# Patient Record
Sex: Male | Born: 1950 | Race: Black or African American | Hispanic: No | Marital: Married | Smoking: Never smoker
Health system: Southern US, Community
[De-identification: ages and names within clinical notes are randomized; demographics above are authoritative.]

## PROBLEM LIST (undated history)

## (undated) DIAGNOSIS — J302 Other seasonal allergic rhinitis: Secondary | ICD-10-CM

## (undated) DIAGNOSIS — E785 Hyperlipidemia, unspecified: Secondary | ICD-10-CM

## (undated) DIAGNOSIS — H35039 Hypertensive retinopathy, unspecified eye: Secondary | ICD-10-CM

## (undated) DIAGNOSIS — I10 Essential (primary) hypertension: Secondary | ICD-10-CM

## (undated) DIAGNOSIS — H269 Unspecified cataract: Secondary | ICD-10-CM

## (undated) DIAGNOSIS — N529 Male erectile dysfunction, unspecified: Secondary | ICD-10-CM

## (undated) HISTORY — DX: Essential (primary) hypertension: I10

## (undated) HISTORY — DX: Hypertensive retinopathy, unspecified eye: H35.039

## (undated) HISTORY — PX: KNEE ARTHROSCOPY: SUR90

## (undated) HISTORY — DX: Male erectile dysfunction, unspecified: N52.9

## (undated) HISTORY — DX: Hyperlipidemia, unspecified: E78.5

## (undated) HISTORY — DX: Unspecified cataract: H26.9

## (undated) NOTE — *Deleted (*Deleted)
[facility] - Clinic Note  02/17/2020     CHIEF COMPLAINT Patient presents for No chief complaint on file.   HISTORY OF PRESENT ILLNESS: Jeffrey Good is a 68 y.o. male who presents to the clinic today for:   pt started seeing new floaters in his right eye only about 3 weeks ago, he denies seeing any fol, pt saw Dr. Conley Rolls who said she saw blood in the back of his eye and referred him here, pt states he is on medication for BP  Referring physician: Deatra James, MD [phone] [address] [place],  [place] [address]  HISTORICAL INFORMATION:   Selected notes from the MEDICAL RECORD NUMBER Referred by Dr. Conley Rolls for eval of floaters/ischemia/heme OU.   CURRENT MEDICATIONS: No current outpatient medications on file. (Ophthalmic Drugs)   No current facility-administered medications for this visit. (Ophthalmic Drugs)   Current Outpatient Medications (Other)  Medication Sig  . acetaminophen (TYLENOL) 325 MG tablet Take 2 tablets (650 mg total) by mouth every 6 (six) hours as needed for mild pain (or temp > 100).  . carvedilol (COREG) 25 MG tablet Take 1 tablet (25 mg total) by mouth 2 (two) times daily.  . Cetirizine HCl (ZYRTEC ALLERGY) 10 MG CAPS Take 10 mg by mouth daily.   Marland Kitchen doxazosin (CARDURA XL) 4 MG 24 hr tablet Take 1 tablet (4 mg total) by mouth daily with breakfast.  . gabapentin (NEURONTIN) 300 MG capsule TAKE 1 CAPSULE BY MOUTH THREE TIMES A DAY  . HYDROcodone-acetaminophen (NORCO/VICODIN) 5-325 MG tablet Take 1-2 tablets by mouth as needed for moderate pain.  Marland Kitchen ibuprofen (ADVIL) 800 MG tablet Take 1 tablet (800 mg total) by mouth every 8 (eight) hours as needed.  . lamoTRIgine (LAMICTAL) 150 MG tablet TAKE 1 TABLET BY MOUTH TWICE A DAY  . lisinopril (ZESTRIL) 20 MG tablet Take 1 tablet (20 mg total) by mouth 2 (two) times daily.  . meloxicam (MOBIC) 7.5 MG tablet Take 1 tablet (7.5 mg total) by mouth daily.  . sildenafil (VIAGRA) 100 MG tablet One half  to one po prn before intercourse  . spironolactone (ALDACTONE) 25 MG tablet TAKE 1 TABLET BY MOUTH ONCE DAILY   No current facility-administered medications for this visit. (Other)      REVIEW OF SYSTEMS:    ALLERGIES Allergies  Allergen Reactions  . Pregabalin Nausea Only    PAST MEDICAL HISTORY Past Medical History:  Diagnosis Date  . Erectile dysfunction   . Hyperlipidemia   . Hypertension   . Seasonal allergies    Past Surgical History:  Procedure Laterality Date  . IR RADIOLOGIST EVAL & MGMT  07/16/2019  . IR RADIOLOGIST EVAL & MGMT  07/29/2019  . KNEE ARTHROSCOPY     1989 left  . LAPAROSCOPIC APPENDECTOMY N/A 10/15/2019   Procedure: LAPAROSCOPIC APPENDECTOMY;  Surgeon: Harriette Bouillon, MD;  Location: [facility] OR;  Service: General;  Laterality: N/A;    FAMILY HISTORY Family History  Problem Relation Age of Onset  . Hypertension Mother   . Diabetes Mother     SOCIAL HISTORY Social History   Tobacco Use  . Smoking status: Never Smoker  . Smokeless tobacco: Never Used  Vaping Use  . Vaping Use: Never used  Substance Use Topics  . Alcohol use: Yes    Alcohol/week: 1.0 standard drink    Types: 1 Cans of beer per week    Comment: very rare  . Drug use: No  OPHTHALMIC EXAM:  Not recorded     IMAGING AND PROCEDURES  Imaging and Procedures for 02/17/2020           ASSESSMENT/PLAN:    ICD-10-CM   1. Posterior vitreous detachment of right eye  H43.811   2. Retinal ischemia  H35.82   3. 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 Retinal edema  H35.81   5. Essential hypertension  I10   6. Hypertensive retinopathy of both eyes  H35.033   7. Combined forms of age-related cataract of both eyes  H25.813     1. PVD / vitreous syneresis OD  - 3 wk history of floaters OD, onset ~1st of Oct 2021  - Discussed findings and prognosis  - No RT or RD on 360 peripheral exam  - Reviewed s/s of RT/RD  - Strict return precautions  for any such RT/RD signs/symptoms  - f/u in 3-4 wks -- DFE/OCT  2,3. Retinal Ischemia, remote BRVO OD  - exam shows sclerotic vessels in SN peripheral quad  - FA 10.19.21 shows telangectasias w/ late leakge, vascular perfusion defects in superonasal peripheral quad -- suggestive of remote BRVO   - optic disc with nasal corkscrew vessel  - discussed findings, prognosis  - no retinal intervention indicated at this time, but pt may benefit from segmental PRP to areas of nonperfusion OD  - monitor  4. No retinal edema on exam or OCT  5,6. Hypertensive retinopathy OU - discussed importance of tight BP control - monitor  7. Mixed Cataract OU - The symptoms of cataract, surgical options, and treatments and risks were discussed with patient. - discussed diagnosis and progression - monitor  Ophthalmic Meds Ordered this visit:  No orders of the defined types were placed in this encounter.      No follow-ups on file.  There are no Patient Instructions on file for this visit.   Explained the diagnoses, plan, and follow up with the patient and they expressed understanding.  Patient expressed understanding of the importance of proper follow up care.   This document serves as a record of services personally performed by Karie Chimera, MD, PhD. It was created on their behalf by Annalee Genta, COMT. The creation of this record is the provider's dictation and/or activities during the visit.  Electronically signed by: Annalee Genta, COMT 02/16/20 10:07 AM     Karie Chimera, M.D., Ph.D. Diseases & Surgery of the Retina and Vitreous [facility]    Abbreviations: M myopia (nearsighted); A astigmatism; H hyperopia (farsighted); P presbyopia; Mrx spectacle prescription;  CTL contact lenses; OD right eye; OS left eye; OU both eyes  XT exotropia; ET esotropia; PEK punctate epithelial keratitis; PEE punctate epithelial erosions; DES dry eye syndrome; MGD meibomian gland  dysfunction; ATs artificial tears; PFAT's preservative free artificial tears; NSC nuclear sclerotic cataract; PSC posterior subcapsular cataract; ERM epi-retinal membrane; PVD posterior vitreous detachment; RD retinal detachment; DM diabetes mellitus; DR diabetic retinopathy; NPDR non-proliferative diabetic retinopathy; PDR proliferative diabetic retinopathy; CSME clinically significant macular edema; DME diabetic macular edema; dbh dot blot hemorrhages; CWS cotton wool spot; POAG primary open angle glaucoma; C/D cup-to-disc ratio; HVF humphrey visual field; GVF goldmann visual field; OCT optical coherence tomography; IOP intraocular pressure; BRVO Branch retinal vein occlusion; CRVO central retinal vein occlusion; CRAO central retinal artery occlusion; BRAO branch retinal artery occlusion; RT retinal tear; SB scleral buckle; PPV pars plana vitrectomy; VH Vitreous hemorrhage; PRP panretinal laser  photocoagulation; IVK intravitreal kenalog; VMT vitreomacular traction; MH Macular hole;  NVD neovascularization of the disc; NVE neovascularization elsewhere; AREDS age related eye disease study; ARMD age related macular degeneration; POAG primary open angle glaucoma; EBMD epithelial/anterior basement membrane dystrophy; ACIOL anterior chamber intraocular lens; IOL intraocular lens; PCIOL posterior chamber intraocular lens; Phaco/IOL phacoemulsification with intraocular lens placement; PRK photorefractive keratectomy; LASIK laser assisted in situ keratomileusis; HTN hypertension; DM diabetes mellitus; COPD chronic obstructive pulmonary disease 

---

## 2002-06-25 ENCOUNTER — Encounter: Payer: Self-pay | Admitting: Cardiovascular Disease

## 2003-05-28 ENCOUNTER — Encounter (INDEPENDENT_AMBULATORY_CARE_PROVIDER_SITE_OTHER): Payer: Self-pay | Admitting: *Deleted

## 2003-05-28 ENCOUNTER — Ambulatory Visit (HOSPITAL_COMMUNITY): Admission: RE | Admit: 2003-05-28 | Discharge: 2003-05-28 | Payer: Self-pay | Admitting: Gastroenterology

## 2009-12-08 ENCOUNTER — Ambulatory Visit: Payer: Self-pay | Admitting: Cardiovascular Disease

## 2010-07-06 ENCOUNTER — Telehealth: Payer: Self-pay | Admitting: Cardiovascular Disease

## 2010-07-06 ENCOUNTER — Encounter: Payer: Self-pay | Admitting: *Deleted

## 2010-07-06 DIAGNOSIS — I10 Essential (primary) hypertension: Secondary | ICD-10-CM | POA: Insufficient documentation

## 2010-07-06 DIAGNOSIS — N529 Male erectile dysfunction, unspecified: Secondary | ICD-10-CM | POA: Insufficient documentation

## 2010-07-06 MED ORDER — AMLODIPINE BESYLATE 10 MG PO TABS: 10.0000 mg | ORAL_TABLET | Freq: Every day | ORAL | Status: DC

## 2010-07-06 NOTE — Telephone Encounter (Signed)
norvasc 10mg  daily #90/3 refills [facility]; pt advised

## 2010-07-06 NOTE — Telephone Encounter (Signed)
PT SAID HAD CALLED AND ASK FOR REFILLS BUT ONE OF THE MEDS HE WAS TOLD NOT TO TAKE. HE IS NOT SURE WHICH ONE AND WHY. CHART PLACED IN YOUR BOX.

## 2010-08-14 ENCOUNTER — Encounter: Payer: Self-pay | Admitting: Cardiovascular Disease

## 2010-08-14 ENCOUNTER — Ambulatory Visit (INDEPENDENT_AMBULATORY_CARE_PROVIDER_SITE_OTHER): Payer: PRIVATE HEALTH INSURANCE | Admitting: Cardiovascular Disease

## 2010-08-14 VITALS — BP 118/74 | HR 72 | Wt 244.0 lb

## 2010-08-14 DIAGNOSIS — I10 Essential (primary) hypertension: Secondary | ICD-10-CM

## 2010-08-14 NOTE — Progress Notes (Signed)
Jeffrey Good Date of Birth  1950/07/19 [facility] [address]     [address] [place], [place]  [address] [phone]  Fax  [phone]  History of Present Illness:  Jeffrey Good is a middle-aged gentleman with history of hypertension and erectile dysfunction. He presents today for followup visit for his hypertension.  He has not had any episodes of chest pain or shortness breath. He's been trying to get some exercise.  Current Outpatient Prescriptions on File Prior to Visit  Medication Sig Dispense Refill  . carvedilol (COREG) 12.5 MG tablet Take 12.5 mg by mouth 2 (two) times daily with a meal.        . doxazosin (CARDURA) 4 MG tablet Take 4 mg by mouth at bedtime.        . hydrochlorothiazide 25 MG tablet Take 25 mg by mouth daily.        Marland Kitchen lisinopril (PRINIVIL,ZESTRIL) 40 MG tablet Take 40 mg by mouth daily.        . potassium chloride SA (K-DUR,KLOR-CON) 20 MEQ tablet Take 20 mEq by mouth 2 (two) times daily.        . tadalafil (CIALIS) 20 MG tablet Take 20 mg by mouth daily as needed.        Marland Kitchen DISCONTD: amLODipine (NORVASC) 10 MG tablet Take 1 tablet (10 mg total) by mouth daily.  90 tablet  3  . DISCONTD: simvastatin (ZOCOR) 40 MG tablet Take 40 mg by mouth at bedtime.          No Known Allergies  Past Medical History  Diagnosis Date  . Hypertension   . Erectile dysfunction   . Hyperlipidemia     Past Surgical History  Procedure Date  . Knee arthroscopy     1989 left    History  Smoking status  . Never Smoker   Smokeless tobacco  . Not on file    History  Alcohol Use  . 0.6 oz/week  . 1 Cans of beer per week    Family History  Problem Relation Age of Onset  . Hypertension Mother   . Diabetes Mother     Reviw of Systems:  Reviewed in the HPI.  All other systems are negative.  Physical Exam: BP 118/74  Pulse 72  Wt 244 lb (110.678 kg) The patient is alert and oriented x 3.  The mood and affect are normal.   The skin is warm and dry.  Color is normal.  The HEENT exam reveals that the sclera are nonicteric.  The mucous membranes are moist.  The carotids are 2+ without bruits.  There is no thyromegaly.  There is no JVD.  The lungs are clear.  The chest wall is non tender.  The heart exam reveals a regular rate with a normal S1 and S2.  There are no murmurs, gallops, or rubs.  The PMI is not displaced.   Abdominal exam reveals good bowel sounds.  There is no guarding or rebound.  There is no hepatosplenomegaly or tenderness.  There are no masses.  Exam of the legs reveal no clubbing, cyanosis, or edema.  The legs are without rashes.  The distal pulses are intact.  Cranial nerves II - XII are intact.  Motor and sensory functions are intact.  The gait is normal.  Assessment / Plan:

## 2010-08-14 NOTE — Assessment & Plan Note (Signed)
Jeffrey Good is doing very well from a cardiac standpoint. His blood pressure is well controlled. We'll continue with his same medications.

## 2010-08-23 ENCOUNTER — Telehealth: Payer: Self-pay | Admitting: Cardiovascular Disease

## 2010-08-23 NOTE — Telephone Encounter (Signed)
Wife is calling about the list of medications as compared to what he is taking from home.  [name] is just calling back to give you the information

## 2010-08-24 ENCOUNTER — Telehealth: Payer: Self-pay | Admitting: *Deleted

## 2010-08-24 MED ORDER — POTASSIUM CHLORIDE CRYS ER 20 MEQ PO TBCR: EXTENDED_RELEASE_TABLET | ORAL | Status: DC

## 2010-08-24 MED ORDER — CARVEDILOL 25 MG PO TABS: 25.0000 mg | ORAL_TABLET | Freq: Two times a day (BID) | ORAL | Status: DC

## 2010-08-24 NOTE — Telephone Encounter (Signed)
Wife update me with meds told her to stop and pull norvasc out of his bag of meds,  verbalized understanding. Change in doses recorded. Note the changes in coreg was 12.5 mg bid now 25mg  bid, no catapress since hosp dc, DC norvasc per last office visit. Alfonso Ramus RN

## 2010-08-25 NOTE — Op Note (Signed)
NAME:  Jeffrey Good, Jeffrey Good NO.:  [id number]   MEDICAL RECORD NO.:  [id number]                   PATIENT TYPE:  AMB   LOCATION:  ENDO                                 FACILITY:  [facility]   PHYSICIAN:  Graylin Shiver, M.D.                DATE OF BIRTH:  1950-11-16   DATE OF PROCEDURE:  05/28/2003  DATE OF DISCHARGE:                                 OPERATIVE REPORT   PROCEDURE:  Colonoscopy with polypectomy and biopsy.   INDICATIONS:  Rectal bleeding.   Informed consent was obtained after explanation of the risks of bleeding,  infection, and perforation.   PREMEDICATION:  Fentanyl 50 mcg IV, Versed 4 mg IV.   DESCRIPTION OF PROCEDURE:  With the patient in the left lateral decubitus  position, a rectal exam was performed.  No masses were felt.  The Olympus  colonoscope was inserted into the rectum and advanced around the colon to  the cecum.  Cecal landmarks were identified.  The cecum looked normal.  In  the midascending colon there was a 4-5 mm sessile polyp snared and removed  by snare cautery technique.  The cautery site looked good.  The polyp was  suctioned into the scope but we were not able to retrieve a specimen.  The  transverse colon looked normal, the descending colon looked normal, the  sigmoid looked normal.  In the rectum there were several small 2-3 mm polyps  biopsied off with cold forceps.  He tolerated the procedure well without  complications.   IMPRESSION:  Colon polyps.   PLAN:  The pathology will be checked.                                               Graylin Shiver, M.D.    Germain Osgood  D:  05/28/2003  T:  05/28/2003  Job:  [id number]   cc:   Vesta Mixer, M.D.  [address]  [place]  [place] [address]  Fax: [phone]

## 2010-10-09 ENCOUNTER — Telehealth: Payer: Self-pay | Admitting: Cardiovascular Disease

## 2010-10-09 MED ORDER — ATORVASTATIN CALCIUM 40 MG PO TABS: 40.0000 mg | ORAL_TABLET | Freq: Every day | ORAL | Status: DC

## 2010-10-09 NOTE — Telephone Encounter (Signed)
Called in because his insurance company told him that they would no longer be refilling his prescriptions through [facility] when his insurance changes on the first of next year and he wanted to change and have all of his [facility] at [facility] [address] [phone]. Please call back. I have pulled his chart.

## 2010-10-09 NOTE — Telephone Encounter (Signed)
Pt called and informed when [facility] needs [facility]

## 2010-11-20 ENCOUNTER — Telehealth: Payer: Self-pay | Admitting: Cardiovascular Disease

## 2010-11-20 MED ORDER — CARVEDILOL 25 MG PO TABS: 25.0000 mg | ORAL_TABLET | Freq: Two times a day (BID) | ORAL | Status: DC

## 2010-11-20 MED ORDER — HYDROCHLOROTHIAZIDE 25 MG PO TABS: 25.0000 mg | ORAL_TABLET | Freq: Every day | ORAL | Status: DC

## 2010-11-20 NOTE — Telephone Encounter (Signed)
Patient request refill. Done Jodette Izel Hochberg RN  

## 2010-11-20 NOTE — Telephone Encounter (Signed)
Wants to let you know that he is out of his medication and needs refills.

## 2010-12-04 ENCOUNTER — Telehealth: Payer: Self-pay | Admitting: Cardiovascular Disease

## 2010-12-04 MED ORDER — DOXAZOSIN MESYLATE 4 MG PO TABS: 4.0000 mg | ORAL_TABLET | Freq: Every day | ORAL | Status: DC

## 2010-12-04 NOTE — Telephone Encounter (Signed)
Error, message already sent and retrieved

## 2010-12-04 NOTE — Telephone Encounter (Signed)
Pt needs [facility] [address]  Please contact pharmacy to refill.  No need to call pt back.  He won't be able to pick it up until tomorrow.

## 2010-12-04 NOTE — Telephone Encounter (Signed)
Patient request refill. Done, Jodette Jaleena Viviani RN  

## 2010-12-04 NOTE — Telephone Encounter (Signed)
Looking for Chart °

## 2011-01-08 ENCOUNTER — Other Ambulatory Visit: Payer: Self-pay | Admitting: Cardiovascular Disease

## 2011-01-08 MED ORDER — LISINOPRIL 40 MG PO TABS: 40.0000 mg | ORAL_TABLET | Freq: Every day | ORAL | Status: DC

## 2011-04-04 ENCOUNTER — Other Ambulatory Visit: Payer: Self-pay | Admitting: *Deleted

## 2011-04-04 MED ORDER — ATORVASTATIN CALCIUM 40 MG PO TABS: 40.0000 mg | ORAL_TABLET | Freq: Every day | ORAL | Status: DC

## 2011-04-05 ENCOUNTER — Other Ambulatory Visit: Payer: Self-pay | Admitting: *Deleted

## 2011-05-21 ENCOUNTER — Other Ambulatory Visit: Payer: Self-pay | Admitting: *Deleted

## 2011-05-21 MED ORDER — CARVEDILOL 25 MG PO TABS: 25.0000 mg | ORAL_TABLET | Freq: Two times a day (BID) | ORAL | Status: DC

## 2011-05-21 MED ORDER — HYDROCHLOROTHIAZIDE 25 MG PO TABS: 25.0000 mg | ORAL_TABLET | Freq: Every day | ORAL | Status: DC

## 2011-05-29 ENCOUNTER — Other Ambulatory Visit: Payer: Self-pay | Admitting: *Deleted

## 2011-05-29 MED ORDER — DOXAZOSIN MESYLATE 4 MG PO TABS: 4.0000 mg | ORAL_TABLET | Freq: Every day | ORAL | Status: DC

## 2011-10-12 ENCOUNTER — Other Ambulatory Visit: Payer: Self-pay | Admitting: Cardiology

## 2011-10-12 MED ORDER — ATORVASTATIN CALCIUM 40 MG PO TABS: 40.0000 mg | ORAL_TABLET | Freq: Every day | ORAL | Status: DC

## 2011-11-22 ENCOUNTER — Other Ambulatory Visit: Payer: Self-pay | Admitting: Cardiovascular Disease

## 2011-11-22 MED ORDER — CARVEDILOL 25 MG PO TABS: 25.0000 mg | ORAL_TABLET | Freq: Two times a day (BID) | ORAL | Status: DC

## 2011-12-11 ENCOUNTER — Other Ambulatory Visit: Payer: Self-pay | Admitting: Cardiovascular Disease

## 2011-12-11 NOTE — Telephone Encounter (Signed)
Will give more refills after appointment Fax Received. Refill Completed. Joseluis Alessio Chowoe (R.M.A)

## 2011-12-24 ENCOUNTER — Other Ambulatory Visit: Payer: Self-pay | Admitting: Cardiovascular Disease

## 2011-12-24 NOTE — Telephone Encounter (Signed)
Pt needs appointment then refill can be made Fax Received. Refill Completed. Jeannie Mallinger Chowoe (R.M.A)   

## 2012-01-08 ENCOUNTER — Telehealth: Payer: Self-pay | Admitting: Cardiovascular Disease

## 2012-01-08 ENCOUNTER — Ambulatory Visit (INDEPENDENT_AMBULATORY_CARE_PROVIDER_SITE_OTHER): Payer: PRIVATE HEALTH INSURANCE | Admitting: Cardiovascular Disease

## 2012-01-08 ENCOUNTER — Telehealth: Payer: Self-pay | Admitting: *Deleted

## 2012-01-08 ENCOUNTER — Encounter: Payer: Self-pay | Admitting: Cardiovascular Disease

## 2012-01-08 VITALS — BP 156/108 | HR 64 | Ht 71.0 in | Wt 240.8 lb

## 2012-01-08 DIAGNOSIS — I10 Essential (primary) hypertension: Secondary | ICD-10-CM

## 2012-01-08 MED ORDER — NEBIVOLOL HCL 20 MG PO TABS: 1.0000 | ORAL_TABLET | Freq: Every day | ORAL | Status: DC

## 2012-01-08 MED ORDER — DOXAZOSIN MESYLATE 8 MG PO TABS: 8.0000 mg | ORAL_TABLET | Freq: Every day | ORAL | Status: DC

## 2012-01-08 MED ORDER — HYDROCHLOROTHIAZIDE 25 MG PO TABS: 25.0000 mg | ORAL_TABLET | Freq: Every day | ORAL | Status: DC

## 2012-01-08 MED ORDER — LISINOPRIL 40 MG PO TABS: 40.0000 mg | ORAL_TABLET | Freq: Every day | ORAL | Status: DC

## 2012-01-08 MED ORDER — CARVEDILOL 25 MG PO TABS: 25.0000 mg | ORAL_TABLET | Freq: Two times a day (BID) | ORAL | Status: DC

## 2012-01-08 NOTE — Telephone Encounter (Signed)
Pt called back stating he cant afford bystolic. I told him I will call him back after speaking with Dr Elease Hashimoto. Pt agreed to plan. Pt back on coreg and pt was informed and pharmacy called to update them. Pt will have a bmet on his nurse visit day also in 2 weeks.

## 2012-01-08 NOTE — Assessment & Plan Note (Addendum)
Jeffrey Good feels well. His blood pressure is moderately elevated. He admits to eating some extra salt foods such as hot dogs and bacon. I've given him information on the DASH diet. 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   We prescribed Bystolic 20 mg a day and he was to stop his carvedilol. He was also instructed to increase his Cardura. When [name] got home from the office he realized that he had not given [place] a great medication was. He was actually completely out of his lisinopril. He also went to the store and refused to take the bus stop because of the high price.  At this point I suspect that his blood pressures I. because he is not been taking his medications correctly. We will have him stick to a very low salt diet. We will have him resume his lisinopril and continue the carvedilol. He is to check his blood pressure daily and we'll check it again in the office in several weeks.   I will see him again in 6 months for followup office visit.

## 2012-01-08 NOTE — Telephone Encounter (Signed)
Pt was to stop coreg but was not on it  , pt said he was to stay on lisinopril but he is not taking that either, pls call [phone]

## 2012-01-08 NOTE — Patient Instructions (Addendum)
Your physician has recommended you make the following change in your medication:  1) stop coreg 2) start bystolic 20 mg daily 3) increase cardura to 8 mg daily  COME FOR A NURSE VISIT TO RECHECK BLOOD PRESSURE  Your physician wants you to follow-up in: 6 months  You will receive a reminder letter in the mail two months in advance. If you don't receive a letter, please call our office to schedule the follow-up appointment.   REDUCE HIGH SODIUM FOODS LIKE CANNED SOUP, GRAVY, SAUCES, READY PREPARED FOODS LIKE FROZEN FOODS; LEAN CUISINE, LASAGNA. BACON, SAUSAGE, LUNCH MEAT, FAST FOODS.Marland Kitchen    DASH Diet The DASH diet stands for "Dietary Approaches to Stop Hypertension." It is a healthy eating plan that has been shown to reduce high blood pressure (hypertension) in as little as 14 days, while also possibly providing other significant health benefits. These other health benefits include reducing the risk of breast cancer after menopause and reducing the risk of type 2 diabetes, heart disease, colon cancer, and stroke. Health benefits also include weight loss and slowing kidney failure in patients with chronic kidney disease.  DIET GUIDELINES  Limit salt (sodium). Your diet should contain less than 1500 mg of sodium daily.   Limit refined or processed carbohydrates. Your diet should include mostly whole grains. Desserts and added sugars should be used sparingly.   Include small amounts of heart-healthy fats. These types of fats include nuts, oils, and tub margarine. Limit saturated and trans fats. These fats have been shown to be harmful in the body.  CHOOSING FOODS  The following food groups are based on a 2000 calorie diet. See your Registered Dietitian for individual calorie needs. Grains and Grain Products (6 to 8 servings daily)  Eat More Often: Whole-wheat bread, brown rice, whole-grain or wheat pasta, quinoa, popcorn without added fat or salt (air popped).   Eat Less Often: White bread, white  pasta, white rice, cornbread.  Vegetables (4 to 5 servings daily)  Eat More Often: Fresh, frozen, and canned vegetables. Vegetables may be raw, steamed, roasted, or grilled with a minimal amount of fat.   Eat Less Often/Avoid: Creamed or fried vegetables. Vegetables in a cheese sauce.  Fruit (4 to 5 servings daily)  Eat More Often: All fresh, canned (in natural juice), or frozen fruits. Dried fruits without added sugar. One hundred percent fruit juice ( cup [237 mL] daily).   Eat Less Often: Dried fruits with added sugar. Canned fruit in light or heavy syrup.  [employer], Fish, and Poultry (2 servings or less daily. One serving is 3 to 4 oz [85-114 g]).  Eat More Often: Ninety percent or leaner ground beef, tenderloin, sirloin. Round cuts of beef, chicken breast, [place] breast. All fish. Grill, bake, or broil your meat. Nothing should be fried.   Eat Less Often/Avoid: Fatty cuts of meat, [place], or chicken leg, thigh, or wing. Fried cuts of meat or fish.  Dairy (2 to 3 servings)  Eat More Often: Low-fat or fat-free milk, low-fat plain or light yogurt, reduced-fat or part-skim cheese.   Eat Less Often/Avoid: Milk (whole, 2%, skim, or chocolate).Whole milk yogurt. Full-fat cheeses.  Nuts, Seeds, and Legumes (4 to 5 servings per week)  Eat More Often: All without added salt.   Eat Less Often/Avoid: Salted nuts and seeds, canned beans with added salt.  Fats and Sweets (limited)  Eat More Often: Vegetable oils, tub margarines without trans fats, sugar-free gelatin. Mayonnaise and salad dressings.   Eat Less Often/Avoid:  Coconut oils, palm oils, butter, stick margarine, cream, half and half, cookies, candy, pie.  FOR MORE INFORMATION The Dash Diet Eating Plan: www.dashdiet.org Document Released: 03/15/2011 Document Reviewed: 03/05/2011 [facility] Patient Information 2012 [place], [place].

## 2012-01-08 NOTE — Telephone Encounter (Signed)
Pt didn't know that coreg is carvedilol, pt knows to stop it, on pt list he stated he was taking lisinopril but he ran out awhile ago and is not on. I will discuss further with Dr Elease Hashimoto to advise pts medication. Pt to restart lisinopril and his bp will be reassessed on nurse visit. Pt advised and agreed to plan.

## 2012-01-08 NOTE — Progress Notes (Signed)
    Jeffrey Good Date of Birth  1951/01/04       [facility]    [place] [address]  [address], [address] [place], [place]  [address]   [place], [place]  [address] [phone]     [phone]   Fax  [phone]    Fax [phone]  Problem List: 1.  Hypertension  History of Present Illness:  Jeffrey Good is a 61 year old gentleman with a history of hypertension.  He insisted that he is taking all his medications. 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  Current Outpatient Prescriptions on File Prior to Visit  Medication Sig Dispense Refill  . atorvastatin (LIPITOR) 40 MG tablet TAKE 1 TABLET BY MOUTH AT BEDTIME  30 tablet  1  . carvedilol (COREG) 25 MG tablet Take 1 tablet (25 mg total) by mouth 2 (two) times daily with a meal.  60 tablet  1  . doxazosin (CARDURA) 4 MG tablet TAKE 1 TABLET BY MOUTH AT BEDTIME  30 tablet  0  . hydrochlorothiazide (HYDRODIURIL) 25 MG tablet TAKE 1 TABLET (25 MG TOTAL) BY MOUTH DAILY.  30 tablet  1  . lisinopril (PRINIVIL,ZESTRIL) 40 MG tablet Take 1 tablet (40 mg total) by mouth daily.  30 tablet  12  . potassium chloride SA (K-DUR,KLOR-CON) 20 MEQ tablet Take otc 595mg  two times a day.        No Known Allergies  Past Medical History  Diagnosis Date  . Hypertension   . Erectile dysfunction   . Hyperlipidemia     Past Surgical History  Procedure Date  . Knee arthroscopy     1989 left    History  Smoking status  . Never Smoker   Smokeless tobacco  . Not on file    History  Alcohol Use  . 0.6 oz/week  . 1 Cans of beer per week    Family History  Problem Relation Age of Onset  . Hypertension Mother   . Diabetes Mother     Reviw of Systems:  Reviewed in the HPI.  All other systems are negative.  Physical Exam: Blood pressure 156/108, pulse 64,  height 5\' 11"  (1.803 m), weight 240 lb 12.8 oz (109.226 kg). General: Well developed, well nourished, in no acute distress.  Head: Normocephalic, atraumatic, sclera non-icteric, mucus membranes are moist,   Neck: Supple. Carotids are 2 + without bruits. No JVD  Lungs: Clear bilaterally to auscultation.  Heart: regular rate.  normal  S1 S2. No murmurs, gallops or rubs.  Abdomen: Soft, non-tender, non-distended with normal bowel sounds. No hepatomegaly. No rebound/guarding. No masses.  Msk:  Strength and tone are normal  Extremities: No clubbing or cyanosis. No edema.  Distal pedal pulses are 2+ and equal bilaterally.  Neuro: Alert and oriented X 3. Moves all extremities spontaneously.  Psych:  Responds to questions appropriately with a normal affect.  ECG: Oct. 1, 2013- NSR at 64.  Nonspecific T-wave abnormalities.  Assessment / Plan:

## 2012-01-09 ENCOUNTER — Telehealth: Payer: Self-pay | Admitting: *Deleted

## 2012-01-09 NOTE — Telephone Encounter (Signed)
Discussed need to check bp daily, she said [facility] and [facility] or so, gave recommendation to get an omron bp cuff and she agreed to plan. Discussed reducing sodium intake and to call with any question or concern, Informed her of the importance of getting his bp under control through diet/ medications and exercise. She will let him know that with regular use of meds his bp may go low and we need to watch for this, she verbalized understanding.

## 2012-01-17 ENCOUNTER — Telehealth: Payer: Self-pay | Admitting: *Deleted

## 2012-01-17 NOTE — Telephone Encounter (Signed)
Pt had one bp reading, 134/78 per his wife, I asked that the pt get a few more readings and call back and leave a msg. She will pass information on to husband.

## 2012-01-21 ENCOUNTER — Telehealth: Payer: Self-pay | Admitting: Cardiovascular Disease

## 2012-01-21 NOTE — Telephone Encounter (Signed)
noted 

## 2012-01-21 NOTE — Telephone Encounter (Signed)
New problem:  Was told to call back with blood pressure reading at [facility]   10/10  -  133/78 10/11 -  127/75 10/12  - 127/80 10/13   -125/78

## 2012-01-23 ENCOUNTER — Other Ambulatory Visit (INDEPENDENT_AMBULATORY_CARE_PROVIDER_SITE_OTHER): Payer: PRIVATE HEALTH INSURANCE

## 2012-01-23 ENCOUNTER — Other Ambulatory Visit: Payer: Self-pay | Admitting: *Deleted

## 2012-01-23 ENCOUNTER — Ambulatory Visit (INDEPENDENT_AMBULATORY_CARE_PROVIDER_SITE_OTHER): Payer: PRIVATE HEALTH INSURANCE | Admitting: *Deleted

## 2012-01-23 DIAGNOSIS — I10 Essential (primary) hypertension: Secondary | ICD-10-CM

## 2012-01-23 DIAGNOSIS — E876 Hypokalemia: Secondary | ICD-10-CM

## 2012-01-23 LAB — BASIC METABOLIC PANEL
Calcium: 8.9 mg/dL (ref 8.4–10.5)
GFR: 95.06 mL/min (ref >60.00)
Sodium: 137 mEq/L (ref 135–145)

## 2012-01-23 MED ORDER — POTASSIUM CHLORIDE CRYS ER 10 MEQ PO TBCR: 10.0000 meq | EXTENDED_RELEASE_TABLET | Freq: Every day | ORAL | Status: DC

## 2012-01-23 NOTE — Progress Notes (Signed)
PT CAME IN FOR B/P  CHECK B/P  TODAY WAS 130/82  PER PT HAS BEEN MONITORING ON A DAILY BASIS  READINGS ARE  120'S/80'S  PT INSTRUCTED TO CONT TO MONITOR B/P  QOD  OR SO AND KEEP A LOG  IF NOTES  CONSISTENTLY HIGH READING OR LOW READINGS TO CALL OFFICE VERBALIZED UNDERSTANDING./CY

## 2012-01-23 NOTE — Telephone Encounter (Signed)
Low Potassium. New med started pt is aware, two week labs was set.

## 2012-01-31 ENCOUNTER — Other Ambulatory Visit: Payer: Self-pay | Admitting: *Deleted

## 2012-01-31 NOTE — Telephone Encounter (Signed)
Opened in Error.

## 2012-02-06 ENCOUNTER — Other Ambulatory Visit: Payer: PRIVATE HEALTH INSURANCE

## 2012-02-12 ENCOUNTER — Other Ambulatory Visit: Payer: Self-pay | Admitting: *Deleted

## 2012-02-12 DIAGNOSIS — I10 Essential (primary) hypertension: Secondary | ICD-10-CM

## 2012-02-12 MED ORDER — DOXAZOSIN MESYLATE 8 MG PO TABS: 8.0000 mg | ORAL_TABLET | Freq: Every day | ORAL | Status: DC

## 2012-02-12 MED ORDER — HYDROCHLOROTHIAZIDE 25 MG PO TABS: 25.0000 mg | ORAL_TABLET | Freq: Every day | ORAL | Status: DC

## 2012-02-12 NOTE — Telephone Encounter (Signed)
Fax Received. Refill Completed. Jeffrey Good (R.M.A)   

## 2012-02-12 NOTE — Telephone Encounter (Signed)
Pt needs appointment then refill can be made 

## 2012-02-14 ENCOUNTER — Other Ambulatory Visit: Payer: Self-pay | Admitting: *Deleted

## 2012-02-26 ENCOUNTER — Other Ambulatory Visit: Payer: Self-pay | Admitting: *Deleted

## 2012-02-26 MED ORDER — ATORVASTATIN CALCIUM 40 MG PO TABS: 40.0000 mg | ORAL_TABLET | Freq: Every day | ORAL | Status: DC

## 2012-02-28 ENCOUNTER — Other Ambulatory Visit: Payer: Self-pay | Admitting: *Deleted

## 2012-02-28 DIAGNOSIS — I10 Essential (primary) hypertension: Secondary | ICD-10-CM

## 2012-02-28 MED ORDER — HYDROCHLOROTHIAZIDE 25 MG PO TABS: 25.0000 mg | ORAL_TABLET | Freq: Every day | ORAL | Status: DC

## 2012-02-28 NOTE — Telephone Encounter (Signed)
Refill done.  

## 2012-04-10 ENCOUNTER — Telehealth: Payer: Self-pay | Admitting: Cardiovascular Disease

## 2012-04-22 ENCOUNTER — Telehealth: Payer: Self-pay | Admitting: Cardiovascular Disease

## 2012-04-22 NOTE — Telephone Encounter (Signed)
Called wife, we don't have either med's as samples anymore since both are generic, told to call with further need. Wife agreed to plan.

## 2012-04-22 NOTE — Telephone Encounter (Signed)
Pt needs samples of cholesterol med and the pill he takes at night she did not know what the name was but pt has new insurance and it will not take affect until March and he wants to know if we can give him samples until then

## 2012-04-30 ENCOUNTER — Telehealth: Payer: Self-pay | Admitting: Cardiovascular Disease

## 2012-04-30 DIAGNOSIS — E876 Hypokalemia: Secondary | ICD-10-CM

## 2012-04-30 DIAGNOSIS — I10 Essential (primary) hypertension: Secondary | ICD-10-CM

## 2012-04-30 MED ORDER — CARVEDILOL 25 MG PO TABS: 25.0000 mg | ORAL_TABLET | Freq: Two times a day (BID) | ORAL | Status: DC

## 2012-04-30 MED ORDER — HYDROCHLOROTHIAZIDE 25 MG PO TABS: 25.0000 mg | ORAL_TABLET | Freq: Every day | ORAL | Status: DC

## 2012-04-30 MED ORDER — DOXAZOSIN MESYLATE 8 MG PO TABS: 8.0000 mg | ORAL_TABLET | Freq: Every day | ORAL | Status: DC

## 2012-04-30 MED ORDER — POTASSIUM CHLORIDE CRYS ER 10 MEQ PO TBCR: 10.0000 meq | EXTENDED_RELEASE_TABLET | Freq: Every day | ORAL | Status: DC

## 2012-04-30 MED ORDER — LISINOPRIL 40 MG PO TABS: 40.0000 mg | ORAL_TABLET | Freq: Every day | ORAL | Status: DC

## 2012-04-30 MED ORDER — ATORVASTATIN CALCIUM 40 MG PO TABS: 40.0000 mg | ORAL_TABLET | Freq: Every day | ORAL | Status: DC

## 2012-04-30 NOTE — Telephone Encounter (Signed)
No samples for generic meds, pt informed

## 2012-04-30 NOTE — Telephone Encounter (Signed)
Pt told to call back with name of pharmacy,  [facility]

## 2012-04-30 NOTE — Telephone Encounter (Signed)
All scripts sent to [facility], pt aware.

## 2012-04-30 NOTE — Telephone Encounter (Signed)
New problem:   Samples of lipitor 40 mg  & carvediolol 25 mg  Doxazosin 8 mg.

## 2012-06-17 ENCOUNTER — Ambulatory Visit (INDEPENDENT_AMBULATORY_CARE_PROVIDER_SITE_OTHER): Payer: PRIVATE HEALTH INSURANCE | Admitting: Cardiovascular Disease

## 2012-06-17 ENCOUNTER — Encounter: Payer: Self-pay | Admitting: Cardiovascular Disease

## 2012-06-17 VITALS — BP 130/80 | HR 71 | Ht 70.0 in | Wt 228.0 lb

## 2012-06-17 DIAGNOSIS — I10 Essential (primary) hypertension: Secondary | ICD-10-CM

## 2012-06-17 MED ORDER — ATORVASTATIN CALCIUM 20 MG PO TABS: 20.0000 mg | ORAL_TABLET | Freq: Two times a day (BID) | ORAL | Status: DC

## 2012-06-17 MED ORDER — LISINOPRIL 20 MG PO TABS: 20.0000 mg | ORAL_TABLET | Freq: Two times a day (BID) | ORAL | Status: DC

## 2012-06-17 NOTE — Progress Notes (Signed)
    Jeffrey Good Date of Birth  [birth year]       [facility]    [place] [address]  [address] [place], [place]  [address]   [place], [place]  [address] [phone]     [phone]   Fax  [phone]    Fax [phone]  Problem List: 1.  Hypertension 2. Hyperlipidemia  History of Present Illness:  Jeffrey Good is a 62 year old gentleman with a history of hypertension.  He insisted that he is taking all his medications. 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  He has lost some weight and his BP has been well controlled.    Current Outpatient Prescriptions on File Prior to Visit  Medication Sig Dispense Refill  . atorvastatin (LIPITOR) 40 MG tablet Take 1 tablet (40 mg total) by mouth daily.  30 tablet  5  . carvedilol (COREG) 25 MG tablet Take 1 tablet (25 mg total) by mouth 2 (two) times daily.  60 tablet  5  . doxazosin (CARDURA) 8 MG tablet Take 1 tablet (8 mg total) by mouth at bedtime.  90 tablet  3  . hydrochlorothiazide (HYDRODIURIL) 25 MG tablet Take 1 tablet (25 mg total) by mouth daily.  90 tablet  3  . lisinopril (PRINIVIL,ZESTRIL) 40 MG tablet Take 1 tablet (40 mg total) by mouth daily.  30 tablet  12  . potassium chloride (K-DUR,KLOR-CON) 10 MEQ tablet Take 1 tablet (10 mEq total) by mouth daily.  90 tablet  3   No current facility-administered medications on file prior to visit.    No Known Allergies  Past Medical History  Diagnosis Date  . Hypertension   . Erectile dysfunction   . Hyperlipidemia     Past Surgical History  Procedure Laterality Date  . Knee arthroscopy      1989 left    History  Smoking status  . Never Smoker   Smokeless tobacco  . Not on file    History  Alcohol Use  . 0.6 oz/week  . 1 Cans of beer per week    Family History   Problem Relation Age of Onset  . Hypertension Mother   . Diabetes Mother     Reviw of Systems:  Reviewed in the HPI.  All other systems are negative.  Physical Exam: Blood pressure 130/80, pulse 71, height 5\' 10"  (1.778 m), weight 228 lb (103.42 kg), SpO2 99.00%. General: Well developed, well nourished, in no acute distress.  Head: Normocephalic, atraumatic, sclera non-icteric, mucus membranes are moist,   Neck: Supple. Carotids are 2 + without bruits. No JVD  Lungs: Clear bilaterally to auscultation.  Heart: regular rate.  normal  S1 S2. No murmurs, gallops or rubs.  Abdomen: Soft, non-tender, non-distended with normal bowel sounds. No hepatomegaly. No rebound/guarding. No masses.  Msk:  Strength and tone are normal  Extremities: No clubbing or cyanosis. No edema.  Distal pedal pulses are 2+ and equal bilaterally.  Neuro: Alert and oriented X 3. Moves all extremities spontaneously.  Psych:  Responds to questions appropriately with a normal affect.  ECG: Oct. 1, 2013- NSR at 64.  Nonspecific T-wave abnormalities.  Assessment / Plan:

## 2012-06-17 NOTE — Assessment & Plan Note (Signed)
Ambrosio is doing well.  His BP is well controlled.  Continue current meds

## 2012-06-17 NOTE — Patient Instructions (Addendum)
Your physician wants you to follow-up in: 6 MONTHS WITH EKG You will receive a reminder letter in the mail two months in advance. If you don't receive a letter, please call our office to schedule the follow-up appointment.  Your physician recommends that you return for a FASTING lipid profile: 6 MONTHS   Your physician recommends that you continue on your current medications as directed. Please refer to the Current Medication list given to you today.

## 2012-08-19 ENCOUNTER — Telehealth: Payer: Self-pay | Admitting: Cardiovascular Disease

## 2012-08-19 NOTE — Telephone Encounter (Signed)
New problem    Pharm[facility]) has dosage mixed up

## 2012-08-19 NOTE — Telephone Encounter (Signed)
WIFE states he has been mixed up on his meds, last two ov and lab results with phone calls///all reviewed, pt has been taking meds incorrectly, pt to be on K+ 10 meq daily and Cardura 8 mg daily, pt asked to come for labs this Friday 5/16 for bmet, wife thinks he has been taking more K+and less cardura than prescribed. I placed copies of last 2 AVS's in lab area for him to pick up and review.

## 2012-08-20 ENCOUNTER — Ambulatory Visit (INDEPENDENT_AMBULATORY_CARE_PROVIDER_SITE_OTHER): Payer: PRIVATE HEALTH INSURANCE | Admitting: Cardiovascular Disease

## 2012-08-20 DIAGNOSIS — I10 Essential (primary) hypertension: Secondary | ICD-10-CM

## 2012-08-21 ENCOUNTER — Other Ambulatory Visit: Payer: 59

## 2012-08-21 LAB — BASIC METABOLIC PANEL
BUN: 13 mg/dL (ref 6–23)
CO2: 31 mEq/L (ref 19–32)
Chloride: 100 mEq/L (ref 96–112)
Glucose, Bld: 84 mg/dL (ref 70–99)
Potassium: 3.4 mEq/L — ABNORMAL LOW (ref 3.5–5.1)

## 2012-08-22 ENCOUNTER — Other Ambulatory Visit: Payer: 59

## 2012-10-30 ENCOUNTER — Telehealth: Payer: Self-pay | Admitting: Cardiovascular Disease

## 2012-10-30 MED ORDER — CARVEDILOL 25 MG PO TABS: 25.0000 mg | ORAL_TABLET | Freq: Two times a day (BID) | ORAL | Status: DC

## 2012-10-30 NOTE — Telephone Encounter (Signed)
Per wife request from pharmacy has been sent 3 times but not completed. I called pharmacy/ he needs a refill of carvedilol, refill completed.

## 2012-10-30 NOTE — Telephone Encounter (Signed)
New problem    Pt has called regarding refill but per [facility] they still have not received anything from our office

## 2012-12-18 ENCOUNTER — Encounter: Payer: Self-pay | Admitting: Cardiovascular Disease

## 2012-12-18 ENCOUNTER — Ambulatory Visit (INDEPENDENT_AMBULATORY_CARE_PROVIDER_SITE_OTHER): Payer: PRIVATE HEALTH INSURANCE | Admitting: Cardiovascular Disease

## 2012-12-18 VITALS — BP 134/84 | HR 67 | Ht 71.0 in | Wt 240.0 lb

## 2012-12-18 DIAGNOSIS — I10 Essential (primary) hypertension: Secondary | ICD-10-CM

## 2012-12-18 NOTE — Assessment & Plan Note (Signed)
Bleu is doing very well. We'll continue with the same medications. His blood pressures been well controlled. He continues to stay active.

## 2012-12-18 NOTE — Patient Instructions (Addendum)
Your physician wants you to follow-up in: 6 MONTHS You will receive a reminder letter in the mail two months in advance. If you don't receive a letter, please call our office to schedule the follow-up appointment.  Your physician recommends that you return for a FASTING lipid profile: 6 MONTHS    
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
## 2013-04-30 ENCOUNTER — Other Ambulatory Visit: Payer: Self-pay | Admitting: Cardiovascular Disease

## 2013-05-07 ENCOUNTER — Telehealth: Payer: Self-pay | Admitting: *Deleted

## 2013-05-07 NOTE — Telephone Encounter (Signed)
Unable to leave a msg on home number/ pt needs a bmet soon. Last K+ was low.

## 2013-05-13 NOTE — Telephone Encounter (Signed)
Home phone/ no answer/ answer machine beeps as if it is full,

## 2013-05-31 ENCOUNTER — Other Ambulatory Visit: Payer: Self-pay | Admitting: Cardiovascular Disease

## 2013-06-03 ENCOUNTER — Other Ambulatory Visit: Payer: Self-pay

## 2013-06-03 MED ORDER — DOXAZOSIN MESYLATE 8 MG PO TABS
8.0000 mg | ORAL_TABLET | Freq: Every day | ORAL | Status: DC
Start: 2013-06-03 — End: 2013-08-28

## 2013-06-05 NOTE — Telephone Encounter (Signed)
Pt informed/ has app Monday.

## 2013-06-08 ENCOUNTER — Other Ambulatory Visit: Payer: PRIVATE HEALTH INSURANCE

## 2013-06-08 ENCOUNTER — Encounter: Payer: Self-pay | Admitting: Cardiovascular Disease

## 2013-06-08 ENCOUNTER — Ambulatory Visit: Payer: PRIVATE HEALTH INSURANCE | Admitting: Cardiovascular Disease

## 2013-06-08 ENCOUNTER — Ambulatory Visit (INDEPENDENT_AMBULATORY_CARE_PROVIDER_SITE_OTHER): Payer: PRIVATE HEALTH INSURANCE | Admitting: Cardiovascular Disease

## 2013-06-08 VITALS — BP 126/88 | HR 64 | Ht 71.0 in | Wt 252.1 lb

## 2013-06-08 DIAGNOSIS — I10 Essential (primary) hypertension: Secondary | ICD-10-CM

## 2013-06-08 DIAGNOSIS — Z79899 Other long term (current) drug therapy: Secondary | ICD-10-CM

## 2013-06-08 NOTE — Assessment & Plan Note (Signed)
Jeffrey Good is doing ok.   BP is well controlled.  We have checked a BMP today.    I'll see him in 6 months.

## 2013-06-08 NOTE — Patient Instructions (Signed)
Your physician wants you to follow-up in: 6 MONTHS.  You will receive a reminder letter in the mail two months in advance. If you don't receive a letter, please call our office to schedule the follow-up appointment.  Your physician recommends that you continue on your current medications as directed. Please refer to the Current Medication list given to you today.  
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
## 2013-06-09 LAB — BASIC METABOLIC PANEL
BUN: 10 mg/dL (ref 6–23)
CHLORIDE: 97 meq/L (ref 96–112)
CO2: 30 mEq/L (ref 19–32)
Calcium: 8.9 mg/dL (ref 8.4–10.5)
Creatinine, Ser: 1.1 mg/dL (ref 0.4–1.5)
GFR: 89.58 mL/min (ref >60.00)
GLUCOSE: 88 mg/dL (ref 70–99)
Potassium: 3.2 mEq/L — ABNORMAL LOW (ref 3.5–5.1)
Sodium: 134 mEq/L — ABNORMAL LOW (ref 135–145)

## 2013-06-10 ENCOUNTER — Telehealth: Payer: Self-pay | Admitting: *Deleted

## 2013-06-10 ENCOUNTER — Telehealth: Payer: Self-pay | Admitting: Cardiovascular Disease

## 2013-06-10 DIAGNOSIS — E876 Hypokalemia: Secondary | ICD-10-CM

## 2013-06-10 MED ORDER — POTASSIUM CHLORIDE ER 10 MEQ PO TBCR: EXTENDED_RELEASE_TABLET | ORAL | Status: DC

## 2013-06-10 NOTE — Telephone Encounter (Signed)
WIFE SPOKE WITH HUSBAND/ HE WAS TAKING KDUR 10 MEQ DAILY/ PT WILL NEED TO TAKE IT TWICE DAILY THEN, SHE VERBALIZED UNDERSTANDING, I SENT NEW SCRIPT IN.

## 2013-06-10 NOTE — Telephone Encounter (Signed)
New problem   Please call pt back concerning his potassium medication.

## 2013-06-10 NOTE — Telephone Encounter (Signed)
Message copied by Antony OdeaBRILEY, Praise Stennett J on Wed Jun 10, 2013  2:24 PM ------      Message from: Vesta MixerNAHSER, PHILIP J      Created: Tue Jun 09, 2013  3:57 PM       Double his potassium to BID.  I cannot tell if he is on 10 a day or 20 a day.        Recheck BMP in 2 weeks.       ------

## 2013-06-10 NOTE — Telephone Encounter (Signed)
Pt has been on OTC potassium tablets per wife, I explained he would need to take a lot of those to reach therapeutic levels. Pt needs prescription strength/ explained problems with k+ being to high or low. She verbalized understanding and 2 week lab date was given.

## 2013-06-25 ENCOUNTER — Other Ambulatory Visit: Payer: PRIVATE HEALTH INSURANCE

## 2013-06-29 ENCOUNTER — Other Ambulatory Visit: Payer: Self-pay | Admitting: Cardiovascular Disease

## 2013-06-30 ENCOUNTER — Other Ambulatory Visit: Payer: Self-pay | Admitting: *Deleted

## 2013-06-30 MED ORDER — ATORVASTATIN CALCIUM 20 MG PO TABS: 20.0000 mg | ORAL_TABLET | Freq: Two times a day (BID) | ORAL | Status: DC

## 2013-07-08 ENCOUNTER — Telehealth: Payer: Self-pay | Admitting: *Deleted

## 2013-07-08 NOTE — Telephone Encounter (Signed)
bmet still needed, app set again.

## 2013-07-13 ENCOUNTER — Other Ambulatory Visit (INDEPENDENT_AMBULATORY_CARE_PROVIDER_SITE_OTHER): Payer: PRIVATE HEALTH INSURANCE

## 2013-07-13 DIAGNOSIS — E876 Hypokalemia: Secondary | ICD-10-CM

## 2013-07-13 LAB — BASIC METABOLIC PANEL
BUN: 15 mg/dL (ref 6–23)
CO2: 30 mEq/L (ref 19–32)
CREATININE: 1.1 mg/dL (ref 0.4–1.5)
Calcium: 9 mg/dL (ref 8.4–10.5)
Chloride: 102 mEq/L (ref 96–112)
GFR: 92.51 mL/min (ref >60.00)
Glucose, Bld: 138 mg/dL — ABNORMAL HIGH (ref 70–99)
Potassium: 3.3 mEq/L — ABNORMAL LOW (ref 3.5–5.1)
Sodium: 138 mEq/L (ref 135–145)

## 2013-07-15 ENCOUNTER — Telehealth: Payer: Self-pay | Admitting: *Deleted

## 2013-07-15 DIAGNOSIS — E876 Hypokalemia: Secondary | ICD-10-CM

## 2013-07-15 MED ORDER — POTASSIUM CHLORIDE ER 10 MEQ PO TBCR: EXTENDED_RELEASE_TABLET | ORAL | Status: DC

## 2013-07-15 NOTE — Telephone Encounter (Signed)
Pt is currently taking k+ 10 meq BID/ pt was told to take one additional tablet, bmet in 3 weeks, app made 08/05/13. Per wife, he is taking k+as prescribed.

## 2013-07-15 NOTE — Telephone Encounter (Signed)
Message copied by Antony OdeaBRILEY, Zyriah Mask J on Wed Jul 15, 2013  2:15 PM ------      Message from: Vesta MixerNAHSER, PHILIP J      Created: Mon Jul 13, 2013  5:50 PM       Increase k-dur to 20 , recheck labs ------

## 2013-08-05 ENCOUNTER — Other Ambulatory Visit: Payer: PRIVATE HEALTH INSURANCE

## 2013-08-06 ENCOUNTER — Other Ambulatory Visit (INDEPENDENT_AMBULATORY_CARE_PROVIDER_SITE_OTHER): Payer: PRIVATE HEALTH INSURANCE

## 2013-08-06 DIAGNOSIS — E876 Hypokalemia: Secondary | ICD-10-CM

## 2013-08-07 LAB — BASIC METABOLIC PANEL
BUN: 14 mg/dL (ref 6–23)
CALCIUM: 9 mg/dL (ref 8.4–10.5)
CO2: 30 mEq/L (ref 19–32)
Chloride: 100 mEq/L (ref 96–112)
Creatinine, Ser: 1.2 mg/dL (ref 0.4–1.5)
GFR: 77.78 mL/min (ref >60.00)
GLUCOSE: 87 mg/dL (ref 70–99)
Potassium: 3.7 mEq/L (ref 3.5–5.1)
SODIUM: 137 meq/L (ref 135–145)

## 2013-08-28 ENCOUNTER — Other Ambulatory Visit: Payer: Self-pay | Admitting: Cardiovascular Disease

## 2013-11-30 ENCOUNTER — Other Ambulatory Visit: Payer: Self-pay | Admitting: Cardiovascular Disease

## 2013-12-30 ENCOUNTER — Other Ambulatory Visit: Payer: Self-pay | Admitting: *Deleted

## 2013-12-30 MED ORDER — HYDROCHLOROTHIAZIDE 25 MG PO TABS: ORAL_TABLET | ORAL | Status: DC

## 2013-12-30 MED ORDER — LISINOPRIL 20 MG PO TABS: ORAL_TABLET | ORAL | Status: DC

## 2013-12-30 MED ORDER — ATORVASTATIN CALCIUM 20 MG PO TABS: 20.0000 mg | ORAL_TABLET | Freq: Two times a day (BID) | ORAL | Status: DC

## 2013-12-30 MED ORDER — CARVEDILOL 25 MG PO TABS: ORAL_TABLET | ORAL | Status: DC

## 2014-02-01 ENCOUNTER — Encounter: Payer: Self-pay | Admitting: Cardiovascular Disease

## 2014-02-01 ENCOUNTER — Ambulatory Visit (INDEPENDENT_AMBULATORY_CARE_PROVIDER_SITE_OTHER): Payer: PRIVATE HEALTH INSURANCE | Admitting: Cardiovascular Disease

## 2014-02-01 VITALS — BP 126/88 | HR 56 | Ht 71.0 in | Wt 247.8 lb

## 2014-02-01 DIAGNOSIS — I10 Essential (primary) hypertension: Secondary | ICD-10-CM

## 2014-02-01 DIAGNOSIS — Z23 Encounter for immunization: Secondary | ICD-10-CM

## 2014-02-01 NOTE — Assessment & Plan Note (Signed)
Jeffrey Good has an elevated BP today - possibly because of some dietary indiscretion.   We'll have him decrease his salt intake. He'll keep a blood pressure log. I'll see him again in 6 months for followup visit.
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
## 2014-02-01 NOTE — Patient Instructions (Signed)
Your physician recommends that you continue on your current medications as directed. Please refer to the Current Medication list given to you today.  Your physician wants you to follow-up in: 6 months with Dr. Nahser.  You will receive a reminder letter in the mail two months in advance. If you don't receive a letter, please call our office to schedule the follow-up appointment.  

## 2014-03-01 ENCOUNTER — Other Ambulatory Visit: Payer: Self-pay | Admitting: Cardiovascular Disease

## 2014-03-03 NOTE — Telephone Encounter (Signed)
doxazosin (CARDURA) 8 MG tablet TAKE ONE TABLET BY MOUTH ONCE DAILY AT BEDTIME   Your physician recommends that you continue on your current medications as directed. Please refer to the Current Medication list given to you today Vesta MixerPhilip J Nahser, MD at 02/01/2014 5:04 PM

## 2014-03-31 ENCOUNTER — Other Ambulatory Visit: Payer: Self-pay | Admitting: Cardiovascular Disease

## 2014-03-31 MED ORDER — HYDROCHLOROTHIAZIDE 25 MG PO TABS: ORAL_TABLET | ORAL | Status: DC

## 2014-03-31 MED ORDER — ATORVASTATIN CALCIUM 20 MG PO TABS: 20.0000 mg | ORAL_TABLET | Freq: Two times a day (BID) | ORAL | Status: DC

## 2014-06-28 ENCOUNTER — Other Ambulatory Visit: Payer: Self-pay | Admitting: Cardiovascular Disease

## 2014-06-29 ENCOUNTER — Telehealth: Payer: Self-pay | Admitting: *Deleted

## 2014-06-30 NOTE — Telephone Encounter (Signed)
Refill

## 2014-07-27 ENCOUNTER — Other Ambulatory Visit: Payer: Self-pay | Admitting: Cardiovascular Disease

## 2014-08-06 ENCOUNTER — Ambulatory Visit (INDEPENDENT_AMBULATORY_CARE_PROVIDER_SITE_OTHER): Payer: PRIVATE HEALTH INSURANCE | Admitting: Cardiovascular Disease

## 2014-08-06 ENCOUNTER — Encounter: Payer: Self-pay | Admitting: Cardiovascular Disease

## 2014-08-06 VITALS — BP 110/84 | HR 89 | Ht 71.0 in | Wt 242.4 lb

## 2014-08-06 DIAGNOSIS — E785 Hyperlipidemia, unspecified: Secondary | ICD-10-CM | POA: Diagnosis not present

## 2014-08-06 DIAGNOSIS — I10 Essential (primary) hypertension: Secondary | ICD-10-CM | POA: Diagnosis not present

## 2014-08-06 DIAGNOSIS — Z13220 Encounter for screening for lipoid disorders: Secondary | ICD-10-CM

## 2014-08-06 NOTE — Patient Instructions (Signed)
Medication Instructions:  Your physician recommends that you continue on your current medications as directed. Please refer to the Current Medication list given to you today.   Labwork: Return next week for fasting cholesterol, liver, bmet  Testing/Procedures: None  Follow-Up: Your physician wants you to follow-up in: 6 months with Dr. Elease HashimotoNahser.  You will receive a reminder letter in the mail two months in advance. If you don't receive a letter, please call our office to schedule the follow-up appointment.   Any Other Special Instructions Will Be Listed Below (If Applicable).
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
## 2014-09-01 ENCOUNTER — Other Ambulatory Visit (INDEPENDENT_AMBULATORY_CARE_PROVIDER_SITE_OTHER): Payer: PRIVATE HEALTH INSURANCE | Admitting: *Deleted

## 2014-09-01 DIAGNOSIS — Z13220 Encounter for screening for lipoid disorders: Secondary | ICD-10-CM | POA: Diagnosis not present

## 2014-09-01 LAB — LIPID PANEL
CHOL/HDL RATIO: 4
CHOLESTEROL: 126 mg/dL (ref 0–200)
HDL: 34 mg/dL — ABNORMAL LOW (ref >39.00)
LDL Cholesterol: 71 mg/dL (ref 0–99)
NonHDL: 92
TRIGLYCERIDES: 103 mg/dL (ref 0.0–149.0)
VLDL: 20.6 mg/dL (ref 0.0–40.0)

## 2014-09-01 LAB — HEPATIC FUNCTION PANEL
ALT: 20 U/L (ref 0–53)
AST: 17 U/L (ref 0–37)
Albumin: 4.1 g/dL (ref 3.5–5.2)
Alkaline Phosphatase: 73 U/L (ref 39–117)
BILIRUBIN DIRECT: 0.1 mg/dL (ref 0.0–0.3)
TOTAL PROTEIN: 7.8 g/dL (ref 6.0–8.3)
Total Bilirubin: 0.4 mg/dL (ref 0.2–1.2)

## 2014-09-01 LAB — BASIC METABOLIC PANEL
BUN: 15 mg/dL (ref 6–23)
CO2: 29 mEq/L (ref 19–32)
Calcium: 9 mg/dL (ref 8.4–10.5)
Chloride: 102 mEq/L (ref 96–112)
Creatinine, Ser: 1.05 mg/dL (ref 0.40–1.50)
GFR: 92.16 mL/min (ref >60.00)
Glucose, Bld: 86 mg/dL (ref 70–99)
Potassium: 3.5 mEq/L (ref 3.5–5.1)
SODIUM: 137 meq/L (ref 135–145)

## 2014-09-27 ENCOUNTER — Other Ambulatory Visit: Payer: Self-pay | Admitting: Cardiovascular Disease

## 2014-09-28 ENCOUNTER — Other Ambulatory Visit: Payer: Self-pay | Admitting: Cardiovascular Disease

## 2014-09-28 MED ORDER — POTASSIUM CHLORIDE ER 10 MEQ PO TBCR: 10.0000 meq | EXTENDED_RELEASE_TABLET | Freq: Three times a day (TID) | ORAL | Status: DC

## 2014-10-04 DIAGNOSIS — G629 Polyneuropathy, unspecified: Secondary | ICD-10-CM | POA: Insufficient documentation

## 2014-11-16 ENCOUNTER — Encounter: Payer: Self-pay | Admitting: Neurology

## 2014-11-16 ENCOUNTER — Ambulatory Visit (INDEPENDENT_AMBULATORY_CARE_PROVIDER_SITE_OTHER): Payer: PRIVATE HEALTH INSURANCE | Admitting: Neurology

## 2014-11-16 VITALS — BP 122/60 | HR 64 | Resp 18 | Ht 71.0 in | Wt 240.8 lb

## 2014-11-16 DIAGNOSIS — G629 Polyneuropathy, unspecified: Secondary | ICD-10-CM

## 2014-11-16 DIAGNOSIS — E7800 Pure hypercholesterolemia, unspecified: Secondary | ICD-10-CM | POA: Insufficient documentation

## 2014-11-16 DIAGNOSIS — N529 Male erectile dysfunction, unspecified: Secondary | ICD-10-CM | POA: Diagnosis not present

## 2014-11-16 DIAGNOSIS — I10 Essential (primary) hypertension: Secondary | ICD-10-CM | POA: Insufficient documentation

## 2014-11-16 DIAGNOSIS — R208 Other disturbances of skin sensation: Secondary | ICD-10-CM | POA: Insufficient documentation

## 2014-11-16 MED ORDER — LAMOTRIGINE 100 MG PO TABS: 100.0000 mg | ORAL_TABLET | Freq: Every day | ORAL | Status: DC

## 2014-11-16 MED ORDER — LAMOTRIGINE 25 MG PO TABS: ORAL_TABLET | ORAL | Status: DC

## 2014-11-16 NOTE — Progress Notes (Signed)
[facility]  PATIENT: Jeffrey Good DOB: 11-15-1950  REFERRING DOCTOR OR PCP:  Donald Prose SOURCE: patient, records from Dr. Nancy Fetter  _________________________________   HISTORICAL  CHIEF COMPLAINT:  Chief Complaint  Patient presents with  . Edema    Sts.over the last 6-7 mos. he has had edema both great toes, and sts. feet get very hot.  He clarifies that this is not a burning pain, but feet just feel hot to the touch.   Sts. the only real discomfort he has is if his legs are hanging, he can feel tightness in his great toes. If his feet are on the floor, great toes are still swollen, but no discomfort.  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  Sts. he saw his pcp for same--he was unable to   . Peripheral Neuropathy    tolerate Lyrica after one week due to gi upset.  No real relief with Baclofen. He thinks he tried Gabapentin but was not able to tolerate it due to drowsiness./fim    HISTORY OF PRESENT ILLNESS:  I had the pleasure of seeing your patient, Jeffrey Good, at [facility] for neurologic consultation regarding his polyneuropathy and dysesthetic nerve pain.  Near the beginning of the year, he had the onset of a burning sensation in his feet.  He did not note any change in the color of most of the toes though the tip of the big toe did seem to be a mildly different color. Sometimes, he gets a sensation of swelling in the big toes. Initially, he was tried on Lyrica but had stomach upset and stopped. Then, he was tried on gabapentin. 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 He does not think he was on either of these medications long enough to know where the not he got a benefit.  He denies any change in his walking. He denies any weakness. He notes no difficulty with his balance. He has not had any bladder or bowel changes. He does not have  diabetes or other systemic illnesses except for hypertension.   He notes no change in seating.     A few years ago, he had some erectile dysfunction. He continues to experience mild ED but feels that it has not worsened this year.  He had some blood work done and the vitamin B12 and thyroid function was fine.   REVIEW OF SYSTEMS: Constitutional: No fevers, chills, sweats, or change in appetite Eyes: No visual changes, double vision, eye pain Ear, nose and throat: No hearing loss, ear pain, nasal congestion, sore throat Cardiovascular: No chest pain, palpitations Respiratory: No shortness of breath at rest or with exertion.   No wheezes GastrointestinaI: No nausea, vomiting, diarrhea, abdominal pain, fecal incontinence Genitourinary: No dysuria, urinary retention or frequency.  No nocturia.  He has mild ED. Musculoskeletal: No neck pain, back pain Integumentary: No rash, pruritus, skin lesions Neurological: as above Psychiatric: No depression at this time.  No anxiety Endocrine: No palpitations, diaphoresis, change in appetite, change in weigh or increased thirst Hematologic/Lymphatic: No anemia, purpura, petechiae. Allergic/Immunologic: No itchy/runny eyes, nasal congestion, recent allergic reactions, rashes  ALLERGIES: Allergies  Allergen Reactions  . Pregabalin Nausea Only    HOME MEDICATIONS:  Current outpatient prescriptions:  .  carvedilol (COREG) 25 MG tablet, TAKE ONE TABLET BY MOUTH TWICE DAILY, Disp: 180 tablet, Rfl: 2 .  Cetirizine  HCl (ZYRTEC ALLERGY) 10 MG CAPS, , Disp: , Rfl:  .  doxazosin (CARDURA) 8 MG tablet, TAKE ONE TABLET BY MOUTH AT BEDTIME, Disp: 90 tablet, Rfl: 2 .  hydrochlorothiazide (HYDRODIURIL) 25 MG tablet, 1 tablet, Disp: , Rfl:  .  lisinopril (PRINIVIL,ZESTRIL) 20 MG tablet, TAKE ONE TABLET BY MOUTH TWICE DAILY, Disp: 180 tablet, Rfl: 2 .  potassium chloride (K-DUR) 10 MEQ tablet, Take 1 tablet (10 mEq total) by mouth 3 (three) times daily with  meals., Disp: 90 tablet, Rfl: 3 .  [DISCONTINUED] potassium chloride (K-DUR,KLOR-CON) 10 MEQ tablet, Take 1 tablet (10 mEq total) by mouth daily., Disp: 90 tablet, Rfl: 3  PAST MEDICAL HISTORY: Past Medical History  Diagnosis Date  . Hypertension   . Erectile dysfunction   . Hyperlipidemia     PAST SURGICAL HISTORY: Past Surgical History  Procedure Laterality Date  . Knee arthroscopy      1989 left    FAMILY HISTORY: Family History  Problem Relation Age of Onset  . Hypertension Mother   . Diabetes Mother     SOCIAL HISTORY:  History   Social History  . Marital Status: Married    Spouse Name: N/A  . Number of Children: N/A  . Years of Education: N/A   Occupational History  . Not on file.   Social History Main Topics  . Smoking status: Never Smoker   . Smokeless tobacco: Not on file  . Alcohol Use: 0.6 oz/week    1 Cans of beer per week  . Drug Use: No  . Sexual Activity: Yes   Other Topics Concern  . Not on file   Social History Narrative     PHYSICAL EXAM  Filed Vitals:   11/16/14 1600  BP: 122/60  Pulse: 64  Resp: 18  Height: _0  (1.803 m)  Weight: 240 lb 12.8 oz (109.226 kg)    Body mass index is 33.6 kg/(m^2).   General: The patient is well-developed and well-nourished and in no acute distress  Eyes:  Funduscopic exam shows normal optic discs and retinal vessels.  Neck: The neck is supple, no carotid bruits are noted.  The neck is nontender.  Cardiovascular: The heart has a regular rate and rhythm with a normal S1 and S2. There were no murmurs, gallops or rubs. Lungs are clear to auscultation.  Skin: Extremities are without significant edema.  Musculoskeletal:  Back is nontender  Neurologic Exam  Mental status: The patient is alert and oriented x 3 at the time of the examination. The patient has apparent normal recent and remote memory, with an apparently normal attention span and concentration ability.   Speech is  normal.  Cranial nerves: Extraocular movements are full. Pupils are equal, round, and reactive to light and accomodation.  Visual fields are full.  Facial symmetry is present. There is good facial sensation to soft touch bilaterally.Facial strength is normal.  Trapezius and sternocleidomastoid strength is normal. No dysarthria is noted.  The tongue is midline, and the patient has symmetric elevation of the soft palate. No obvious hearing deficits are noted.  Motor:  Muscle bulk is normal.   Tone is normal. Strength is  5 / 5 in all 4 extremities.   Sensory: Sensory testing is intact to pinprick, soft touch and vibration sensation in arms and proximal legs.   Cold feels very cold in his feet/toes.   Normal vibration  Coordination: Cerebellar testing reveals good finger-nose-finger and heel-to-shin bilaterally.  Gait and station: Station is normal.  Gait is normal. Tandem gait is normal. Romberg is negative.   Reflexes: Deep tendon reflexes are symmetric and normal bilaterally.   Plantar responses are flexor.    DIAGNOSTIC DATA (LABS, IMAGING, TESTING) - I reviewed patient records, labs, notes, testing and imaging myself where available.  No results found for: WBC, HGB, HCT, MCV, PLT    Component Value Date/Time   NA 137 09/01/2014 1551   K 3.5 09/01/2014 1551   CL 102 09/01/2014 1551   CO2 29 09/01/2014 1551   GLUCOSE 86 09/01/2014 1551   BUN 15 09/01/2014 1551   CREATININE 1.05 09/01/2014 1551   CALCIUM 9.0 09/01/2014 1551   PROT 7.8 09/01/2014 1551   ALBUMIN 4.1 09/01/2014 1551   AST 17 09/01/2014 1551   ALT 20 09/01/2014 1551   ALKPHOS 73 09/01/2014 1551   BILITOT 0.4 09/01/2014 1551   Lab Results  Component Value Date   CHOL 126 09/01/2014   HDL 34.00* 09/01/2014   LDLCALC 71 09/01/2014   TRIG 103.0 09/01/2014   CHOLHDL 4 09/01/2014       ASSESSMENT AND PLAN  Polyneuropathy - Plan: Sedimentation rate, Multiple myeloma panel, serum, Cryoglobulin, ANA  w/Reflex  Dysesthesia - Plan: Sedimentation rate, Multiple myeloma panel, serum, Cryoglobulin, ANA w/Reflex  Erectile dysfunction, unspecified erectile dysfunction type   In summary, Jeffrey Good is a 64 year old man with a burning dysesthetic pain in his feet. Additionally, he has had some erectile dysfunction. 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 Diagnostically, these are difficult to test as EMG/NCV is often normal.  We do need to check some blood work for SPEP/IEF, cryoglobulinemia and vasculitis. If his symptoms worsen, I would consider biopsy to assess for amyloidosis and also check a NCV/EMG and consider a glucose tolerance test. To help with the symptoms, I will have him start lamotrigine 25 mg and titrate up to a dose of 100 mg by mouth twice a day over the next 5 weeks he will return to see me in 2 months. If he is not better by then, I would consider a tricyclic as they may also be of benefit. However, for her to start with the lamotrigine to avoid her likelihood of lethargy and worsening of mild ED that could occur with a tricyclic.  He is advised to call us sooner if he has new or worsening neurologic symptoms.    Marguis Mathieson A. Felecia Shelling, MD, PhD 10/16/1503, 6:97 PM Certified in Neurology, Clinical Neurophysiology, Sleep Medicine, Pain Medicine and Neuroimaging  [facility] [address], [place], [place] [address] [phone]

## 2014-11-16 NOTE — Patient Instructions (Signed)
The pharmacy has the prescription for lamotrigine 25 mg. Please take it as follows:  For 1 week take 1 pill once a day. The second week, take 1 pill twice a day. The third week, take 1 pill in the morning and 2 at bedtime or evening. The fourth week take 2 pills twice a day.   I have also give you a paper prescription for lamotrigine 100 mg tablets after the first 4 weeks you should take 100 mg twice a day.  Most people tolerate lamotrigine very well. Some will get a rash. If you do get a significant rash stop the medicine immediately and let us know.

## 2014-11-18 LAB — MULTIPLE MYELOMA PANEL, SERUM
ALBUMIN SERPL ELPH-MCNC: 3.6 g/dL (ref 2.9–4.4)
Albumin/Glob SerPl: 1 (ref 0.7–1.7)
Alpha 1: 0.2 g/dL (ref 0.0–0.4)
Alpha2 Glob SerPl Elph-Mcnc: 0.7 g/dL (ref 0.4–1.0)
B-Globulin SerPl Elph-Mcnc: 1.3 g/dL (ref 0.7–1.3)
Gamma Glob SerPl Elph-Mcnc: 1.5 g/dL (ref 0.4–1.8)
Globulin, Total: 3.7 g/dL (ref 2.2–3.9)
IGG (IMMUNOGLOBIN G), SERUM: 1636 mg/dL — AB (ref 700–1600)
IgA/Immunoglobulin A, Serum: 420 mg/dL (ref 61–437)
IgM (Immunoglobulin M), Srm: 44 mg/dL (ref 20–172)
TOTAL PROTEIN: 7.3 g/dL (ref 6.0–8.5)

## 2014-11-18 LAB — ANA W/REFLEX: Anti Nuclear Antibody(ANA): NEGATIVE

## 2014-11-18 LAB — CRYOGLOBULIN

## 2014-11-18 LAB — SEDIMENTATION RATE: Sed Rate: 30 mm/hr (ref 0–30)

## 2014-11-24 ENCOUNTER — Telehealth: Payer: Self-pay | Admitting: *Deleted

## 2014-11-24 NOTE — Telephone Encounter (Signed)
-----   Message from Richard A Sater, MD sent at 11/21/2014  9:58 AM EDT ----- Labs fairly normal.     

## 2014-11-24 NOTE — Telephone Encounter (Signed)
Patient returned your call.

## 2014-11-24 NOTE — Telephone Encounter (Signed)
LMTC./fim 

## 2014-11-24 NOTE — Telephone Encounter (Signed)
#   busy/fim 

## 2014-11-25 NOTE — Telephone Encounter (Signed)
I have spoken with Jeffrey Good this afternoon and per RAS, advised that labwork looked fairly normal.  He verbalized understanding of same/fim

## 2014-11-26 ENCOUNTER — Telehealth: Payer: Self-pay | Admitting: *Deleted

## 2014-11-26 NOTE — Telephone Encounter (Signed)
-----   Message from Asa Lente, MD sent at 11/21/2014  9:58 AM EDT ----- Labs fairly normal.

## 2014-11-26 NOTE — Telephone Encounter (Signed)
I spoke with Mr. Bommarito yesterday and per RAS, advised that labs done in our office were fairly normal.  He verbalized understanding of same/fim

## 2014-12-24 ENCOUNTER — Ambulatory Visit: Payer: PRIVATE HEALTH INSURANCE | Admitting: Neurology

## 2014-12-28 ENCOUNTER — Other Ambulatory Visit: Payer: Self-pay | Admitting: Cardiovascular Disease

## 2015-01-25 ENCOUNTER — Encounter: Payer: Self-pay | Admitting: Neurology

## 2015-01-25 ENCOUNTER — Ambulatory Visit (INDEPENDENT_AMBULATORY_CARE_PROVIDER_SITE_OTHER): Payer: PRIVATE HEALTH INSURANCE | Admitting: Neurology

## 2015-01-25 VITALS — BP 138/82 | HR 66 | Resp 16 | Ht 71.0 in | Wt 237.4 lb

## 2015-01-25 DIAGNOSIS — R208 Other disturbances of skin sensation: Secondary | ICD-10-CM | POA: Diagnosis not present

## 2015-01-25 DIAGNOSIS — N529 Male erectile dysfunction, unspecified: Secondary | ICD-10-CM | POA: Diagnosis not present

## 2015-01-25 DIAGNOSIS — G629 Polyneuropathy, unspecified: Secondary | ICD-10-CM | POA: Diagnosis not present

## 2015-01-25 NOTE — Progress Notes (Signed)
[facility]  PATIENT: Jeffrey Good DOB: 02-20-1951  REFERRING DOCTOR OR PCP:  Jeffrey Good SOURCE: patient, records from Dr. Nancy Fetter  _________________________________   HISTORICAL  CHIEF COMPLAINT:  Chief Complaint  Patient presents with  . Polyneuropathy    Sts. minimal relief of burning pain in both feet, with Lamictal.  He was not able to tolerate Lyrica (nausea), and not able to tolerate Gabapentin (excessive drowsiness.) /fim    HISTORY OF PRESENT ILLNESS:  Jeffrey Good is a 64 yo man with a burning hot sensation in his feet (mostly just the soles)  Early this year, he had the onset of a burning sensation in his feet.  He did not note any change in the color of most of the toes though the tip of the big toe did seem to be a mildly different color. Sometimes, he gets a sensation of swelling in the big toes. Initially, he was tried on Lyrica but had stomach upset and stopped. Then, he was tried on gabapentin. 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 He does not think he was on either of these medications long enough to know where the not he got a benefit.   ESR, cryoglobulins, SPEP/IEF and ANA were normal.    He denies any change in his walking. He denies any weakness. He notes no difficulty with his balance. He has not had any bladder or bowel changes. He does not have diabetes or other systemic illnesses except for hypertension.   He notes no change in seating.     He has mild ED but feels that it has not worsened this year.   REVIEW OF SYSTEMS: Constitutional: No fevers, chills, sweats, or change in appetite Eyes: No visual changes, double vision, eye pain Ear, nose and throat: No hearing loss, ear pain, nasal congestion, sore throat Cardiovascular: No chest pain, palpitations Respiratory: No shortness of breath at rest or with exertion.   No wheezes GastrointestinaI: No nausea, vomiting, diarrhea,  abdominal pain, fecal incontinence Genitourinary: No dysuria, urinary retention or frequency.  No nocturia.  He has mild ED. Musculoskeletal: No neck pain, back pain Integumentary: No rash, pruritus, skin lesions Neurological: as above Psychiatric: No depression at this time.  No anxiety Endocrine: No palpitations, diaphoresis, change in appetite, change in weigh or increased thirst Hematologic/Lymphatic: No anemia, purpura, petechiae.   ALLERGIES: Allergies  Allergen Reactions  . Pregabalin Nausea Only    HOME MEDICATIONS:  Current outpatient prescriptions:  .  carvedilol (COREG) 25 MG tablet, TAKE ONE TABLET BY MOUTH TWICE DAILY, Disp: 180 tablet, Rfl: 2 .  Cetirizine HCl (ZYRTEC ALLERGY) 10 MG CAPS, , Disp: , Rfl:  .  doxazosin (CARDURA) 8 MG tablet, TAKE ONE TABLET BY MOUTH AT BEDTIME., Disp: 90 tablet, Rfl: 0 .  hydrochlorothiazide (HYDRODIURIL) 25 MG tablet, 1 tablet, Disp: , Rfl:  .  lamoTRIgine (LAMICTAL) 100 MG tablet, Take 1 tablet (100 mg total) by mouth daily., Disp: 60 tablet, Rfl: 11 .  lisinopril (PRINIVIL,ZESTRIL) 20 MG tablet, TAKE ONE TABLET BY MOUTH TWICE DAILY, Disp: 180 tablet, Rfl: 2 .  potassium chloride (K-DUR) 10 MEQ tablet, Take 1 tablet (10 mEq total) by mouth 3 (three) times daily with meals., Disp: 90 tablet, Rfl: 3 .  lamoTRIgine (LAMICTAL) 25 MG tablet, 1 po qd x 7d, then 1 po bid x 7d, then 1 po tid x 7d then 2 po bid x 7 d (Patient not taking: Reported on  01/25/2015), Disp: 70 tablet, Rfl: 0 .  [DISCONTINUED] potassium chloride (K-DUR,KLOR-CON) 10 MEQ tablet, Take 1 tablet (10 mEq total) by mouth daily., Disp: 90 tablet, Rfl: 3  PAST MEDICAL HISTORY: Past Medical History  Diagnosis Date  . Hypertension   . Erectile dysfunction   . Hyperlipidemia     PAST SURGICAL HISTORY: Past Surgical History  Procedure Laterality Date  . Knee arthroscopy      1989 left    FAMILY HISTORY: Family History  Problem Relation Age of Onset  . Hypertension  Mother   . Diabetes Mother     SOCIAL HISTORY:  Social History   Social History  . Marital Status: Married    Spouse Name: N/A  . Number of Children: N/A  . Years of Education: N/A   Occupational History  . Not on file.   Social History Main Topics  . Smoking status: Never Smoker   . Smokeless tobacco: Not on file  . Alcohol Use: 0.6 oz/week    1 Cans of beer per week  . Drug Use: No  . Sexual Activity: Yes   Other Topics Concern  . Not on file   Social History Narrative     PHYSICAL EXAM  Filed Vitals:   01/25/15 1616  BP: 138/82  Pulse: 66  Resp: 16  Height: 5' 11"  (1.803 m)  Weight: 237 lb 6.4 oz (107.684 kg)    Body mass index is 33.13 kg/(m^2).   General: The patient is well-developed and well-nourished and in no acute distress  Eyes:  Funduscopic exam shows normal optic discs and retinal vessels.  Neck: The neck is supple, no carotid bruits are noted.  The neck is nontender.  Cardiovascular: The heart has a regular rate and rhythm with a normal S1 and S2. There were no murmurs, gallops or rubs. Lungs are clear to auscultation.  Skin: Extremities are without significant edema.  Musculoskeletal:  Back is nontender  Neurologic Exam  Mental status: The patient is alert and oriented x 3 at the time of the examination. The patient has apparent normal recent and remote memory, with an apparently normal attention span and concentration ability.   Speech is normal.  Cranial nerves: Extraocular movements are full. Pupils are equal, round, and reactive to light and accomodation.  Visual fields are full.  Facial symmetry is present. There is good facial sensation to soft touch bilaterally.Facial strength is normal.  Trapezius and sternocleidomastoid strength is normal. No dysarthria is noted.  The tongue is midline, and the patient has symmetric elevation of the soft palate. No obvious hearing deficits are noted.  Motor:  Muscle bulk is normal.   Tone is  normal. Strength is  5 / 5 in all 4 extremities.   Sensory: Sensory testing is intact to pinprick, soft touch and vibration sensation in arms and proximal legs.   Cold feels very cold in his feet/toes.   Normal vibration  Coordination: Cerebellar testing reveals good finger-nose-finger and heel-to-shin bilaterally.  Gait and station: Station is normal.   Gait is normal. Tandem gait is normal. Romberg is negative.   Reflexes: Deep tendon reflexes are symmetric and normal bilaterally.   Plantar responses are flexor.    DIAGNOSTIC DATA (LABS, IMAGING, TESTING) - I reviewed patient records, labs, notes, testing and imaging myself where available.  No results found for: WBC, HGB, HCT, MCV, PLT    Component Value Date/Time   NA 137 09/01/2014 1551   K 3.5 09/01/2014 1551   CL 102  09/01/2014 1551   CO2 29 09/01/2014 1551   GLUCOSE 86 09/01/2014 1551   BUN 15 09/01/2014 1551   CREATININE 1.05 09/01/2014 1551   CALCIUM 9.0 09/01/2014 1551   PROT 7.3 11/16/2014 1635   PROT 7.8 09/01/2014 1551   ALBUMIN 4.1 09/01/2014 1551   AST 17 09/01/2014 1551   ALT 20 09/01/2014 1551   ALKPHOS 73 09/01/2014 1551   BILITOT 0.4 09/01/2014 1551   Lab Results  Component Value Date   CHOL 126 09/01/2014   HDL 34.00* 09/01/2014   LDLCALC 71 09/01/2014   TRIG 103.0 09/01/2014   CHOLHDL 4 09/01/2014       ASSESSMENT AND PLAN  Polyneuropathy (HCC)  Dysesthesia  1.  Lamotrigine 100 mg po bid if not better, consider increasing dose further or changing to a TCA (would use nortriptyline as less likely to affect ED) 2.  NCV/EMG for polyneuropathy 3.  rtc prn new or worsening symptoms   Richard A. Felecia Shelling, MD, PhD 92/34/1443, 6:01 PM Certified in Neurology, Clinical Neurophysiology, Sleep Medicine, Pain Medicine and Neuroimaging  [facility] [address] [place], [place] [address] [phone]

## 2015-01-27 ENCOUNTER — Other Ambulatory Visit: Payer: Self-pay | Admitting: Cardiovascular Disease

## 2015-02-28 ENCOUNTER — Encounter: Payer: Self-pay | Admitting: Cardiovascular Disease

## 2015-02-28 ENCOUNTER — Ambulatory Visit (INDEPENDENT_AMBULATORY_CARE_PROVIDER_SITE_OTHER): Payer: PRIVATE HEALTH INSURANCE | Admitting: Cardiovascular Disease

## 2015-02-28 ENCOUNTER — Other Ambulatory Visit: Payer: Self-pay | Admitting: Cardiovascular Disease

## 2015-02-28 VITALS — BP 110/80 | HR 62 | Ht 71.0 in | Wt 236.8 lb

## 2015-02-28 DIAGNOSIS — E785 Hyperlipidemia, unspecified: Secondary | ICD-10-CM

## 2015-02-28 DIAGNOSIS — I10 Essential (primary) hypertension: Secondary | ICD-10-CM | POA: Diagnosis not present

## 2015-02-28 DIAGNOSIS — Z23 Encounter for immunization: Secondary | ICD-10-CM

## 2015-02-28 LAB — BASIC METABOLIC PANEL
BUN: 15 mg/dL (ref 7–25)
CHLORIDE: 100 mmol/L (ref 98–110)
CO2: 27 mmol/L (ref 20–31)
CREATININE: 1.01 mg/dL (ref 0.70–1.25)
Calcium: 9.1 mg/dL (ref 8.6–10.3)
GLUCOSE: 84 mg/dL (ref 65–99)
POTASSIUM: 3.3 mmol/L — AB (ref 3.5–5.3)
Sodium: 136 mmol/L (ref 135–146)

## 2015-02-28 NOTE — Patient Instructions (Signed)
Medication Instructions:  Your physician recommends that you continue on your current medications as directed. Please refer to the Current Medication list given to you today.   Labwork: TODAY - basic metabolic panel   Testing/Procedures: None Ordered   Follow-Up: Your physician wants you to follow-up in: 6 months with Dr. Nahser. You will receive a reminder letter in the mail two months in advance. If you don't receive a letter, please call our office to schedule the follow-up appointment.   If you need a refill on your cardiac medications before your next appointment, please call your pharmacy.   Thank you for choosing [facility]! Chosen Geske, RN [phone]    
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
## 2015-03-07 ENCOUNTER — Telehealth: Payer: Self-pay | Admitting: Nurse Practitioner

## 2015-03-07 DIAGNOSIS — E876 Hypokalemia: Secondary | ICD-10-CM

## 2015-03-07 MED ORDER — SPIRONOLACTONE 25 MG PO TABS: 25.0000 mg | ORAL_TABLET | Freq: Every day | ORAL | Status: DC

## 2015-03-07 NOTE — Telephone Encounter (Signed)
-----   Message from Vesta MixerPhilip J Nahser, MD sent at 03/01/2015  8:06 AM EST ----- Add Aldactone 25 mg a day  DC HCTZ DC Kdur  BMP  1 week

## 2015-03-07 NOTE — Telephone Encounter (Signed)
Reviewed lab results and plan of care with patient who verbalized understanding and agreement.  He repeated medication change instructions back to me and requested return lab appointment on Friday 12/9 due to his work schedule.  I advised him to call back with questions or concerns.

## 2015-03-10 ENCOUNTER — Encounter: Payer: PRIVATE HEALTH INSURANCE | Admitting: Neurology

## 2015-03-18 ENCOUNTER — Other Ambulatory Visit (INDEPENDENT_AMBULATORY_CARE_PROVIDER_SITE_OTHER): Payer: PRIVATE HEALTH INSURANCE

## 2015-03-18 ENCOUNTER — Ambulatory Visit: Payer: PRIVATE HEALTH INSURANCE | Admitting: Neurology

## 2015-03-18 DIAGNOSIS — E876 Hypokalemia: Secondary | ICD-10-CM | POA: Diagnosis not present

## 2015-03-18 LAB — BASIC METABOLIC PANEL
BUN: 15 mg/dL (ref 7–25)
CHLORIDE: 102 mmol/L (ref 98–110)
CO2: 28 mmol/L (ref 20–31)
Calcium: 9.4 mg/dL (ref 8.6–10.3)
Creat: 1.11 mg/dL (ref 0.70–1.25)
GLUCOSE: 88 mg/dL (ref 65–99)
POTASSIUM: 4.3 mmol/L (ref 3.5–5.3)
Sodium: 139 mmol/L (ref 135–146)

## 2015-03-31 ENCOUNTER — Other Ambulatory Visit: Payer: Self-pay | Admitting: Cardiovascular Disease

## 2015-04-07 ENCOUNTER — Ambulatory Visit (INDEPENDENT_AMBULATORY_CARE_PROVIDER_SITE_OTHER): Payer: Self-pay | Admitting: Neurology

## 2015-04-07 ENCOUNTER — Ambulatory Visit (INDEPENDENT_AMBULATORY_CARE_PROVIDER_SITE_OTHER): Payer: PRIVATE HEALTH INSURANCE | Admitting: Neurology

## 2015-04-07 ENCOUNTER — Encounter: Payer: Self-pay | Admitting: Neurology

## 2015-04-07 DIAGNOSIS — M25561 Pain in right knee: Secondary | ICD-10-CM

## 2015-04-07 DIAGNOSIS — Z0289 Encounter for other administrative examinations: Secondary | ICD-10-CM

## 2015-04-07 DIAGNOSIS — M5417 Radiculopathy, lumbosacral region: Secondary | ICD-10-CM | POA: Diagnosis not present

## 2015-04-07 DIAGNOSIS — M25569 Pain in unspecified knee: Secondary | ICD-10-CM | POA: Insufficient documentation

## 2015-04-07 DIAGNOSIS — R208 Other disturbances of skin sensation: Secondary | ICD-10-CM

## 2015-04-07 MED ORDER — TRAMADOL HCL 50 MG PO TABS: 50.0000 mg | ORAL_TABLET | Freq: Two times a day (BID) | ORAL | Status: DC

## 2015-04-07 NOTE — Progress Notes (Signed)
   STUDY DATE: @DATE @ PATIENT NAME: Jeffrey Good DOB: 09/24/1950 MRN: [id number]  HISTORY:  Jeffrey Good is a 64 year old man with right much greater than left dysesthetic foot pain. He describes the pain as burning.  NERVE CONDUCTION STUDIES:  Bilateral peroneal and posterior tibial motor responses were normal.   Bilateral sural sensory responses were normal. H reflex latencies were normal and symmetric.  EMG STUDIES:  Needle EMG of the right vastus medialis, tibialis anterior, peroneus longus, gastrocnemius, first dorsal interossei of the foot, and gluteus medius muscles was performed.  There were some wide and large polyphasic motor units that recruited neuropathically in the peroneus longus, gastrocnemius, FDIO, and to a lesser extent, in the gluteus medius muscle.     Other muscles tested were normal.  IMPRESSION:  This study was consistent with a mild chronic right S1 radiculopathy. There was no evidence of large fiber polyneuropathy.   Delsa Walder A. Epimenio FootSater, MD, PhD Certified in Neurology, Clinical Neurophysiology, Sleep Medicine, Pain Medicine and Neuroimaging  [facility] [address], [address] [place], [place] [address] [phone]  Clinical note: An MRI of the lumbar spine will be performed to assess for an etiology of the radiculopathy. Tramadol 50 mg #60 was prescribed to help with pain. -- RAS

## 2015-04-13 ENCOUNTER — Encounter: Payer: PRIVATE HEALTH INSURANCE | Admitting: Physician Assistant

## 2015-04-13 ENCOUNTER — Encounter: Payer: Self-pay | Admitting: Physician Assistant

## 2015-05-27 ENCOUNTER — Ambulatory Visit
Admission: RE | Admit: 2015-05-27 | Discharge: 2015-05-27 | Disposition: A | Discharge status: Home / Self Care | Payer: PRIVATE HEALTH INSURANCE | Source: Ambulatory Visit | Attending: Neurology | Admitting: Neurology

## 2015-05-27 DIAGNOSIS — M5417 Radiculopathy, lumbosacral region: Secondary | ICD-10-CM

## 2015-05-31 ENCOUNTER — Telehealth: Payer: Self-pay | Admitting: *Deleted

## 2015-05-31 NOTE — Telephone Encounter (Signed)
I have spoken with Mrs. Malay this morning and per RAS, advised that mri did not show any nerve root compression, but bulging disc in his lower back may be causing his pain; that if pain is no better, RAS can refer him to [facility] for an esi.  Mrs. Odonohue verbalized understanding of same, sts. will relay message to Mr. Lattanzio and call me back if they want esi/fim

## 2015-05-31 NOTE — Telephone Encounter (Signed)
-----   Message from Asa Lente, MD sent at 05/30/2015  3:45 PM EST ----- Please note that the MRI did not show any nerve root compression. However, disc protrusion at L5-S1 seems to touch the right S1 nerve root which may be causing his symptoms. If he is not better we can refer her for a right L5-S1 ESI.

## 2015-06-29 ENCOUNTER — Other Ambulatory Visit: Payer: Self-pay | Admitting: Cardiovascular Disease

## 2015-07-01 ENCOUNTER — Other Ambulatory Visit: Payer: Self-pay | Admitting: Cardiovascular Disease

## 2015-07-06 ENCOUNTER — Ambulatory Visit (INDEPENDENT_AMBULATORY_CARE_PROVIDER_SITE_OTHER): Payer: PRIVATE HEALTH INSURANCE | Admitting: Neurology

## 2015-07-06 ENCOUNTER — Encounter: Payer: Self-pay | Admitting: Neurology

## 2015-07-06 VITALS — BP 140/90 | HR 64 | Resp 16 | Ht 71.0 in | Wt 232.0 lb

## 2015-07-06 DIAGNOSIS — R208 Other disturbances of skin sensation: Secondary | ICD-10-CM | POA: Diagnosis not present

## 2015-07-06 DIAGNOSIS — G629 Polyneuropathy, unspecified: Secondary | ICD-10-CM

## 2015-07-06 DIAGNOSIS — M5417 Radiculopathy, lumbosacral region: Secondary | ICD-10-CM | POA: Diagnosis not present

## 2015-07-06 MED ORDER — LAMOTRIGINE 100 MG PO TABS: 100.0000 mg | ORAL_TABLET | Freq: Two times a day (BID) | ORAL | Status: DC

## 2015-07-06 MED ORDER — HYDROCODONE-ACETAMINOPHEN 5-325 MG PO TABS: 1.0000 | ORAL_TABLET | Freq: Four times a day (QID) | ORAL | Status: DC | PRN

## 2015-07-06 MED ORDER — HYDROCODONE-ACETAMINOPHEN 5-325 MG PO TABS: 1.0000 | ORAL_TABLET | Freq: Every day | ORAL | Status: DC | PRN

## 2015-07-06 NOTE — Progress Notes (Signed)
J;flawine t[iopja'l;msdmv,./ '[ol;.

## 2015-07-06 NOTE — Progress Notes (Signed)
[facility]  PATIENT: Jeffrey Good DOB: [birth year]  REFERRING DOCTOR OR PCP:  Donald Prose SOURCE: patient, records from Dr. Nancy Fetter  _________________________________   HISTORICAL  CHIEF COMPLAINT:  Chief Complaint  Patient presents with  . Peripheral Neuropathy    Sts. burning pain in bilat feet is about 50% better with Lamictal.  Doesn't think Tramadol helps.  Sts. pain is manageable until evening hours.  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  He is still walking over 2 miles most evenings/fim    HISTORY OF PRESENT ILLNESS:  Jeffrey Good is a 65 yo man with a burning hot sensation in his feet (mostly just the soles)  Dysesthesia:   The burning pain is about 50% better with lamotrigine 100 mg po qd.   He tolerates it well.    NCV/EMG was more c/w mild S1 chronic radiculopathy.        Early this year, he had the onset of a burning sensation in his feet.  He did not note any change in the color of most of the toes though the tip of the big toe did seem to be a mildly different color. Sometimes, he gets a sensation of swelling in the big toes. Initially, he was tried on Lyrica but had stomach upset and stopped. Then, he was tried on gabapentin. 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 He does not think he was on either of these medications long enough to know where the not he got a benefit.   ESR, cryoglobulins, SPEP/IEF and ANA were normal.    He denies any change in his walking. He denies any weakness. He notes no difficulty with his balance. He has not had any bladder or bowel changes. He does not have diabetes or other systemic illnesses except for hypertension.   He notes no change in seating.     He has mild ED but feels that it has not worsened this year.   REVIEW OF SYSTEMS: Constitutional: No fevers, chills, sweats, or change in appetite Eyes: No visual changes, double  vision, eye pain Ear, nose and throat: No hearing loss, ear pain, nasal congestion, sore throat Cardiovascular: No chest pain, palpitations Respiratory: No shortness of breath at rest or with exertion.   No wheezes GastrointestinaI: No nausea, vomiting, diarrhea, abdominal pain, fecal incontinence Genitourinary: No dysuria, urinary retention or frequency.  No nocturia.  He has mild ED. Musculoskeletal: No neck pain, back pain Integumentary: No rash, pruritus, skin lesions Neurological: as above Psychiatric: No depression at this time.  No anxiety Endocrine: No palpitations, diaphoresis, change in appetite, change in weigh or increased thirst Hematologic/Lymphatic: No anemia, purpura, petechiae.   ALLERGIES: Allergies  Allergen Reactions  . Pregabalin Nausea Only    HOME MEDICATIONS:  Current outpatient prescriptions:  .  carvedilol (COREG) 25 MG tablet, TAKE ONE TABLET BY MOUTH TWICE DAILY, Disp: 180 tablet, Rfl: 2 .  Cetirizine HCl (ZYRTEC ALLERGY) 10 MG CAPS, Take 10 mg by mouth daily. , Disp: , Rfl:  .  doxazosin (CARDURA) 8 MG tablet, Take 1 tablet (8 mg total) by mouth at bedtime., Disp: 90 tablet, Rfl: 0 .  lamoTRIgine (LAMICTAL) 100 MG tablet, Take 1 tablet (100 mg total) by mouth daily., Disp: 60 tablet, Rfl: 11 .  lisinopril (PRINIVIL,ZESTRIL) 20 MG tablet, TAKE ONE TABLET BY MOUTH TWICE DAILY, Disp: 180 tablet, Rfl: 0 .  spironolactone (ALDACTONE)  25 MG tablet, Take 1 tablet (25 mg total) by mouth daily., Disp: 90 tablet, Rfl: 3 .  traMADol (ULTRAM) 50 MG tablet, Take 1 tablet (50 mg total) by mouth 2 (two) times daily., Disp: 60 tablet, Rfl: 2 .  [DISCONTINUED] potassium chloride (K-DUR,KLOR-CON) 10 MEQ tablet, Take 1 tablet (10 mEq total) by mouth daily., Disp: 90 tablet, Rfl: 3  PAST MEDICAL HISTORY: Past Medical History  Diagnosis Date  . Hypertension   . Erectile dysfunction   . Hyperlipidemia     PAST SURGICAL HISTORY: Past Surgical History  Procedure  Laterality Date  . Knee arthroscopy      1989 left    FAMILY HISTORY: Family History  Problem Relation Age of Onset  . Hypertension Mother   . Diabetes Mother     SOCIAL HISTORY:  Social History   Social History  . Marital Status: Married    Spouse Name: N/A  . Number of Children: N/A  . Years of Education: N/A   Occupational History  . Not on file.   Social History Main Topics  . Smoking status: Never Smoker   . Smokeless tobacco: Not on file  . Alcohol Use: 0.6 oz/week    1 Cans of beer per week  . Drug Use: No  . Sexual Activity: Yes   Other Topics Concern  . Not on file   Social History Narrative     PHYSICAL EXAM  Filed Vitals:   07/06/15 1633  BP: 140/90  Pulse: 64  Resp: 16  Height: 5' 11"  (1.803 m)  Weight: 232 lb (105.235 kg)    Body mass index is 32.37 kg/(m^2).   General: The patient is well-developed and well-nourished and in no acute distress  Neurologic Exam  Mental status: The patient is alert and oriented x 3 at the time of the examination. The patient has apparent normal recent and remote memory, with an apparently normal attention span and concentration ability.   Speech is normal.  Cranial nerves: Extraocular movements are full.  .Facial strength is normal.  Trapezius and sternocleidomastoid strength is normal. No dysarthria is noted.  No obvious hearing deficits are noted.  Motor:  Muscle bulk is normal.   Tone is normal. Strength is  5 / 5 in all 4 extremities.   Sensory: Sensory testing is intact to pinprick, soft touch and vibration sensation in arms and proximal legs.   Reduced vibration n toes  Coordination: Cerebellar testing reveals good finger-nose-finger and heel-to-shin bilaterally.  Gait and station: Station is normal.   Gait is normal. Tandem gait is normal. Romberg is negative.   Reflexes: Deep tendon reflexes are symmetric and normal bilaterally.   Plantar responses are flexor.    DIAGNOSTIC DATA (LABS,  IMAGING, TESTING) - I reviewed patient records, labs, notes, testing and imaging myself where available.  No results found for: WBC, HGB, HCT, MCV, PLT    Component Value Date/Time   NA 139 03/18/2015 1443   K 4.3 03/18/2015 1443   CL 102 03/18/2015 1443   CO2 28 03/18/2015 1443   GLUCOSE 88 03/18/2015 1443   BUN 15 03/18/2015 1443   CREATININE 1.11 03/18/2015 1443   CREATININE 1.05 09/01/2014 1551   CALCIUM 9.4 03/18/2015 1443   PROT 7.3 11/16/2014 1635   PROT 7.8 09/01/2014 1551   ALBUMIN 4.1 09/01/2014 1551   AST 17 09/01/2014 1551   ALT 20 09/01/2014 1551   ALKPHOS 73 09/01/2014 1551   BILITOT 0.4 09/01/2014 1551   Lab Results  Component Value Date   CHOL 126 09/01/2014   HDL 34.00* 09/01/2014   LDLCALC 71 09/01/2014   TRIG 103.0 09/01/2014   CHOLHDL 4 09/01/2014       ASSESSMENT AND PLAN  Polyneuropathy (HCC)  Lumbosacral radiculopathy at S1  Dysesthesia   1.  Increase Lamotrigine to 100 mg po bid.     2.  Hydrocodone one po daily 2.   rtc 4 mos or sooner if  new or worsening symptoms   Derica Leiber A. Felecia Shelling, MD, PhD 07/20/8206, 1:38 PM Certified in Neurology, Clinical Neurophysiology, Sleep Medicine, Pain Medicine and Neuroimaging  [facility] [address], [place], [place] [address] [phone]

## 2015-07-23 ENCOUNTER — Other Ambulatory Visit: Payer: Self-pay | Admitting: Cardiovascular Disease

## 2015-08-10 ENCOUNTER — Telehealth: Payer: Self-pay | Admitting: Neurology

## 2015-08-10 MED ORDER — HYDROCODONE-ACETAMINOPHEN 5-325 MG PO TABS
1.0000 | ORAL_TABLET | Freq: Every day | ORAL | Status: DC | PRN
Start: 2015-08-10 — End: 2015-09-19

## 2015-08-10 NOTE — Telephone Encounter (Signed)
Hydrocodone rx. up front [facility]/fim 

## 2015-08-10 NOTE — Telephone Encounter (Signed)
Patient is calling to get a written Rx for medication HYDROcodone-acetaminophen (NORCO/VICODIN) 5-325 MG tablet. I advised the Rx will be ready in 24 hours unless the nurse advises otherwise.

## 2015-08-10 NOTE — Telephone Encounter (Signed)
Rx. awaiting RAS sig/fim 

## 2015-09-02 ENCOUNTER — Ambulatory Visit (INDEPENDENT_AMBULATORY_CARE_PROVIDER_SITE_OTHER): Payer: PRIVATE HEALTH INSURANCE | Admitting: Cardiovascular Disease

## 2015-09-02 ENCOUNTER — Encounter: Payer: Self-pay | Admitting: Cardiovascular Disease

## 2015-09-02 VITALS — BP 120/84 | HR 70 | Ht 71.0 in | Wt 229.8 lb

## 2015-09-02 DIAGNOSIS — I10 Essential (primary) hypertension: Secondary | ICD-10-CM | POA: Diagnosis not present

## 2015-09-02 NOTE — Patient Instructions (Addendum)
Consider Denice Paradisearon Tonasket ( physical therapist) for your back pain -[phone]   Medication Instructions:  Your physician recommends that you continue on your current medications as directed. Please refer to the Current Medication list given to you today.   Labwork: Your physician recommends that you return for lab work (Basic metabolic panel) in: 6 months at next office visit   Testing/Procedures: None Ordered   Follow-Up: Your physician wants you to follow-up in: 6 months with Dr. Elease HashimotoNahser.  You will receive a reminder letter in the mail two months in advance. If you don't receive a letter, please call our office to schedule the follow-up appointment.   If you need a refill on your cardiac medications before your next appointment, please call your pharmacy.   Thank you for choosing [facility]! Eligha BridegroomMichelle Swinyer, RN [phone]
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
## 2015-09-19 ENCOUNTER — Telehealth: Payer: Self-pay | Admitting: Neurology

## 2015-09-19 MED ORDER — HYDROCODONE-ACETAMINOPHEN 5-325 MG PO TABS: 1.0000 | ORAL_TABLET | Freq: Every day | ORAL | Status: DC | PRN

## 2015-09-19 NOTE — Telephone Encounter (Signed)
Patient requesting refill of HYDROcodone-acetaminophen (NORCO/VICODIN) 5-325 MG tablet  °Pharmacy: pick up ° °

## 2015-09-20 ENCOUNTER — Other Ambulatory Visit: Payer: Self-pay | Admitting: *Deleted

## 2015-09-20 NOTE — Telephone Encounter (Signed)
Script ready for pick up at front desk; pt aware.

## 2015-09-30 ENCOUNTER — Other Ambulatory Visit: Payer: Self-pay | Admitting: Cardiovascular Disease

## 2015-10-10 ENCOUNTER — Other Ambulatory Visit: Payer: Self-pay | Admitting: Cardiovascular Disease

## 2015-10-10 ENCOUNTER — Other Ambulatory Visit: Payer: Self-pay

## 2015-10-10 MED ORDER — LISINOPRIL 20 MG PO TABS: 20.0000 mg | ORAL_TABLET | Freq: Two times a day (BID) | ORAL | Status: DC

## 2015-10-25 ENCOUNTER — Telehealth: Payer: Self-pay | Admitting: Neurology

## 2015-10-25 MED ORDER — HYDROCODONE-ACETAMINOPHEN 5-325 MG PO TABS: 1.0000 | ORAL_TABLET | Freq: Every day | ORAL | Status: DC | PRN

## 2015-10-25 NOTE — Telephone Encounter (Signed)
Rx. awaiting RAS sig/fim 

## 2015-10-25 NOTE — Telephone Encounter (Signed)
Patient is calling to order a written Rx hydrocodone-acetaminophen 5-325 mg tablets.  Thanks! °

## 2015-10-25 NOTE — Telephone Encounter (Signed)
Hydrocodone rx. up front [facility]/fim 

## 2015-11-03 ENCOUNTER — Encounter: Payer: Self-pay | Admitting: Neurology

## 2015-11-03 ENCOUNTER — Ambulatory Visit (INDEPENDENT_AMBULATORY_CARE_PROVIDER_SITE_OTHER): Payer: PRIVATE HEALTH INSURANCE | Admitting: Neurology

## 2015-11-03 VITALS — BP 132/86 | HR 68 | Resp 16 | Ht 71.0 in | Wt 228.0 lb

## 2015-11-03 DIAGNOSIS — M545 Low back pain, unspecified: Secondary | ICD-10-CM | POA: Insufficient documentation

## 2015-11-03 DIAGNOSIS — M5417 Radiculopathy, lumbosacral region: Secondary | ICD-10-CM

## 2015-11-03 DIAGNOSIS — G629 Polyneuropathy, unspecified: Secondary | ICD-10-CM | POA: Diagnosis not present

## 2015-11-03 DIAGNOSIS — R208 Other disturbances of skin sensation: Secondary | ICD-10-CM | POA: Diagnosis not present

## 2015-11-03 MED ORDER — HYDROCODONE-ACETAMINOPHEN 5-325 MG PO TABS
1.0000 | ORAL_TABLET | Freq: Every day | ORAL | 0 refills | Status: DC | PRN
Start: 2015-11-03 — End: 2016-01-03

## 2015-11-03 MED ORDER — LAMOTRIGINE 150 MG PO TABS: 150.0000 mg | ORAL_TABLET | Freq: Two times a day (BID) | ORAL | 11 refills | Status: DC

## 2015-11-03 NOTE — Progress Notes (Signed)
[facility]  PATIENT: Jeffrey Good DOB: 03-Apr-1951  REFERRING DOCTOR OR PCP:  Donald Prose SOURCE: patient, records from Dr. Nancy Fetter  _________________________________   HISTORICAL  CHIEF COMPLAINT:  Chief Complaint  Patient presents with  . Lumbar Radiculopathy    Sts. pain in feet is some better since starting Lamictal and Hydrocodone.   Sts. he doesn't have to take Hydrocodone every day--depends on activity level./fim   . Dysesthesias    HISTORY OF PRESENT ILLNESS:  Jeffrey Good is a 65 yo man with burning dysesthesias, worse in his great toes.    At the last visit, we started lamotrigine. Lamotrigine plus hydrocodone has helped his pain. He feels he is more functional. The pain involves a larger part of his foot, most of the sole, in the past.    Currently, he is taking 100 mg twice a day of lamotrigine. He takes hydrocodone when he is on his feet more and pain is more severe  He is able to sleep well most nights.  He denies any change in his walking. He denies any weakness. He notes no difficulty with his balance. He has not had any bladder or bowel changes. He does not have diabetes or other systemic illnesses except for hypertension.   He notes no change in seating.     He notes mild LBP, worse if he drives a long time.    He has mild ED but feels it is stable and not bad enough to treat.      History of dysesthesia:     Early 2017, he had the onset of a burning sensation in his feet.  He did not note any change in the color of most of the toes though the tip of the big toe did seem to be a mildly different color. Sometimes, he gets a sensation of swelling in the big toes. Initially, he was tried on Lyrica but had stomach upset and stopped. Then, he was tried on gabapentin. 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 He does not think he was on either of these medications long enough to know where the not he got a  benefit.        Studies :    ESR, cryoglobulins, SPEP/IEF and ANA were normal.    An NCV/EMG was more consistent with mild S1 chronic radiculopathy. There was not evidence of a large fiber polyneuropathy, though a small fiber polyneuropathy is still possible.   REVIEW OF SYSTEMS: Constitutional: No fevers, chills, sweats, or change in appetite Eyes: No visual changes, double vision, eye pain Ear, nose and throat: No hearing loss, ear pain, nasal congestion, sore throat Cardiovascular: No chest pain, palpitations Respiratory: No shortness of breath at rest or with exertion.   No wheezes GastrointestinaI: No nausea, vomiting, diarrhea, abdominal pain, fecal incontinence Genitourinary: No dysuria, urinary retention or frequency.  No nocturia.  He has mild ED. Musculoskeletal: No neck pain, back pain Integumentary: No rash, pruritus, skin lesions Neurological: as above Psychiatric: No depression at this time.  No anxiety Endocrine: No palpitations, diaphoresis, change in appetite, change in weigh or increased thirst Hematologic/Lymphatic: No anemia, purpura, petechiae.   ALLERGIES: Allergies  Allergen Reactions  . Pregabalin Nausea Only    HOME MEDICATIONS:  Current Outpatient Prescriptions:  .  carvedilol (COREG) 25 MG tablet, TAKE ONE TABLET BY MOUTH TWICE DAILY, Disp: 180 tablet, Rfl: 1 .  Cetirizine HCl (ZYRTEC ALLERGY) 10 MG CAPS, Take  10 mg by mouth daily. , Disp: , Rfl:  .  doxazosin (CARDURA) 8 MG tablet, TAKE ONE TABLET BY MOUTH AT BEDTIME, Disp: 90 tablet, Rfl: 3 .  HYDROcodone-acetaminophen (NORCO/VICODIN) 5-325 MG tablet, Take 1 tablet by mouth daily as needed for moderate pain., Disp: 30 tablet, Rfl: 0 .  lamoTRIgine (LAMICTAL) 100 MG tablet, Take 1 tablet (100 mg total) by mouth 2 (two) times daily., Disp: 60 tablet, Rfl: 11 .  lisinopril (PRINIVIL,ZESTRIL) 20 MG tablet, Take 1 tablet (20 mg total) by mouth 2 (two) times daily., Disp: 180 tablet, Rfl: 2 .   spironolactone (ALDACTONE) 25 MG tablet, Take 1 tablet (25 mg total) by mouth daily., Disp: 90 tablet, Rfl: 3  PAST MEDICAL HISTORY: Past Medical History:  Diagnosis Date  . Erectile dysfunction   . Hyperlipidemia   . Hypertension     PAST SURGICAL HISTORY: Past Surgical History:  Procedure Laterality Date  . KNEE ARTHROSCOPY     1989 left    FAMILY HISTORY: Family History  Problem Relation Age of Onset  . Hypertension Mother   . Diabetes Mother     SOCIAL HISTORY:  Social History   Social History  . Marital status: Married    Spouse name: N/A  . Number of children: N/A  . Years of education: N/A   Occupational History  . Not on file.   Social History Main Topics  . Smoking status: Never Smoker  . Smokeless tobacco: Not on file  . Alcohol use 0.6 oz/week    1 Cans of beer per week  . Drug use: No  . Sexual activity: Yes   Other Topics Concern  . Not on file   Social History Narrative  . No narrative on file     PHYSICAL EXAM  Vitals:   11/03/15 1624  BP: 132/86  Pulse: 68  Resp: 16  Weight: 228 lb (103.4 kg)  Height: 5' 11"  (1.803 m)    Body mass index is 31.8 kg/m.   General: The patient is well-developed and well-nourished and in no acute distress  Neurologic Exam  Mental status: The patient is alert and oriented x 3 at the time of the examination. The patient has apparent normal recent and remote memory, with an apparently normal attention span and concentration ability.   Speech is normal.  Cranial nerves: Extraocular movements are full.  .Facial strength is normal.  Trapezius and sternocleidomastoid strength is normal. No dysarthria is noted.  No obvious hearing deficits are noted.  Motor:  Muscle bulk is normal.   Tone is normal. Strength is  5 / 5 in all 4 extremities.   Sensory: Sensory testing is intact to pinprick, soft touch and vibration sensation in arms and proximal legs.   He notes mild reduced vibration n  toes  Coordination: Cerebellar testing reveals good finger-nose-finger and heel-to-shin bilaterally.  Gait and station: Station is normal.   Gait is normal. Tandem gait is normal. Romberg is negative.   Reflexes: Deep tendon reflexes are symmetric and normal bilaterally.   Plantar responses are flexor.    DIAGNOSTIC DATA (LABS, IMAGING, TESTING) - I reviewed patient records, labs, notes, testing and imaging myself where available.  No results found for: WBC, HGB, HCT, MCV, PLT    Component Value Date/Time   NA 139 03/18/2015 1443   K 4.3 03/18/2015 1443   CL 102 03/18/2015 1443   CO2 28 03/18/2015 1443   GLUCOSE 88 03/18/2015 1443   BUN 15 03/18/2015 1443  CREATININE 1.11 03/18/2015 1443   CALCIUM 9.4 03/18/2015 1443   PROT 7.3 11/16/2014 1635   ALBUMIN 4.1 09/01/2014 1551   AST 17 09/01/2014 1551   ALT 20 09/01/2014 1551   ALKPHOS 73 09/01/2014 1551   BILITOT 0.4 09/01/2014 1551   Lab Results  Component Value Date   CHOL 126 09/01/2014   HDL 34.00 (L) 09/01/2014   LDLCALC 71 09/01/2014   TRIG 103.0 09/01/2014   CHOLHDL 4 09/01/2014       ASSESSMENT AND PLAN  Dysesthesia  Lumbosacral radiculopathy at S1  Polyneuropathy (HCC)  Midline low back pain without sciatica    1.  Increase Lamotrigine to 150 mg po bid.     unclear pain is from a I'll polyneuropathy or S1 radiculopathy a she has features of both 2.  Hydrocodone one po daily prn 2.  rtc 6 mos or sooner if  new or worsening symptoms   Richard A. Felecia Shelling, MD, PhD 08/08/7140, 3:20 PM Certified in Neurology, Clinical Neurophysiology, Sleep Medicine, Pain Medicine and Neuroimaging  [facility] [address], [place],  [address] [phone]

## 2016-01-03 ENCOUNTER — Other Ambulatory Visit: Payer: Self-pay | Admitting: Neurology

## 2016-01-03 MED ORDER — HYDROCODONE-ACETAMINOPHEN 5-325 MG PO TABS: 1.0000 | ORAL_TABLET | Freq: Every day | ORAL | 0 refills | Status: DC | PRN

## 2016-01-03 NOTE — Telephone Encounter (Signed)
Rx printed and put on Dr. Bonnita HollowSater's desk for signature

## 2016-01-03 NOTE — Telephone Encounter (Signed)
Hydrocodone rx. up front [facility]/fim 

## 2016-01-03 NOTE — Telephone Encounter (Signed)
Patient requesting refill of HYDROcodone-acetaminophen (NORCO/VICODIN) 5-325 MG tablet  °Pharmacy: pick up ° °

## 2016-01-29 ENCOUNTER — Other Ambulatory Visit: Payer: Self-pay | Admitting: Cardiovascular Disease

## 2016-02-01 ENCOUNTER — Telehealth: Payer: Self-pay | Admitting: Neurology

## 2016-02-01 NOTE — Telephone Encounter (Signed)
Patient called to request refill of HYDROcodone-acetaminophen (NORCO/VICODIN) 5-325 MG tablet °

## 2016-02-02 MED ORDER — HYDROCODONE-ACETAMINOPHEN 5-325 MG PO TABS: 1.0000 | ORAL_TABLET | Freq: Every day | ORAL | 0 refills | Status: DC | PRN

## 2016-02-02 NOTE — Telephone Encounter (Signed)
Rx. up front [facility]/fim 

## 2016-02-02 NOTE — Telephone Encounter (Signed)
Rx. awaiting YY sig, as RAS is ooo this week/fim 

## 2016-02-02 NOTE — Addendum Note (Signed)
Addended by: Candis SchatzMISENHEIMER, Rylynn Schoneman I on: 02/02/2016 08:47 AM   Modules accepted: Orders

## 2016-02-24 ENCOUNTER — Encounter: Payer: Self-pay | Admitting: Cardiovascular Disease

## 2016-02-27 ENCOUNTER — Other Ambulatory Visit: Payer: Self-pay | Admitting: Cardiovascular Disease

## 2016-03-06 ENCOUNTER — Encounter: Payer: Self-pay | Admitting: Cardiovascular Disease

## 2016-03-06 ENCOUNTER — Telehealth: Payer: Self-pay | Admitting: Neurology

## 2016-03-06 ENCOUNTER — Ambulatory Visit (INDEPENDENT_AMBULATORY_CARE_PROVIDER_SITE_OTHER): Payer: PRIVATE HEALTH INSURANCE | Admitting: Cardiovascular Disease

## 2016-03-06 VITALS — BP 126/80 | HR 57 | Ht 71.0 in | Wt 231.1 lb

## 2016-03-06 DIAGNOSIS — Z23 Encounter for immunization: Secondary | ICD-10-CM | POA: Diagnosis not present

## 2016-03-06 DIAGNOSIS — I10 Essential (primary) hypertension: Secondary | ICD-10-CM | POA: Diagnosis not present

## 2016-03-06 MED ORDER — HYDROCODONE-ACETAMINOPHEN 5-325 MG PO TABS: 1.0000 | ORAL_TABLET | Freq: Every day | ORAL | 0 refills | Status: DC | PRN

## 2016-03-06 NOTE — Telephone Encounter (Signed)
Hydrocodone rx. up front [facility]/fim 

## 2016-03-06 NOTE — Telephone Encounter (Signed)
Rx. awaiting RAS sig/fim 
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
## 2016-03-06 NOTE — Patient Instructions (Signed)
Medication Instructions:  Your physician recommends that you continue on your current medications as directed. Please refer to the Current Medication list given to you today.   Labwork: Your physician recommends that you return for lab work on:  Thursday Nov. 30 - come in anytime between 7:30 am and 4:30 pm  You do not have to fast for your blood work   Testing/Procedures: None Ordered   Follow-Up: Your physician wants you to follow-up in: 1 year with Dr. Elease HashimotoNahser.  You will receive a reminder letter in the mail two months in advance. If you don't receive a letter, please call our office to schedule the follow-up appointment.   If you need a refill on your cardiac medications before your next appointment, please call your pharmacy.   Thank you for choosing [facility]! Eligha BridegroomMichelle Amey Hossain, RN [phone]

## 2016-03-06 NOTE — Telephone Encounter (Signed)
Pt request refill for HYDROcodone-acetaminophen (NORCO/VICODIN) 5-325 MG tablet °

## 2016-03-08 ENCOUNTER — Other Ambulatory Visit: Payer: PRIVATE HEALTH INSURANCE

## 2016-03-08 NOTE — Telephone Encounter (Signed)
Pt called wanting to know if his RX for hydrocodone is ready for pick up. I advised him that per Faith, it is available for pick up at the front desk as of 03/06/16. Pt knows to bring photo ID and of clinic hours.

## 2016-04-03 ENCOUNTER — Other Ambulatory Visit: Payer: PRIVATE HEALTH INSURANCE

## 2016-04-04 ENCOUNTER — Telehealth: Payer: Self-pay | Admitting: Neurology

## 2016-04-04 MED ORDER — HYDROCODONE-ACETAMINOPHEN 5-325 MG PO TABS: 1.0000 | ORAL_TABLET | Freq: Every day | ORAL | 0 refills | Status: DC | PRN

## 2016-04-04 NOTE — Telephone Encounter (Signed)
Rx due, patient up to date on appts. Printed and waiting for signature

## 2016-04-04 NOTE — Telephone Encounter (Signed)
I spoke to patient and he is aware that Rx is at fd for p/u.

## 2016-04-04 NOTE — Telephone Encounter (Signed)
Pt requesting refill for HYDROcodone-acetaminophen (NORCO/VICODIN) 5-325 MG tablet.  ° °

## 2016-04-04 NOTE — Addendum Note (Signed)
Addended by: Crisoforo OxfordURNER, Vaiden Adames S on: 04/04/2016 09:32 AM   Modules accepted: Orders

## 2016-04-05 ENCOUNTER — Other Ambulatory Visit: Payer: PRIVATE HEALTH INSURANCE | Admitting: *Deleted

## 2016-04-05 DIAGNOSIS — I10 Essential (primary) hypertension: Secondary | ICD-10-CM

## 2016-04-06 LAB — BASIC METABOLIC PANEL
BUN: 8 mg/dL (ref 7–25)
CALCIUM: 8.7 mg/dL (ref 8.6–10.3)
CO2: 30 mmol/L (ref 20–31)
Chloride: 104 mmol/L (ref 98–110)
Creat: 1.16 mg/dL (ref 0.70–1.25)
Glucose, Bld: 84 mg/dL (ref 65–99)
POTASSIUM: 3.9 mmol/L (ref 3.5–5.3)
SODIUM: 140 mmol/L (ref 135–146)

## 2016-05-09 ENCOUNTER — Ambulatory Visit (INDEPENDENT_AMBULATORY_CARE_PROVIDER_SITE_OTHER): Payer: PRIVATE HEALTH INSURANCE | Admitting: Neurology

## 2016-05-09 ENCOUNTER — Encounter: Payer: Self-pay | Admitting: Neurology

## 2016-05-09 VITALS — BP 135/85 | HR 56 | Resp 16 | Ht 71.0 in | Wt 235.0 lb

## 2016-05-09 DIAGNOSIS — M5417 Radiculopathy, lumbosacral region: Secondary | ICD-10-CM | POA: Diagnosis not present

## 2016-05-09 DIAGNOSIS — N529 Male erectile dysfunction, unspecified: Secondary | ICD-10-CM | POA: Diagnosis not present

## 2016-05-09 DIAGNOSIS — G629 Polyneuropathy, unspecified: Secondary | ICD-10-CM

## 2016-05-09 DIAGNOSIS — R208 Other disturbances of skin sensation: Secondary | ICD-10-CM

## 2016-05-09 MED ORDER — SILDENAFIL CITRATE 100 MG PO TABS: ORAL_TABLET | ORAL | 5 refills | Status: DC

## 2016-05-09 MED ORDER — HYDROCODONE-ACETAMINOPHEN 5-325 MG PO TABS: ORAL_TABLET | ORAL | 0 refills | Status: DC

## 2016-05-09 MED ORDER — LAMOTRIGINE 150 MG PO TABS: 150.0000 mg | ORAL_TABLET | Freq: Two times a day (BID) | ORAL | 11 refills | Status: DC

## 2016-05-09 MED ORDER — MELOXICAM 7.5 MG PO TABS: 7.5000 mg | ORAL_TABLET | Freq: Every day | ORAL | 5 refills | Status: DC

## 2016-05-09 NOTE — Progress Notes (Signed)
[facility]  PATIENT: Jeffrey Good DOB: 10-21-1950  REFERRING DOCTOR OR PCP:  Donald Prose SOURCE: patient, records from Dr. Nancy Fetter  _________________________________   HISTORICAL  CHIEF COMPLAINT:  Chief Complaint  Patient presents with  . Back Pain    Sts. back pain and bilat foot pain/burning are improved but still present since increasing Lamictal to 149m bid.  Wlorse in the am and better thruout the day./fim    HISTORY OF PRESENT ILLNESS:  Jeffrey Howryis a 66yo man with burning dysesthesias and back pain.      He reports burning pain in both feet. At the last visit the lamotrigine dose was increased and he feels that the symptoms are better since he started the higher dose. He takes hydrocodone also with some benefit.   He feels he is more functional. Pain is worse in the bottom of the foot than the top.  Currently, he is taking 150 mg twice a day of lamotrigine. He also takes hydrocodone when he is on his feet more and pain is more severe  He notes mild LBP, that has worsened the past year.  MRI shows degenerative changes at L5-S1. Nerve conduction study / EMG showed a mild S1 chronic radiculopathy.  He feels he walks well. He does not have any weakness. Balance is fine.   He has not had any bladder or bowel changes. Etiology of the neuropathy is unknown. Lab work have been normal and he does not have diabetes or other systemic illnesses .      He has mild ED.  He feels it is worse than last year and we discussed treatments.    History of dysesthesia:     Early 2017, he had the onset of a burning sensation in his feet.  He did not note any change in the color of most of the toes though the tip of the big toe did seem to be a mildly different color. Sometimes, he gets a sensation of swelling in the big toes. Initially, he was tried on Lyrica but had stomach upset and stopped. Then, he was tried on gabapentin. 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 He does not think he was on either of these medications long enough to know where the not he got a benefit.  Lamotrigine combined with hydrocodone has helped best..        Studies :    ESR, cryoglobulins, SPEP/IEF and ANA were normal.    An NCV/EMG was more consistent with mild S1 chronic radiculopathy. There was not evidence of a large fiber polyneuropathy, though a small fiber polyneuropathy is still possible.   REVIEW OF SYSTEMS: Constitutional: No fevers, chills, sweats, or change in appetite Eyes: No visual changes, double vision, eye pain Ear, nose and throat: No hearing loss, ear pain, nasal congestion, sore throat Cardiovascular: No chest pain, palpitations Respiratory: No shortness of breath at rest or with exertion.   No wheezes GastrointestinaI: No nausea, vomiting, diarrhea, abdominal pain, fecal incontinence Genitourinary: No dysuria, urinary retention or frequency.  No nocturia.  He has mild ED. Musculoskeletal: No neck pain, back pain Integumentary: No rash, pruritus, skin lesions Neurological: as above Psychiatric: No depression at this time.  No anxiety Endocrine: No palpitations, diaphoresis, change in appetite, change in weigh or increased thirst Hematologic/Lymphatic: No anemia, purpura, petechiae.   ALLERGIES: Allergies  Allergen Reactions  . Pregabalin Nausea Only    HOME MEDICATIONS:  Current Outpatient Prescriptions:  .  carvedilol (COREG) 25 MG tablet, TAKE ONE TABLET BY MOUTH TWICE DAILY, Disp: 180 tablet, Rfl: 1 .  Cetirizine HCl (ZYRTEC ALLERGY) 10 MG CAPS, Take 10 mg by mouth daily. , Disp: , Rfl:  .  doxazosin (CARDURA) 8 MG tablet, TAKE ONE TABLET BY MOUTH AT BEDTIME, Disp: 90 tablet, Rfl: 3 .  HYDROcodone-acetaminophen (NORCO/VICODIN) 5-325 MG tablet, Take one or two po daily as needed, Disp: 45 tablet, Rfl: 0 .  lamoTRIgine (LAMICTAL) 150 MG tablet, Take 1 tablet (150 mg total) by mouth 2 (two) times  daily., Disp: 60 tablet, Rfl: 11 .  lisinopril (PRINIVIL,ZESTRIL) 20 MG tablet, Take 1 tablet (20 mg total) by mouth 2 (two) times daily., Disp: 180 tablet, Rfl: 2 .  spironolactone (ALDACTONE) 25 MG tablet, TAKE ONE TABLET BY MOUTH ONCE DAILY, Disp: 90 tablet, Rfl: 1 .  meloxicam (MOBIC) 7.5 MG tablet, Take 1 tablet (7.5 mg total) by mouth daily., Disp: 30 tablet, Rfl: 5 .  sildenafil (VIAGRA) 100 MG tablet, Take one half pill as needed, Disp: 6 tablet, Rfl: 5  PAST MEDICAL HISTORY: Past Medical History:  Diagnosis Date  . Erectile dysfunction   . Hyperlipidemia   . Hypertension     PAST SURGICAL HISTORY: Past Surgical History:  Procedure Laterality Date  . KNEE ARTHROSCOPY     1989 left    FAMILY HISTORY: Family History  Problem Relation Age of Onset  . Hypertension Mother   . Diabetes Mother     SOCIAL HISTORY:  Social History   Social History  . Marital status: Married    Spouse name: N/A  . Number of children: N/A  . Years of education: N/A   Occupational History  . Not on file.   Social History Main Topics  . Smoking status: Never Smoker  . Smokeless tobacco: Never Used  . Alcohol use 0.6 oz/week    1 Cans of beer per week  . Drug use: No  . Sexual activity: Yes   Other Topics Concern  . Not on file   Social History Narrative  . No narrative on file     PHYSICAL EXAM  Vitals:   05/09/16 1519  BP: 135/85  Pulse: (!) 56  Resp: 16  Weight: 235 lb (106.6 kg)  Height: 5' 11"  (1.803 m)    Body mass index is 32.78 kg/m.   General: The patient is well-developed and well-nourished and in no acute distress  Back:   The back is nontender. Range of motion is slightly reduced.  Neurologic Exam  Mental status: The patient is alert and oriented x 3 at the time of the examination. The patient has apparent normal recent and remote memory, with an apparently normal attention span and concentration ability.   Speech is normal.  Cranial nerves:  Extraocular movements are full.  Pupils react equally to light.  .Facial strength is normal.  Trapezius and sternocleidomastoid strength is normal. No dysarthria is noted.  No obvious hearing deficits are noted.  Motor:  Muscle bulk is normal.   Tone is normal. Strength is  5 / 5 in all 4 extremities.   No foot weakness.  Sensory: Sensory testing is intact to pinprick, soft touch and vibration sensation in arms and proximal legs.   He notes mild reduced vibration n toes.   Touch and temperature sensation are normal in the feet.  Coordination: Cerebellar testing reveals good finger-nose-finger and heel-to-shin bilaterally.  Gait and station: Station is normal.  Gait is normal. Tandem gait is normal. Romberg is negative.   Reflexes: Deep tendon reflexes are symmetric and normal bilaterally.   Plantar responses are flexor.    DIAGNOSTIC DATA (LABS, IMAGING, TESTING) - I reviewed patient records, labs, notes, testing and imaging myself where available.  No results found for: WBC, HGB, HCT, MCV, PLT    Component Value Date/Time   NA 140 04/05/2016 1408   K 3.9 04/05/2016 1408   CL 104 04/05/2016 1408   CO2 30 04/05/2016 1408   GLUCOSE 84 04/05/2016 1408   BUN 8 04/05/2016 1408   CREATININE 1.16 04/05/2016 1408   CALCIUM 8.7 04/05/2016 1408   PROT 7.3 11/16/2014 1635   ALBUMIN 4.1 09/01/2014 1551   AST 17 09/01/2014 1551   ALT 20 09/01/2014 1551   ALKPHOS 73 09/01/2014 1551   BILITOT 0.4 09/01/2014 1551   Lab Results  Component Value Date   CHOL 126 09/01/2014   HDL 34.00 (L) 09/01/2014   LDLCALC 71 09/01/2014   TRIG 103.0 09/01/2014   CHOLHDL 4 09/01/2014       ASSESSMENT AND PLAN  Polyneuropathy (HCC)  Lumbosacral radiculopathy at S1  Erectile dysfunction, unspecified erectile dysfunction type  Dysesthesia    1.  Continue lamotrigine 150 mg po bid.    Add meloxicam for back pain 2.  Continue hydrocodone one-two po daily prn 3.   viagra prn for ED 4.   rtc 6  mos or sooner if  new or worsening symptoms   Lew Prout A. Felecia Shelling, MD, PhD 0/37/0488, 8:91 PM Certified in Neurology, Clinical Neurophysiology, Sleep Medicine, Pain Medicine and Neuroimaging  [facility] [address] [place], [place] [address] [phone]

## 2016-06-25 ENCOUNTER — Other Ambulatory Visit: Payer: Self-pay | Admitting: Cardiovascular Disease

## 2016-07-05 ENCOUNTER — Telehealth: Payer: Self-pay | Admitting: Neurology

## 2016-07-05 MED ORDER — HYDROCODONE-ACETAMINOPHEN 5-325 MG PO TABS: ORAL_TABLET | ORAL | 0 refills | Status: DC

## 2016-07-05 NOTE — Telephone Encounter (Signed)
Patient called office requesting refill for HYDROcodone-acetaminophen (NORCO/VICODIN) 5-325 MG tablet. °

## 2016-07-05 NOTE — Telephone Encounter (Signed)
Rx. up front [facility]/fim 

## 2016-07-05 NOTE — Addendum Note (Signed)
Addended by: Candis SchatzMISENHEIMER, Drue Camera I on: 07/05/2016 11:33 AM   Modules accepted: Orders

## 2016-07-28 ENCOUNTER — Other Ambulatory Visit: Payer: Self-pay | Admitting: Cardiovascular Disease

## 2016-08-06 ENCOUNTER — Telehealth: Payer: Self-pay | Admitting: Neurology

## 2016-08-06 MED ORDER — HYDROCODONE-ACETAMINOPHEN 5-325 MG PO TABS: ORAL_TABLET | ORAL | 0 refills | Status: DC

## 2016-08-06 NOTE — Telephone Encounter (Signed)
Pt request refill for HYDROcodone-acetaminophen (NORCO/VICODIN) 5-325 MG tablet. Pt aware RX will be ready within 24hrs unless otherwise informed by RN

## 2016-08-06 NOTE — Telephone Encounter (Signed)
Rx. awaiting RAS sig/fim 

## 2016-08-06 NOTE — Telephone Encounter (Signed)
Rx. up front [facility]/fim 

## 2016-08-25 ENCOUNTER — Other Ambulatory Visit: Payer: Self-pay | Admitting: Cardiovascular Disease

## 2016-08-27 NOTE — Telephone Encounter (Signed)
Medication Detail    Disp Refills Start End   carvedilol (COREG) 25 MG tablet 180 tablet 1 07/30/2016    Sig: TAKE ONE TABLET BY MOUTH TWICE DAILY   E-Prescribing Status: Receipt confirmed by pharmacy (07/30/2016 10:56 AM EDT)   Pharmacy   [id number] - [place] ([place]), [place] - [address]

## 2016-08-28 ENCOUNTER — Other Ambulatory Visit: Payer: Self-pay | Admitting: Cardiovascular Disease

## 2016-09-06 ENCOUNTER — Telehealth: Payer: Self-pay | Admitting: Neurology

## 2016-09-06 MED ORDER — HYDROCODONE-ACETAMINOPHEN 5-325 MG PO TABS: ORAL_TABLET | ORAL | 0 refills | Status: DC

## 2016-09-06 NOTE — Telephone Encounter (Signed)
Rx signed and placed up front for pickup. 

## 2016-09-06 NOTE — Telephone Encounter (Signed)
Rx. awaiting RAS sig/fim 

## 2016-09-06 NOTE — Telephone Encounter (Signed)
Pt calling for refill of HYDROcodone-acetaminophen (NORCO/VICODIN) 5-325 MG tablet °

## 2016-09-06 NOTE — Addendum Note (Signed)
Addended by: Candis SchatzMISENHEIMER, Mycal Conde I on: 09/06/2016 04:14 PM   Modules accepted: Orders

## 2016-10-09 ENCOUNTER — Telehealth: Payer: Self-pay | Admitting: Neurology

## 2016-10-09 MED ORDER — HYDROCODONE-ACETAMINOPHEN 5-325 MG PO TABS: ORAL_TABLET | ORAL | 0 refills | Status: DC

## 2016-10-09 NOTE — Telephone Encounter (Signed)
Patient called office requesting refill for HYDROcodone-acetaminophen (NORCO/VICODIN) 5-325 MG tablet.  Patient advised office will be closed on 10/10/16.

## 2016-10-09 NOTE — Telephone Encounter (Signed)
Rx. awaiting RAS sig/fim 

## 2016-10-09 NOTE — Telephone Encounter (Signed)
Rx. up front [facility]/fim 

## 2016-10-09 NOTE — Addendum Note (Signed)
Addended by: Candis SchatzMISENHEIMER, Seyon Strader I on: 10/09/2016 02:50 PM   Modules accepted: Orders

## 2016-10-24 ENCOUNTER — Other Ambulatory Visit: Payer: Self-pay | Admitting: Cardiovascular Disease

## 2016-11-07 ENCOUNTER — Encounter: Payer: Self-pay | Admitting: Neurology

## 2016-11-07 ENCOUNTER — Ambulatory Visit (INDEPENDENT_AMBULATORY_CARE_PROVIDER_SITE_OTHER): Payer: PRIVATE HEALTH INSURANCE | Admitting: Neurology

## 2016-11-07 VITALS — BP 128/76 | HR 70 | Ht 71.0 in | Wt 227.5 lb

## 2016-11-07 DIAGNOSIS — M545 Low back pain, unspecified: Secondary | ICD-10-CM

## 2016-11-07 DIAGNOSIS — G629 Polyneuropathy, unspecified: Secondary | ICD-10-CM

## 2016-11-07 DIAGNOSIS — R208 Other disturbances of skin sensation: Secondary | ICD-10-CM | POA: Diagnosis not present

## 2016-11-07 DIAGNOSIS — N529 Male erectile dysfunction, unspecified: Secondary | ICD-10-CM | POA: Diagnosis not present

## 2016-11-07 DIAGNOSIS — M5417 Radiculopathy, lumbosacral region: Secondary | ICD-10-CM

## 2016-11-07 MED ORDER — LAMOTRIGINE 150 MG PO TABS: 150.0000 mg | ORAL_TABLET | Freq: Two times a day (BID) | ORAL | 11 refills | Status: DC

## 2016-11-07 MED ORDER — HYDROCODONE-ACETAMINOPHEN 5-325 MG PO TABS: ORAL_TABLET | ORAL | 0 refills | Status: DC

## 2016-11-07 MED ORDER — MELOXICAM 7.5 MG PO TABS: 7.5000 mg | ORAL_TABLET | Freq: Every day | ORAL | 5 refills | Status: DC

## 2016-11-07 MED ORDER — SILDENAFIL CITRATE 100 MG PO TABS: ORAL_TABLET | ORAL | 11 refills | Status: DC

## 2016-11-07 NOTE — Progress Notes (Signed)
[facility]  PATIENT: Jeffrey Good DOB: [birth year]  REFERRING DOCTOR OR PCP:  Donald Prose SOURCE: patient, records from Dr. Nancy Fetter  _________________________________   HISTORICAL  CHIEF COMPLAINT:  Chief Complaint  Patient presents with  . Back Pain    Sts. back/leg pain is about the same--worse with prolonged sitting.  Thinks Meloxicam helps and only takes it prn./fim    HISTORY OF PRESENT ILLNESS:  Jeffrey Good is a 66 yo man with burning dysesthesias and back pain.      Dysesthetic Pain:   He has a burning pain in both feet. He feels that this is doing better on lamotrigine 150 mg twice a day that on a lower dose. There are still some days that hurt more and hydrocodone will help. Pain is worse on the bottom of the foot than the top of the foot. He does not note any weakness. He does not feel that this has worsened.   He feels he walks well. He does not have any weakness. Balance is fine.   He has not had any bladder or bowel changes. Etiology of the neuropathy is unknown. Lab work have been normal and he does not have diabetes or other systemic illnesses .       LBP:    LBP is doing better.  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  MRI shows degenerative changes at L5-S1. Nerve conduction study / EMG showed a mild S1 chronic radiculopathy.  ED:   He reports erectile dysfunction that is helped by Viagra.    History of dysesthesia:     Early 2017, he had the onset of a burning sensation in his feet.  He did not note any change in the color of most of the toes though the tip of the big toe did seem to be a mildly different color. Sometimes, he gets a sensation of swelling in the big toes. Initially, he was tried on Lyrica but had stomach upset and stopped. Then, he was tried on gabapentin. 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 He does  not think he was on either of these medications long enough to know where the not he got a benefit.  Lamotrigine combined with hydrocodone has helped best..        Studies :    ESR, cryoglobulins, SPEP/IEF and ANA were normal.    An NCV/EMG was more consistent with mild S1 chronic radiculopathy. There was not evidence of a large fiber polyneuropathy, though a small fiber polyneuropathy is still possible.   REVIEW OF SYSTEMS: Constitutional: No fevers, chills, sweats, or change in appetite Eyes: No visual changes, double vision, eye pain Ear, nose and throat: No hearing loss, ear pain, nasal congestion, sore throat Cardiovascular: No chest pain, palpitations Respiratory: No shortness of breath at rest or with exertion.   No wheezes GastrointestinaI: No nausea, vomiting, diarrhea, abdominal pain, fecal incontinence Genitourinary: No dysuria, urinary retention or frequency.  No nocturia.  He has mild ED. Musculoskeletal: No neck pain, back pain Integumentary: No rash, pruritus, skin lesions Neurological: as above Psychiatric: No depression at this time.  No anxiety Endocrine: No palpitations, diaphoresis, change in appetite, change in weigh or increased thirst Hematologic/Lymphatic: No anemia, purpura, petechiae.   ALLERGIES: Allergies  Allergen Reactions  . Pregabalin Nausea Only    HOME MEDICATIONS:  Current Outpatient Prescriptions:  .  carvedilol (COREG) 25 MG tablet, TAKE ONE TABLET BY MOUTH TWICE DAILY, Disp: 180 tablet, Rfl: 1 .  Cetirizine HCl (ZYRTEC ALLERGY) 10 MG CAPS, Take 10 mg by mouth daily. , Disp: , Rfl:  .  doxazosin (CARDURA) 8 MG tablet, TAKE ONE TABLET BY MOUTH AT BEDTIME, Disp: 90 tablet, Rfl: 0 .  HYDROcodone-acetaminophen (NORCO/VICODIN) 5-325 MG tablet, Take one or two po daily as needed, Disp: 45 tablet, Rfl: 0 .  lamoTRIgine (LAMICTAL) 150 MG tablet, Take 1 tablet (150 mg total) by mouth 2 (two) times daily., Disp: 60 tablet, Rfl: 11 .  lisinopril  (PRINIVIL,ZESTRIL) 20 MG tablet, TAKE ONE TABLET BY MOUTH TWICE DAILY, Disp: 180 tablet, Rfl: 2 .  meloxicam (MOBIC) 7.5 MG tablet, Take 1 tablet (7.5 mg total) by mouth daily., Disp: 30 tablet, Rfl: 5 .  sildenafil (VIAGRA) 100 MG tablet, Take one half to one pill as needed, Disp: 10 tablet, Rfl: 11 .  spironolactone (ALDACTONE) 25 MG tablet, TAKE ONE TABLET BY MOUTH ONCE DAILY, Disp: 90 tablet, Rfl: 1  PAST MEDICAL HISTORY: Past Medical History:  Diagnosis Date  . Erectile dysfunction   . Hyperlipidemia   . Hypertension     PAST SURGICAL HISTORY: Past Surgical History:  Procedure Laterality Date  . KNEE ARTHROSCOPY     1989 left    FAMILY HISTORY: Family History  Problem Relation Age of Onset  . Hypertension Mother   . Diabetes Mother     SOCIAL HISTORY:  Social History   Social History  . Marital status: Married    Spouse name: N/A  . Number of children: N/A  . Years of education: N/A   Occupational History  . Not on file.   Social History Main Topics  . Smoking status: Never Smoker  . Smokeless tobacco: Never Used  . Alcohol use 0.6 oz/week    1 Cans of beer per week  . Drug use: No  . Sexual activity: Yes   Other Topics Concern  . Not on file   Social History Narrative  . No narrative on file     PHYSICAL EXAM  Vitals:   11/07/16 1541  BP: 128/76  Pulse: 70  Weight: 227 lb 8 oz (103.2 kg)  Height: 5' 11"  (1.803 m)    Body mass index is 31.73 kg/m.   General: The patient is well-developed and well-nourished and in no acute distress  Back:   The back is nontender. Range of motion is slightly reduced.  Neurologic Exam  Mental status: The patient is alert and oriented x 3 at the time of the examination. The patient has apparent normal recent and remote memory, with an apparently normal attention span and concentration ability.   Speech is normal.  Cranial nerves: Extraocular movements are full.   .Facial strength and sensation are normal.   Trapezius and sternocleidomastoid strength is normal. No dysarthria is noted.  No obvious hearing deficits are noted.  Motor:  Muscle bulk is normal.   Tone is normal. Strength is  5 / 5 in all 4 extremities.   No foot weakness.  Sensory:He has intact sensation to touch and temperature and vibration in the arms. He has reduced sensation to vibration in the feet. Sensation is normal at the ankles.   Coordination: Cerebellar testing reveals good finger-nose-finger and heel-to-shin bilaterally.  Gait and station: Station is normal.  The gait is normal. Tandem gait is normal. Romberg is  negative.   Reflexes: Deep tendon reflexes are symmetric and normal  bilaterally.   Plantar responses are flexor.    DIAGNOSTIC DATA (LABS, IMAGING, TESTING) - I reviewed patient records, labs, notes, testing and imaging myself where available.  No results found for: WBC, HGB, HCT, MCV, PLT    Component Value Date/Time   NA 140 04/05/2016 1408   K 3.9 04/05/2016 1408   CL 104 04/05/2016 1408   CO2 30 04/05/2016 1408   GLUCOSE 84 04/05/2016 1408   BUN 8 04/05/2016 1408   CREATININE 1.16 04/05/2016 1408   CALCIUM 8.7 04/05/2016 1408   PROT 7.3 11/16/2014 1635   ALBUMIN 4.1 09/01/2014 1551   AST 17 09/01/2014 1551   ALT 20 09/01/2014 1551   ALKPHOS 73 09/01/2014 1551   BILITOT 0.4 09/01/2014 1551   Lab Results  Component Value Date   CHOL 126 09/01/2014   HDL 34.00 (L) 09/01/2014   LDLCALC 71 09/01/2014   TRIG 103.0 09/01/2014   CHOLHDL 4 09/01/2014       ASSESSMENT AND PLAN  Polyneuropathy  Lumbosacral radiculopathy at S1  Dysesthesia  Midline low back pain without sciatica, unspecified chronicity  Erectile dysfunction, unspecified erectile dysfunction type    1. He will continue lamotrigine 150 mg by mouth twice a day. Additionally he will continue when necessary meloxicam when his back acts up.n 2. Hydrocodone one or 2 a day.  3.   viagra prn for ED 4.   rtc 6 mos or sooner  if  new or worsening symptoms   Kamisha Ell A. Felecia Shelling, MD, PhD 04/14/1094, 0:45 PM Certified in Neurology, Clinical Neurophysiology, Sleep Medicine, Pain Medicine and Neuroimaging  [facility] [address] [place], [place] [address] [phone] =

## 2016-11-15 ENCOUNTER — Other Ambulatory Visit: Payer: Self-pay

## 2017-01-15 ENCOUNTER — Telehealth: Payer: Self-pay | Admitting: Neurology

## 2017-01-15 MED ORDER — HYDROCODONE-ACETAMINOPHEN 5-325 MG PO TABS: ORAL_TABLET | ORAL | 0 refills | Status: DC

## 2017-01-15 NOTE — Telephone Encounter (Signed)
Rx. up front [facility]/fim 

## 2017-01-15 NOTE — Telephone Encounter (Signed)
Pt request refill for HYDROcodone-acetaminophen (NORCO/VICODIN) 5-325 MG tablet °

## 2017-01-15 NOTE — Telephone Encounter (Signed)
Rx. awaiting RAS sig/fim 

## 2017-02-12 ENCOUNTER — Other Ambulatory Visit: Payer: Self-pay | Admitting: Cardiovascular Disease

## 2017-02-14 ENCOUNTER — Telehealth: Payer: Self-pay | Admitting: Neurology

## 2017-02-14 MED ORDER — HYDROCODONE-ACETAMINOPHEN 5-325 MG PO TABS: ORAL_TABLET | ORAL | 0 refills | Status: DC

## 2017-02-14 NOTE — Telephone Encounter (Signed)
Rx. awaiting RAS sig/fim 

## 2017-02-14 NOTE — Telephone Encounter (Signed)
Rx. up front [facility]/fim 

## 2017-02-14 NOTE — Telephone Encounter (Signed)
Pt request refill for HYDROcodone-acetaminophen (NORCO/VICODIN) 5-325 MG tablet . Pt is aware clinic closes at noon tomorrow

## 2017-03-17 ENCOUNTER — Other Ambulatory Visit: Payer: Self-pay | Admitting: Cardiovascular Disease

## 2017-03-19 ENCOUNTER — Telehealth: Payer: Self-pay | Admitting: Physician Assistant

## 2017-03-19 MED ORDER — SPIRONOLACTONE 25 MG PO TABS: 25.0000 mg | ORAL_TABLET | Freq: Every day | ORAL | 0 refills | Status: DC

## 2017-03-19 NOTE — Telephone Encounter (Signed)
Paged for Rx of spironolactone. Sent.

## 2017-03-20 ENCOUNTER — Telehealth: Payer: Self-pay | Admitting: Neurology

## 2017-03-20 MED ORDER — HYDROCODONE-ACETAMINOPHEN 5-325 MG PO TABS: ORAL_TABLET | ORAL | 0 refills | Status: DC

## 2017-03-20 NOTE — Addendum Note (Signed)
Addended by: Candis SchatzMISENHEIMER, Teirra Carapia I on: 03/20/2017 11:36 AM   Modules accepted: Orders

## 2017-03-20 NOTE — Telephone Encounter (Signed)
Rx. up front [facility]/fim 

## 2017-03-20 NOTE — Telephone Encounter (Signed)
Rx. awaiting RAS sig/fim 

## 2017-03-20 NOTE — Telephone Encounter (Signed)
Patient requesting refill of HYDROcodone-acetaminophen (NORCO/VICODIN) 5-325 MG tablet. ° ° °

## 2017-04-01 ENCOUNTER — Other Ambulatory Visit: Payer: Self-pay | Admitting: Cardiovascular Disease

## 2017-04-03 ENCOUNTER — Other Ambulatory Visit: Payer: Self-pay | Admitting: Cardiovascular Disease

## 2017-04-11 ENCOUNTER — Telehealth: Payer: Self-pay | Admitting: Cardiovascular Disease

## 2017-04-11 NOTE — Telephone Encounter (Signed)
°*  STAT* If patient is at the pharmacy, call can be transferred to refill team.   1. Which medications need to be refilled? (please list name of each medication and dose if known) carvedilol   2. Which pharmacy/location (including street and city if local pharmacy) is medication to be sent to?[id number] - [place] ([place]), [place] - [address]  3. Do they need a 30 day or 90 day supply? 90

## 2017-04-11 NOTE — Telephone Encounter (Signed)
Called pt and pt's pharmacy and pt's pharmacy stated that pt's medication was ready to be picked up. I informed the pt that his medication was ready to be picked up at his pharmacy and if he has any other problems, questions or concerns to call the office. Pt verbalized understanding.

## 2017-04-22 ENCOUNTER — Telehealth: Payer: Self-pay | Admitting: Neurology

## 2017-04-22 MED ORDER — HYDROCODONE-ACETAMINOPHEN 5-325 MG PO TABS: ORAL_TABLET | ORAL | 0 refills | Status: DC

## 2017-04-22 NOTE — Telephone Encounter (Signed)
Rx. awaiting RAS sig/fim 

## 2017-04-22 NOTE — Telephone Encounter (Signed)
Patient requesting refill of HYDROcodone-acetaminophen (NORCO/VICODIN) 5-325 MG tablet. ° ° °

## 2017-04-22 NOTE — Telephone Encounter (Signed)
Hydrocodone rx. up front [facility]/fim 

## 2017-04-22 NOTE — Addendum Note (Signed)
Addended by: Candis SchatzMISENHEIMER, Alexandre Lightsey I on: 04/22/2017 01:52 PM   Modules accepted: Orders

## 2017-04-23 ENCOUNTER — Ambulatory Visit: Payer: PRIVATE HEALTH INSURANCE | Admitting: Cardiovascular Disease

## 2017-04-23 ENCOUNTER — Encounter: Payer: Self-pay | Admitting: Cardiovascular Disease

## 2017-04-23 VITALS — BP 134/86 | HR 57 | Ht 70.0 in | Wt 231.0 lb

## 2017-04-23 DIAGNOSIS — E782 Mixed hyperlipidemia: Secondary | ICD-10-CM | POA: Diagnosis not present

## 2017-04-23 DIAGNOSIS — I10 Essential (primary) hypertension: Secondary | ICD-10-CM | POA: Diagnosis not present
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
## 2017-04-23 NOTE — Patient Instructions (Signed)

## 2017-05-10 ENCOUNTER — Other Ambulatory Visit: Payer: Self-pay | Admitting: Neurology

## 2017-05-14 ENCOUNTER — Ambulatory Visit: Payer: PRIVATE HEALTH INSURANCE | Admitting: Neurology

## 2017-05-14 ENCOUNTER — Encounter: Payer: Self-pay | Admitting: Neurology

## 2017-05-14 VITALS — BP 119/75 | HR 67 | Ht 70.0 in | Wt 225.0 lb

## 2017-05-14 DIAGNOSIS — R208 Other disturbances of skin sensation: Secondary | ICD-10-CM

## 2017-05-14 DIAGNOSIS — G629 Polyneuropathy, unspecified: Secondary | ICD-10-CM

## 2017-05-14 DIAGNOSIS — N529 Male erectile dysfunction, unspecified: Secondary | ICD-10-CM | POA: Diagnosis not present

## 2017-05-14 MED ORDER — HYDROCODONE-ACETAMINOPHEN 5-325 MG PO TABS: ORAL_TABLET | ORAL | 0 refills | Status: DC

## 2017-05-14 MED ORDER — LAMOTRIGINE 150 MG PO TABS: 150.0000 mg | ORAL_TABLET | Freq: Two times a day (BID) | ORAL | 11 refills | Status: DC

## 2017-05-14 NOTE — Progress Notes (Signed)
[facility]  PATIENT: Jeffrey Good DOB: March 01, 1951  REFERRING DOCTOR OR PCP:  Donald Prose SOURCE: patient, records from Dr. Nancy Fetter  _________________________________   HISTORICAL  CHIEF COMPLAINT:  Chief Complaint  Patient presents with  . Follow-up    HISTORY OF PRESENT ILLNESS:  Jeffrey Good is a 67 yo man with burning dysesthesias and back pain.      Update 05/14/2017:    He feels his dysesthetic nerve pain is doing well on lamotrigine 150 mg po bid.    He tolerates it well.     He notes no worsening numbness.    He denies any change in strength.    Balance and gait are good.   He walks 3 miles and/or rides exercise bike x 45 minutes daily.      He takes hydrocodone many nights with benefit as his foot pain is worse when he lays down  His LBP is about the same .   It bothers him more at night or when laying down.   He does better with movements.  Sitting is not as bad.  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     He also has had ED since the neuropathy started.    Viagra 50-100 mg usually helps a lot.       From 11/07/2016: Dysesthetic Pain:   He has a burning pain in both feet. He feels that this is doing better on lamotrigine 150 mg twice a day that on a lower dose. There are still some days that hurt more and hydrocodone will help. Pain is worse on the bottom of the foot than the top of the foot. He does not note any weakness. He does not feel that this has worsened.   He feels he walks well. He does not have any weakness. Balance is fine.   He has not had any bladder or bowel changes. Etiology of the neuropathy is unknown. Lab work have been normal and he does not have diabetes or other systemic illnesses .       LBP:    LBP is doing better.  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  MRI shows degenerative changes at L5-S1. Nerve conduction study /  EMG showed a mild S1 chronic radiculopathy.  ED:   He reports erectile dysfunction that is helped by Viagra.    History of dysesthesia:     Early 2017, he had the onset of a burning sensation in his feet.  He did not note any change in the color of most of the toes though the tip of the big toe did seem to be a mildly different color. Sometimes, he gets a sensation of swelling in the big toes. Initially, he was tried on Lyrica but had stomach upset and stopped. Then, he was tried on gabapentin. 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 He does not think he was on either of these medications long enough to know where the not he got a benefit.  Lamotrigine combined with hydrocodone has helped best..        Studies :    ESR, cryoglobulins, SPEP/IEF and ANA were normal.    An NCV/EMG was more consistent with mild S1 chronic radiculopathy. There was not evidence of a large fiber polyneuropathy, though a small fiber  polyneuropathy is still possible.   REVIEW OF SYSTEMS: Constitutional: No fevers, chills, sweats, or change in appetite Eyes: No visual changes, double vision, eye pain Ear, nose and throat: No hearing loss, ear pain, nasal congestion, sore throat Cardiovascular: No chest pain, palpitations Respiratory: No shortness of breath at rest or with exertion.   No wheezes GastrointestinaI: No nausea, vomiting, diarrhea, abdominal pain, fecal incontinence Genitourinary: No dysuria, urinary retention or frequency.  No nocturia.  He has mild ED. Musculoskeletal: No neck pain, back pain Integumentary: No rash, pruritus, skin lesions Neurological: as above Psychiatric: No depression at this time.  No anxiety Endocrine: No palpitations, diaphoresis, change in appetite, change in weigh or increased thirst Hematologic/Lymphatic: No anemia, purpura, petechiae.   ALLERGIES: Allergies  Allergen Reactions  . Pregabalin Nausea Only    HOME  MEDICATIONS:  Current Outpatient Medications:  .  carvedilol (COREG) 25 MG tablet, TAKE 1 TABLET BY MOUTH TWICE DAILY(PLEASE KEEP UPCOMING APPT FOR FUTURE REFILLS), Disp: 90 tablet, Rfl: 0 .  Cetirizine HCl (ZYRTEC ALLERGY) 10 MG CAPS, Take 10 mg by mouth daily. , Disp: , Rfl:  .  doxazosin (CARDURA) 8 MG tablet, Take 1 tablet (8 mg total) at bedtime by mouth. Please keep upcoming appt for future refills. Thanks, Disp: 90 tablet, Rfl: 0 .  HYDROcodone-acetaminophen (NORCO/VICODIN) 5-325 MG tablet, Take one or two po daily as needed, Disp: 45 tablet, Rfl: 0 .  lamoTRIgine (LAMICTAL) 150 MG tablet, Take 1 tablet (150 mg total) by mouth 2 (two) times daily., Disp: 60 tablet, Rfl: 11 .  lisinopril (PRINIVIL,ZESTRIL) 20 MG tablet, TAKE 1 TABLET BY MOUTH TWICE DAILY, Disp: 180 tablet, Rfl: 0 .  meloxicam (MOBIC) 7.5 MG tablet, TAKE 1 TABLET BY MOUTH EVERY DAY, Disp: 30 tablet, Rfl: 5 .  potassium chloride (K-DUR) 10 MEQ tablet, Take 10 mEq by mouth daily., Disp: , Rfl:  .  sildenafil (VIAGRA) 100 MG tablet, Take one half to one pill as needed, Disp: 10 tablet, Rfl: 11 .  spironolactone (ALDACTONE) 25 MG tablet, Take 1 tablet (25 mg total) by mouth daily., Disp: 90 tablet, Rfl: 0  PAST MEDICAL HISTORY: Past Medical History:  Diagnosis Date  . Erectile dysfunction   . Hyperlipidemia   . Hypertension     PAST SURGICAL HISTORY: Past Surgical History:  Procedure Laterality Date  . KNEE ARTHROSCOPY     1989 left    FAMILY HISTORY: Family History  Problem Relation Age of Onset  . Hypertension Mother   . Diabetes Mother     SOCIAL HISTORY:  Social History   Socioeconomic History  . Marital status: Married    Spouse name: Not on file  . Number of children: Not on file  . Years of education: Not on file  . Highest education level: Not on file  Social Needs  . Financial resource strain: Not on file  . Food insecurity - worry: Not on file  . Food insecurity - inability: Not on file  .  Transportation needs - medical: Not on file  . Transportation needs - non-medical: Not on file  Occupational History  . Not on file  Tobacco Use  . Smoking status: Never Smoker  . Smokeless tobacco: Never Used  Substance and Sexual Activity  . Alcohol use: Yes    Alcohol/week: 0.6 oz    Types: 1 Cans of beer per week  . Drug use: No  . Sexual activity: Yes  Other Topics Concern  . Not on file  Social History  Narrative  . Not on file     PHYSICAL EXAM  Vitals:   05/14/17 1609  BP: 119/75  Pulse: 67  Weight: 225 lb (102.1 kg)  Height: 5' 10"  (1.778 m)    Body mass index is 32.28 kg/m.   General: The patient is well-developed and well-nourished and in no acute distress  Back:   The back is nontender. Range of motion is slightly reduced.  Neurologic Exam  Mental status: The patient is alert and oriented x 3 at the time of the examination. The patient has apparent normal recent and remote memory, with an apparently normal attention span and concentration ability.   Speech is normal.  Cranial nerves: Extraocular movements are full.   .Facial strength and sensation are normal.  Trapezius and sternocleidomastoid strength is normal. No dysarthria is noted.  No obvious hearing deficits are noted.  Motor:  Muscle bulk is normal.   Tone is normal. Strength is  5 / 5 in all 4 extremities.   No foot weakness.  Sensory:  He has intact sensation to touch and temperature and vibration in the arms. He has reduced sensation to vibration in the feet.   Sensation is normal at the ankles.    Touch sensation is normal in the feet.  Coordination: Cerebellar testing reveals good finger-nose-finger and heel-to-shin bilaterally.  Gait and station: Station is normal.  The gait is normal. Tandem gait is normal. Romberg is  negative.   Reflexes: Deep tendon reflexes are symmetric and normal bilaterally.     DIAGNOSTIC DATA (LABS, IMAGING, TESTING) - I reviewed patient records, labs, notes,  testing and imaging myself where available.  No results found for: WBC, HGB, HCT, MCV, PLT    Component Value Date/Time   NA 140 04/05/2016 1408   K 3.9 04/05/2016 1408   CL 104 04/05/2016 1408   CO2 30 04/05/2016 1408   GLUCOSE 84 04/05/2016 1408   BUN 8 04/05/2016 1408   CREATININE 1.16 04/05/2016 1408   CALCIUM 8.7 04/05/2016 1408   PROT 7.3 11/16/2014 1635   ALBUMIN 4.1 09/01/2014 1551   AST 17 09/01/2014 1551   ALT 20 09/01/2014 1551   ALKPHOS 73 09/01/2014 1551   BILITOT 0.4 09/01/2014 1551   Lab Results  Component Value Date   CHOL 126 09/01/2014   HDL 34.00 (L) 09/01/2014   LDLCALC 71 09/01/2014   TRIG 103.0 09/01/2014   CHOLHDL 4 09/01/2014       ASSESSMENT AND PLAN  Dysesthesia  Polyneuropathy  Erectile dysfunction, unspecified erectile dysfunction type    1. He will continue lamotrigine 150 mg twice a day. Hydrocodone one or 2 at bedtime. 2.   viagra prn for ED 3.   rtc 6 mos or sooner if  new or worsening symptoms   Richard A. Felecia Shelling, MD, PhD 07/10/3293, 1:88 PM Certified in Neurology, Clinical Neurophysiology, Sleep Medicine, Pain Medicine and Neuroimaging  [facility] [address], [place], [place] [address] [phone] =

## 2017-05-20 ENCOUNTER — Other Ambulatory Visit: Payer: Self-pay | Admitting: Cardiovascular Disease

## 2017-06-03 ENCOUNTER — Other Ambulatory Visit: Payer: Self-pay | Admitting: *Deleted

## 2017-06-03 ENCOUNTER — Other Ambulatory Visit: Payer: Self-pay | Admitting: Cardiovascular Disease

## 2017-06-03 MED ORDER — LISINOPRIL 20 MG PO TABS: 20.0000 mg | ORAL_TABLET | Freq: Two times a day (BID) | ORAL | 3 refills | Status: DC

## 2017-07-17 ENCOUNTER — Telehealth: Payer: Self-pay | Admitting: Neurology

## 2017-07-17 MED ORDER — HYDROCODONE-ACETAMINOPHEN 5-325 MG PO TABS: ORAL_TABLET | ORAL | 0 refills | Status: DC

## 2017-07-17 NOTE — Telephone Encounter (Signed)
Pt has called for refill prescription for HYDROcodone-acetaminophen (NORCO/VICODIN) 5-325 MG tablet

## 2017-07-17 NOTE — Telephone Encounter (Signed)
Rx. awaiting RAS sig/fim 

## 2017-07-17 NOTE — Telephone Encounter (Signed)
Rx. up front [facility]/fim 

## 2017-07-17 NOTE — Addendum Note (Signed)
Addended by: Candis SchatzMISENHEIMER, Stella Bortle I on: 07/17/2017 01:21 PM   Modules accepted: Orders

## 2017-08-20 ENCOUNTER — Telehealth: Payer: Self-pay | Admitting: Neurology

## 2017-08-20 MED ORDER — HYDROCODONE-ACETAMINOPHEN 5-325 MG PO TABS: ORAL_TABLET | ORAL | 0 refills | Status: DC

## 2017-08-20 NOTE — Telephone Encounter (Signed)
Rx. up front [facility]/fim 

## 2017-08-20 NOTE — Telephone Encounter (Signed)
Pt requesting a refill for HYDROcodone-acetaminophen (NORCO/VICODIN) 5-325 MG tablet °

## 2017-08-20 NOTE — Telephone Encounter (Signed)
Rx. awaiting RAS sig/fim 

## 2017-09-19 ENCOUNTER — Telehealth: Payer: Self-pay | Admitting: Neurology

## 2017-09-19 MED ORDER — HYDROCODONE-ACETAMINOPHEN 5-325 MG PO TABS: ORAL_TABLET | ORAL | 0 refills | Status: DC

## 2017-09-19 NOTE — Telephone Encounter (Signed)
Pt requesting a refill for HYDROcodone-acetaminophen (NORCO/VICODIN) 5-325 MG tablet, pt advised the office will close at noon tomorrow

## 2017-09-19 NOTE — Telephone Encounter (Signed)
Rx. up front [facility]/fim 

## 2017-09-19 NOTE — Telephone Encounter (Signed)
Rx. awaiting RAS sig/fim 

## 2017-10-21 ENCOUNTER — Telehealth: Payer: Self-pay | Admitting: Neurology

## 2017-10-21 DIAGNOSIS — Z79899 Other long term (current) drug therapy: Secondary | ICD-10-CM

## 2017-10-21 DIAGNOSIS — G894 Chronic pain syndrome: Secondary | ICD-10-CM

## 2017-10-21 DIAGNOSIS — M5417 Radiculopathy, lumbosacral region: Secondary | ICD-10-CM

## 2017-10-21 NOTE — Telephone Encounter (Signed)
Pt has called for a refill on his HYDROcodone-acetaminophen (NORCO/VICODIN) 5-325 MG tablet

## 2017-10-22 ENCOUNTER — Other Ambulatory Visit: Payer: PRIVATE HEALTH INSURANCE

## 2017-10-22 MED ORDER — HYDROCODONE-ACETAMINOPHEN 5-325 MG PO TABS: ORAL_TABLET | ORAL | 0 refills | Status: DC

## 2017-10-22 NOTE — Telephone Encounter (Signed)
Rx. awaiting RAS sig/fim 

## 2017-10-22 NOTE — Telephone Encounter (Signed)
Rx. up front [facility]/fim 

## 2017-10-22 NOTE — Addendum Note (Signed)
Addended by: Candis SchatzMISENHEIMER, Pilot Prindle I on: 10/22/2017 07:38 AM   Modules accepted: Orders

## 2017-11-07 ENCOUNTER — Other Ambulatory Visit: Payer: Self-pay | Admitting: Neurology

## 2017-11-07 NOTE — Telephone Encounter (Signed)
Pt has appt 11-13-17, did you want to refill now?

## 2017-11-13 ENCOUNTER — Encounter: Payer: Self-pay | Admitting: Neurology

## 2017-11-13 ENCOUNTER — Other Ambulatory Visit: Payer: Self-pay

## 2017-11-13 ENCOUNTER — Ambulatory Visit: Payer: PRIVATE HEALTH INSURANCE | Admitting: Neurology

## 2017-11-13 VITALS — BP 128/79 | HR 74 | Resp 18 | Ht 70.0 in | Wt 220.0 lb

## 2017-11-13 DIAGNOSIS — N529 Male erectile dysfunction, unspecified: Secondary | ICD-10-CM

## 2017-11-13 DIAGNOSIS — R208 Other disturbances of skin sensation: Secondary | ICD-10-CM

## 2017-11-13 DIAGNOSIS — M545 Low back pain, unspecified: Secondary | ICD-10-CM

## 2017-11-13 DIAGNOSIS — G629 Polyneuropathy, unspecified: Secondary | ICD-10-CM | POA: Diagnosis not present

## 2017-11-13 MED ORDER — HYDROCODONE-ACETAMINOPHEN 5-325 MG PO TABS: ORAL_TABLET | ORAL | 0 refills | Status: DC

## 2017-11-13 MED ORDER — SILDENAFIL CITRATE 100 MG PO TABS: ORAL_TABLET | ORAL | 11 refills | Status: DC

## 2017-11-13 NOTE — Progress Notes (Signed)
[facility]  PATIENT: Jeffrey Good DOB: 1950/04/25  REFERRING DOCTOR OR PCP:  Donald Prose SOURCE: patient, records from Dr. Nancy Fetter  _________________________________   HISTORICAL  CHIEF COMPLAINT:  Chief Complaint  Patient presents with  . Back Pain    Sts. chronic pain is pretty well controlled with current med regimen, except for in the early am after waking.  Pain gets better thruout the day/fim  . Polyneuropathy    HISTORY OF PRESENT ILLNESS:  Jeffrey Good is a 67 yo man with burning dysesthesias and back pain.      Update 11/13/2017: He has numbness in the feet and mild dysesthesia.  Lamotrigine 150 mg twice daily has helped a lot.  He tolerates it well.  He denies any significant weakness.  He feels the balance and gait are doing well.  He walks up to 3 miles a day and also uses an exercise bike in bad weather.  The foot pain is usually worse at night and he takes hydrocodone with benefit.  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  Viagra has been helpful.   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  He has some low back pain and feels it is doing about the same.  Moving around helps the back pain when laying down worsens it.  He notes it for at night.  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  Update 05/14/2017:    He feels his dysesthetic nerve pain is doing well on lamotrigine 150 mg po bid.    He tolerates it well.     He notes no worsening numbness.    He denies any change in strength.    Balance and gait are good.   He walks 3 miles and/or rides exercise bike x 45 minutes daily.      He takes hydrocodone many nights with benefit as his foot pain is worse when he lays down  His LBP is about the same .   It bothers him more at night or when laying down.   He does better with movements.  Sitting is not as bad.  He works as a Administrator and likes to take  breaks to move around.     He also has had ED since the neuropathy started.    Viagra 50-100 mg usually helps a lot.       From 11/07/2016: Dysesthetic Pain:   He has a burning pain in both feet. He feels that this is doing better on lamotrigine 150 mg twice a day that on a lower dose. There are still some days that hurt more and hydrocodone will help. Pain is worse on the bottom of the foot than the top of the foot. He does not note any weakness. He does not feel that this has worsened.   He feels he walks well. He does not have any weakness. Balance is fine.   He has not had any bladder or bowel changes. Etiology of the neuropathy is unknown. Lab work have been normal and he does not have diabetes or other systemic illnesses .       LBP:    LBP is doing better.  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  MRI shows degenerative changes at L5-S1. Nerve conduction study /  EMG showed a mild S1 chronic radiculopathy.  ED:   He reports erectile dysfunction that is helped by Viagra.    History of dysesthesia:     Early 2017, he had the onset of a burning sensation in his feet.  He did not note any change in the color of most of the toes though the tip of the big toe did seem to be a mildly different color. Sometimes, he gets a sensation of swelling in the big toes. Initially, he was tried on Lyrica but had stomach upset and stopped. Then, he was tried on gabapentin. 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 He does not think he was on either of these medications long enough to know where the not he got a benefit.  Lamotrigine combined with hydrocodone has helped best..        Studies :    ESR, cryoglobulins, SPEP/IEF and ANA were normal.    An NCV/EMG was more consistent with mild S1 chronic radiculopathy. There was not evidence of a large fiber polyneuropathy, though a small fiber  polyneuropathy is still possible.   REVIEW OF SYSTEMS: Constitutional: No fevers, chills, sweats, or change in appetite Eyes: No visual changes, double vision, eye pain Ear, nose and throat: No hearing loss, ear pain, nasal congestion, sore throat Cardiovascular: No chest pain, palpitations Respiratory: No shortness of breath at rest or with exertion.   No wheezes GastrointestinaI: No nausea, vomiting, diarrhea, abdominal pain, fecal incontinence Genitourinary: No dysuria, urinary retention or frequency.  No nocturia.  He has mild ED. Musculoskeletal: No neck pain, back pain Integumentary: No rash, pruritus, skin lesions Neurological: as above Psychiatric: No depression at this time.  No anxiety Endocrine: No palpitations, diaphoresis, change in appetite, change in weigh or increased thirst Hematologic/Lymphatic: No anemia, purpura, petechiae.   ALLERGIES: Allergies  Allergen Reactions  . Pregabalin Nausea Only    HOME MEDICATIONS:  Current Outpatient Medications:  .  carvedilol (COREG) 25 MG tablet, TAKE 1 TABLET BY MOUTH TWICE DAILY, Disp: 180 tablet, Rfl: 3 .  Cetirizine HCl (ZYRTEC ALLERGY) 10 MG CAPS, Take 10 mg by mouth daily. , Disp: , Rfl:  .  doxazosin (CARDURA) 8 MG tablet, Take 1 tablet (8 mg total) by mouth at bedtime., Disp: 90 tablet, Rfl: 3 .  HYDROcodone-acetaminophen (NORCO/VICODIN) 5-325 MG tablet, Take one or two po daily as needed, Disp: 45 tablet, Rfl: 0 .  lamoTRIgine (LAMICTAL) 150 MG tablet, Take 1 tablet (150 mg total) by mouth 2 (two) times daily., Disp: 60 tablet, Rfl: 11 .  lisinopril (PRINIVIL,ZESTRIL) 20 MG tablet, Take 1 tablet (20 mg total) by mouth 2 (two) times daily., Disp: 180 tablet, Rfl: 3 .  meloxicam (MOBIC) 7.5 MG tablet, TAKE 1 TABLET BY MOUTH EVERY DAY, Disp: 30 tablet, Rfl: 5 .  sildenafil (VIAGRA) 100 MG tablet, Take one half to one pill as needed, Disp: 10 tablet, Rfl: 11 .  spironolactone (ALDACTONE) 25 MG tablet, Take 1 tablet (25  mg total) by mouth daily., Disp: 90 tablet, Rfl: 0  PAST MEDICAL HISTORY: Past Medical History:  Diagnosis Date  . Erectile dysfunction   . Hyperlipidemia   . Hypertension     PAST SURGICAL HISTORY: Past Surgical History:  Procedure Laterality Date  . KNEE ARTHROSCOPY     1989 left    FAMILY HISTORY: Family History  Problem Relation Age of Onset  . Hypertension Mother   . Diabetes Mother       SOCIAL HISTORY:  Social History   Socioeconomic History  . Marital status: Married    Spouse name: Not on file  . Number of children: Not on file  . Years of education: Not on file  . Highest education level: Not on file  Occupational History  . Not on file  Social Needs  . Financial resource strain: Not on file  . Food insecurity:    Worry: Not on file    Inability: Not on file  . Transportation needs:    Medical: Not on file    Non-medical: Not on file  Tobacco Use  . Smoking status: Never Smoker  . Smokeless tobacco: Never Used  Substance and Sexual Activity  . Alcohol use: Yes    Alcohol/week: 0.6 oz    Types: 1 Cans of beer per week  . Drug use: No  . Sexual activity: Yes  Lifestyle  . Physical activity:    Days per week: Not on file    Minutes per session: Not on file  . Stress: Not on file  Relationships  . Social connections:    Talks on phone: Not on file    Gets together: Not on file    Attends religious service: Not on file    Active member of club or organization: Not on file    Attends meetings of clubs or organizations: Not on file    Relationship status: Not on file  . Intimate partner violence:    Fear of current or ex partner: Not on file    Emotionally abused: Not on file    Physically abused: Not on file    Forced sexual activity: Not on file  Other Topics Concern  . Not on file  Social History Narrative  . Not on file     PHYSICAL EXAM  Vitals:   11/13/17 1600  BP: 128/79  Pulse: 74  Resp: 18  Weight: 220 lb (99.8 kg)    Height: 5' 10" (1.778 m)    Body mass index is 31.57 kg/m.   General: The patient is well-developed and well-nourished and in no acute distress  Back:   The back is nontender. Range of motion is slightly reduced.  Neurologic Exam  Mental status: The patient is alert and oriented x 3 at the time of the examination. The patient has apparent normal recent and remote memory, with an apparently normal attention span and concentration ability.   Speech is normal.  Cranial nerves: Extraocular movements are full.  Facial strength and sensation were normal.  No obvious hearing deficits are noted.  Motor:  Muscle bulk is normal.   Muscle tone is normal.  Strength was 5/5 in the arms and legs including the feet.  Sensory:  He has intact sensation to touch and temperature and vibration in the arms.  He had reduced vibration sensation in the toes but normal sensation at the ankles.  He had reduced touch sensation at the toes but normal sensation above the ankles.  Coordination: Cerebellar testing reveals good finger-nose-finger and heel-to-shin bilaterally.  Gait and station: Station is normal.  The gait is normal. Tandem gait is normal. Romberg is  negative.   Reflexes: Deep tendon reflexes are symmetric and normal bilaterally.     DIAGNOSTIC DATA (LABS, IMAGING, TESTING) - I reviewed patient records, labs, notes, testing and imaging myself where available.  No results found for: WBC, HGB, HCT, MCV, PLT    Component Value Date/Time   NA 140 04/05/2016 1408  K 3.9 04/05/2016 1408   CL 104 04/05/2016 1408   CO2 30 04/05/2016 1408   GLUCOSE 84 04/05/2016 1408   BUN 8 04/05/2016 1408   CREATININE 1.16 04/05/2016 1408   CALCIUM 8.7 04/05/2016 1408   PROT 7.3 11/16/2014 1635   ALBUMIN 4.1 09/01/2014 1551   AST 17 09/01/2014 1551   ALT 20 09/01/2014 1551   ALKPHOS 73 09/01/2014 1551   BILITOT 0.4 09/01/2014 1551   Lab Results  Component Value Date   CHOL 126 09/01/2014   HDL 34.00  (L) 09/01/2014   LDLCALC 71 09/01/2014   TRIG 103.0 09/01/2014   CHOLHDL 4 09/01/2014       ASSESSMENT AND PLAN  Polyneuropathy  Dysesthesia  Midline low back pain without sciatica, unspecified chronicity  Erectile dysfunction, unspecified erectile dysfunction type    1.   He will continue lamotrigine 150 mg p.o. twice daily for the neuropathic pain.  Hydrocodone 1-2 at bedtime for neuropathy and back pain. 2.   Viagra as needed for ED 3.   rtc 6 mos or sooner if  new or worsening symptoms   Courtenay Hirth A. Felecia Shelling, MD, PhD 05/12/3005, 6:22 PM Certified in Neurology, Clinical Neurophysiology, Sleep Medicine, Pain Medicine and Neuroimaging  [facility] [address] [place], [place] [address] [phone] =

## 2017-12-03 ENCOUNTER — Other Ambulatory Visit: Payer: Self-pay | Admitting: Neurology

## 2017-12-16 ENCOUNTER — Telehealth: Payer: Self-pay | Admitting: Neurology

## 2017-12-16 NOTE — Telephone Encounter (Signed)
Called pt. Relayed below info. He will contact pharmacy. Nothing further needed.

## 2017-12-16 NOTE — Telephone Encounter (Signed)
Refill request too soon. Dr. Epimenio Foot sent in refill on 11/13/17 qty 10 tabs/30 days with 11 refills.

## 2017-12-16 NOTE — Telephone Encounter (Signed)
Pt has called for a refill on his sildenafil (VIAGRA) 100 MG tablet he is asking that it be called into  [id number] [place], [place] - [address] [phone] (Phone) [phone] (Fax)

## 2017-12-23 ENCOUNTER — Other Ambulatory Visit: Payer: Self-pay | Admitting: Physician Assistant

## 2018-01-16 ENCOUNTER — Telehealth: Payer: Self-pay | Admitting: Neurology

## 2018-01-16 MED ORDER — HYDROCODONE-ACETAMINOPHEN 5-325 MG PO TABS: ORAL_TABLET | ORAL | 0 refills | Status: DC

## 2018-01-16 NOTE — Telephone Encounter (Signed)
Rx. awaiting RAS sig/fim 

## 2018-01-16 NOTE — Telephone Encounter (Signed)
Rx. up front [facility]/fim 

## 2018-01-16 NOTE — Telephone Encounter (Signed)
Pt requesting a refill for HYDROcodone-acetaminophen (NORCO/VICODIN) 5-325 MG tablet advised the office will close at noon tomorrow

## 2018-02-13 ENCOUNTER — Telehealth: Payer: Self-pay | Admitting: Neurology

## 2018-02-13 MED ORDER — HYDROCODONE-ACETAMINOPHEN 5-325 MG PO TABS: ORAL_TABLET | ORAL | 0 refills | Status: DC

## 2018-02-13 NOTE — Telephone Encounter (Signed)
Checked drug registry. He last refilled 01/16/18 #45. Not receiving from other Md's. Last seen 11/13/17 and next f/u 04/03/18. Rx printed, waiting on MD signature

## 2018-02-13 NOTE — Telephone Encounter (Signed)
Pt requesting refills for HYDROcodone-acetaminophen (NORCO/VICODIN) 5-325 MG tablet aware the office will close at noon tomorrow

## 2018-02-14 NOTE — Telephone Encounter (Signed)
Placed rx up front for pt pick up.

## 2018-03-17 ENCOUNTER — Telehealth: Payer: Self-pay | Admitting: Neurology

## 2018-03-17 MED ORDER — HYDROCODONE-ACETAMINOPHEN 5-325 MG PO TABS: ORAL_TABLET | ORAL | 0 refills | Status: DC

## 2018-03-17 NOTE — Telephone Encounter (Signed)
Rx. awaiting RAS sig/fim 

## 2018-03-17 NOTE — Telephone Encounter (Signed)
Patient requesting refill of HYDROcodone-acetaminophen (NORCO/VICODIN) 5-325 MG tablet. ° ° °

## 2018-03-17 NOTE — Addendum Note (Signed)
Addended by: Candis SchatzMISENHEIMER, Parris Signer I on: 03/17/2018 02:59 PM   Modules accepted: Orders

## 2018-03-17 NOTE — Telephone Encounter (Signed)
Rx. up front [facility]/fim 

## 2018-04-03 ENCOUNTER — Ambulatory Visit: Payer: PRIVATE HEALTH INSURANCE | Admitting: Neurology

## 2018-04-03 ENCOUNTER — Encounter

## 2018-04-04 ENCOUNTER — Encounter: Payer: Self-pay | Admitting: Neurology

## 2018-04-08 ENCOUNTER — Other Ambulatory Visit: Payer: Self-pay | Admitting: Cardiovascular Disease

## 2018-04-08 ENCOUNTER — Encounter: Payer: Self-pay | Admitting: Cardiovascular Disease

## 2018-04-15 ENCOUNTER — Telehealth: Payer: Self-pay | Admitting: Neurology

## 2018-04-15 MED ORDER — HYDROCODONE-ACETAMINOPHEN 5-325 MG PO TABS: ORAL_TABLET | ORAL | 0 refills | Status: DC

## 2018-04-15 NOTE — Telephone Encounter (Signed)
Checked drug registry. He last refilled 03/18/2018 #45. Not receiving from other Md's. Last seen 11/13/17 and next f/u 05/20/2018. Rx printed, waiting on MD signature

## 2018-04-15 NOTE — Telephone Encounter (Signed)
Pt requesting refills for HYDROcodone-acetaminophen (NORCO/VICODIN) 5-325 MG tablet °

## 2018-04-16 NOTE — Telephone Encounter (Signed)
Placed printed/signed rx up front for pick up. 

## 2018-05-05 ENCOUNTER — Ambulatory Visit: Payer: PRIVATE HEALTH INSURANCE | Admitting: Cardiovascular Disease

## 2018-05-05 DIAGNOSIS — R0989 Other specified symptoms and signs involving the circulatory and respiratory systems: Secondary | ICD-10-CM

## 2018-05-06 ENCOUNTER — Encounter: Payer: Self-pay | Admitting: Cardiovascular Disease

## 2018-05-20 ENCOUNTER — Other Ambulatory Visit: Payer: Self-pay

## 2018-05-20 ENCOUNTER — Encounter: Payer: Self-pay | Admitting: Neurology

## 2018-05-20 ENCOUNTER — Ambulatory Visit: Payer: PRIVATE HEALTH INSURANCE | Admitting: Neurology

## 2018-05-20 VITALS — BP 121/70 | HR 59 | Ht 70.0 in | Wt 238.0 lb

## 2018-05-20 DIAGNOSIS — R208 Other disturbances of skin sensation: Secondary | ICD-10-CM

## 2018-05-20 DIAGNOSIS — G629 Polyneuropathy, unspecified: Secondary | ICD-10-CM

## 2018-05-20 DIAGNOSIS — N529 Male erectile dysfunction, unspecified: Secondary | ICD-10-CM | POA: Diagnosis not present

## 2018-05-20 DIAGNOSIS — M5417 Radiculopathy, lumbosacral region: Secondary | ICD-10-CM

## 2018-05-20 MED ORDER — HYDROCODONE-ACETAMINOPHEN 5-325 MG PO TABS: ORAL_TABLET | ORAL | 0 refills | Status: DC

## 2018-05-20 MED ORDER — LAMOTRIGINE 150 MG PO TABS: 150.0000 mg | ORAL_TABLET | Freq: Two times a day (BID) | ORAL | 11 refills | Status: DC

## 2018-05-20 NOTE — Progress Notes (Signed)
[facility]  PATIENT: Jeffrey Good DOB: 29-Aug-1950  REFERRING DOCTOR OR PCP:  Donald Prose SOURCE: patient, records from Dr. Nancy Fetter  _________________________________   HISTORICAL  CHIEF COMPLAINT:  Chief Complaint  Patient presents with  . Follow-up    RM 12, alone. Last seen 11/13/17. Denies any new symptoms. Doing well.  . Peripheral Neuropathy    Taking lamotrigine 150 mg p.o. twice daily, hydrocodone    HISTORY OF PRESENT ILLNESS:  Jeffrey Good is a 68 yo man with burning dysesthesias and back pain.      Update 05/20/2018: He is having numbness and tingling dysesthesia in his feet.    Lamotrigine 150 mg helped incompletely.   More recently, gabapentin was started and she is on 300 mg po tid.    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  The leg pain is fairly symmetric.   Pain is worse at night and when he first wakes up and sometimes after a long day of driving.Marland Kitchen  He sometimes needs a hydrocodone when pain is bothering him more.      He denies weakness in his legs.    He does not note bladder changes but has ED, helped by Viagra.     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      Update 11/13/2017: He has numbness in the feet and mild dysesthesia.  Lamotrigine 150 mg twice daily has helped a lot.  He tolerates it well.  He denies any significant weakness.  He feels the balance and gait are doing well.  He walks up to 3 miles a day and also uses an exercise bike in bad weather.  The foot pain is usually worse at night and he takes hydrocodone with benefit.  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  Viagra has been helpful.   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  He has some low back pain and feels it is doing about the  same.  Moving around helps the back pain when laying down worsens it.  He notes it for at night.  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  Update 05/14/2017:    He feels his dysesthetic nerve pain is doing well on lamotrigine 150 mg po bid.    He tolerates it well.     He notes no worsening numbness.    He denies any change in strength.    Balance and gait are good.   He walks 3 miles and/or rides exercise bike x 45 minutes daily.      He takes hydrocodone many nights with benefit as his foot pain is worse when he lays down  His LBP is about the same .   It bothers him more at night or when laying down.   He does better with movements.  Sitting is not as bad.  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     He also has had ED since the neuropathy started.    Viagra 50-100 mg usually helps a lot.       From 11/07/2016: Dysesthetic Pain:   He has a burning pain in both  feet. He feels that this is doing better on lamotrigine 150 mg twice a day that on a lower dose. There are still some days that hurt more and hydrocodone will help. Pain is worse on the bottom of the foot than the top of the foot. He does not note any weakness. He does not feel that this has worsened.   He feels he walks well. He does not have any weakness. Balance is fine.   He has not had any bladder or bowel changes. Etiology of the neuropathy is unknown. Lab work have been normal and he does not have diabetes or other systemic illnesses .       LBP:    LBP is doing better.  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  MRI shows degenerative changes at L5-S1. Nerve conduction study / EMG showed a mild S1 chronic radiculopathy.  ED:   He reports erectile dysfunction that is helped by Viagra.    History of dysesthesia:     Early 2017, he had the onset of a burning sensation in his feet.  He did not  note any change in the color of most of the toes though the tip of the big toe did seem to be a mildly different color. Sometimes, he gets a sensation of swelling in the big toes. Initially, he was tried on Lyrica but had stomach upset and stopped. Then, he was tried on gabapentin. 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 He does not think he was on either of these medications long enough to know where the not he got a benefit.  Lamotrigine combined with hydrocodone has helped best..        Studies :    ESR, cryoglobulins, SPEP/IEF and ANA were normal.    An NCV/EMG was more consistent with mild S1 chronic radiculopathy. There was not evidence of a large fiber polyneuropathy, though a small fiber polyneuropathy is still possible.   REVIEW OF SYSTEMS: Constitutional: No fevers, chills, sweats, or change in appetite Eyes: No visual changes, double vision, eye pain Ear, nose and throat: No hearing loss, ear pain, nasal congestion, sore throat Cardiovascular: No chest pain, palpitations Respiratory: No shortness of breath at rest or with exertion.   No wheezes GastrointestinaI: No nausea, vomiting, diarrhea, abdominal pain, fecal incontinence Genitourinary: No dysuria, urinary retention or frequency.  No nocturia.  He has mild ED. Musculoskeletal: No neck pain, back pain Integumentary: No rash, pruritus, skin lesions Neurological: as above Psychiatric: No depression at this time.  No anxiety Endocrine: No palpitations, diaphoresis, change in appetite, change in weigh or increased thirst Hematologic/Lymphatic: No anemia, purpura, petechiae.   ALLERGIES: Allergies  Allergen Reactions  . Pregabalin Nausea Only    HOME MEDICATIONS:  Current Outpatient Medications:  .  carvedilol (COREG) 25 MG tablet, TAKE 1 TABLET BY MOUTH TWICE DAILY, Disp: 180 tablet, Rfl: 3 .  Cetirizine HCl (ZYRTEC ALLERGY) 10 MG CAPS, Take 10 mg by mouth daily. , Disp: ,  Rfl:  .  doxazosin (CARDURA) 8 MG tablet, Take 1 tablet (8 mg total) by mouth at bedtime., Disp: 90 tablet, Rfl: 3 .  HYDROcodone-acetaminophen (NORCO/VICODIN) 5-325 MG tablet, Take one or two po daily as needed, Disp: 45 tablet, Rfl: 0 .  lamoTRIgine (LAMICTAL) 150 MG tablet, Take 1 tablet (150 mg total) by mouth 2 (two) times daily., Disp:  60 tablet, Rfl: 11 .  lisinopril (PRINIVIL,ZESTRIL) 20 MG tablet, Take 1 tablet (20 mg total) by mouth 2 (two) times daily., Disp: 180 tablet, Rfl: 3 .  meloxicam (MOBIC) 7.5 MG tablet, TAKE 1 TABLET BY MOUTH EVERY DAY, Disp: 30 tablet, Rfl: 5 .  sildenafil (VIAGRA) 100 MG tablet, Take one half to one pill as needed, Disp: 10 tablet, Rfl: 11 .  spironolactone (ALDACTONE) 25 MG tablet, TAKE 1 TABLET BY MOUTH ONCE DAILY, Disp: 90 tablet, Rfl: 0  PAST MEDICAL HISTORY: Past Medical History:  Diagnosis Date  . Erectile dysfunction   . Hyperlipidemia   . Hypertension     PAST SURGICAL HISTORY: Past Surgical History:  Procedure Laterality Date  . KNEE ARTHROSCOPY     1989 left    FAMILY HISTORY: Family History  Problem Relation Age of Onset  . Hypertension Mother   . Diabetes Mother     SOCIAL HISTORY:  Social History   Socioeconomic History  . Marital status: Married    Spouse name: Not on file  . Number of children: Not on file  . Years of education: Not on file  . Highest education level: Not on file  Occupational History  . Not on file  Social Needs  . Financial resource strain: Not on file  . Food insecurity:    Worry: Not on file    Inability: Not on file  . Transportation needs:    Medical: Not on file    Non-medical: Not on file  Tobacco Use  . Smoking status: Never Smoker  . Smokeless tobacco: Never Used  Substance and Sexual Activity  . Alcohol use: Yes    Alcohol/week: 1.0 standard drinks    Types: 1 Cans of beer per week    Comment: very rare  . Drug use: No  . Sexual activity: Yes  Lifestyle  . Physical  activity:    Days per week: Not on file    Minutes per session: Not on file  . Stress: Not on file  Relationships  . Social connections:    Talks on phone: Not on file    Gets together: Not on file    Attends religious service: Not on file    Active member of club or organization: Not on file    Attends meetings of clubs or organizations: Not on file    Relationship status: Not on file  . Intimate partner violence:    Fear of current or ex partner: Not on file    Emotionally abused: Not on file    Physically abused: Not on file    Forced sexual activity: Not on file  Other Topics Concern  . Not on file  Social History Narrative  . Not on file     PHYSICAL EXAM  Vitals:   05/20/18 1537  BP: 121/70  Pulse: (!) 59  SpO2: 97%  Weight: 238 lb (108 kg)  Height: _0  (1.778 m)    Body mass index is 34.15 kg/m.   General: The patient is well-developed and well-nourished and in no acute distress  Back:   The back is nontender. Range of motion is slightly reduced.  Neurologic Exam  Mental status: The patient is alert and oriented x 3 at the time of the examination. The patient has apparent normal recent and remote memory, with an apparently normal attention span and concentration ability.   Speech is normal.  Cranial nerves: Extraocular movements are full.  Facial strength was normal.  Hearing  appeared to be normal and symmetric. Motor:  Muscle bulk is normal.   Muscle tone is normal.  Strength was 5/5 in the arms and legs including the feet.  Sensory:  He has intact sensation to touch and temperature and vibration in the arms.  He had reduced vibration sensation in the toes but normal sensation at the ankles.  He had reduced touch sensation at the toes but normal sensation above the ankles.  Coordination: Cerebellar testing reveals good finger-nose-finger and heel-to-shin bilaterally.  Gait and station: Station is normal.  Gait and tandem gait are normal.. Romberg is   negative.   Reflexes: Deep tendon reflexes are symmetric and normal bilaterally.      DIAGNOSTIC DATA (LABS, IMAGING, TESTING) - I reviewed patient records, labs, notes, testing and imaging myself where available.  No results found for: WBC, HGB, HCT, MCV, PLT    Component Value Date/Time   NA 140 04/05/2016 1408   K 3.9 04/05/2016 1408   CL 104 04/05/2016 1408   CO2 30 04/05/2016 1408   GLUCOSE 84 04/05/2016 1408   BUN 8 04/05/2016 1408   CREATININE 1.16 04/05/2016 1408   CALCIUM 8.7 04/05/2016 1408   PROT 7.3 11/16/2014 1635   ALBUMIN 4.1 09/01/2014 1551   AST 17 09/01/2014 1551   ALT 20 09/01/2014 1551   ALKPHOS 73 09/01/2014 1551   BILITOT 0.4 09/01/2014 1551   Lab Results  Component Value Date   CHOL 126 09/01/2014   HDL 34.00 (L) 09/01/2014   LDLCALC 71 09/01/2014   TRIG 103.0 09/01/2014   CHOLHDL 4 09/01/2014       ASSESSMENT AND PLAN  Polyneuropathy  Lumbosacral radiculopathy at S1  Erectile dysfunction, unspecified erectile dysfunction type  Dysesthesia   1.   Renew lamotrigine 150 mg p.o. twice daily for the neuropathic pain.   2.   Hydrocodone 1-2 at bedtime for neuropathy and back pain.    45 pills needs to last 30 days.   NCCSRS reviewed and he is compliant.   No drug seeking behavior.   3.   Viagra as needed for ED 3.   rtc 6 mos or sooner if  new or worsening symptoms   Jem Castro A. Felecia Shelling, MD, PhD 06/30/4008, 2:72 PM Certified in Neurology, Clinical Neurophysiology, Sleep Medicine, Pain Medicine and Neuroimaging  [facility] [address], [place], [place] [address] [phone] =

## 2018-05-29 ENCOUNTER — Ambulatory Visit: Payer: PRIVATE HEALTH INSURANCE | Admitting: Cardiovascular Disease

## 2018-06-02 ENCOUNTER — Encounter: Payer: Self-pay | Admitting: Physician Assistant

## 2018-06-02 ENCOUNTER — Encounter (INDEPENDENT_AMBULATORY_CARE_PROVIDER_SITE_OTHER): Payer: Self-pay

## 2018-06-02 ENCOUNTER — Ambulatory Visit: Payer: PRIVATE HEALTH INSURANCE | Admitting: Physician Assistant

## 2018-06-02 VITALS — BP 120/80 | HR 63 | Ht 70.0 in | Wt 229.4 lb

## 2018-06-02 DIAGNOSIS — I10 Essential (primary) hypertension: Secondary | ICD-10-CM | POA: Diagnosis not present

## 2018-06-02 DIAGNOSIS — E782 Mixed hyperlipidemia: Secondary | ICD-10-CM

## 2018-06-02 NOTE — Progress Notes (Signed)
Cardiology Office Note    Date:  06/02/2018   ID:  Jeffrey Good, DOB [birth year], MRN [id number]  PCP:  Deatra James, MD  Cardiologist:  Dr. Elease Hashimoto  Chief Complaint: 12 Months follow up  History of Present Illness:   Jeffrey Good is a 68 y.o. male with hx of HTN and HLD presents for follow up.   He was doing well on cardiac stand point when last seen by Dr. Elease Hashimoto 04/2017. Normal LVEF by echo in 2004.  Here today for follow up.  He walks at least 2 miles each day without any limitations. The patient denies nausea, vomiting, fever, chest pain, palpitations, shortness of breath, orthopnea, PND, dizziness, syncope, cough, congestion, abdominal pain, hematochezia, melena, lower extremity edema.  Past Medical History:  Diagnosis Date  . Erectile dysfunction   . Hyperlipidemia   . Hypertension     Past Surgical History:  Procedure Laterality Date  . KNEE ARTHROSCOPY     1989 left    Current Medications: Prior to Admission medications   Medication Sig Start Date End Date Taking? Authorizing Provider  carvedilol (COREG) 25 MG tablet TAKE 1 TABLET BY MOUTH TWICE DAILY 05/20/17   Nahser, Deloris Ping, MD  Cetirizine HCl (ZYRTEC ALLERGY) 10 MG CAPS Take 10 mg by mouth daily.  11/15/08   [provider]  doxazosin (CARDURA) 8 MG tablet Take 1 tablet (8 mg total) by mouth at bedtime. 06/03/17   Nahser, Deloris Ping, MD  HYDROcodone-acetaminophen (NORCO/VICODIN) 5-325 MG tablet Take one or two po daily as needed 05/20/18   Sater, Pearletha Furl, MD  lamoTRIgine (LAMICTAL) 150 MG tablet Take 1 tablet (150 mg total) by mouth 2 (two) times daily. 05/20/18   Sater, Pearletha Furl, MD  lisinopril (PRINIVIL,ZESTRIL) 20 MG tablet Take 1 tablet (20 mg total) by mouth 2 (two) times daily. 06/03/17   Nahser, Deloris Ping, MD  meloxicam (MOBIC) 7.5 MG tablet TAKE 1 TABLET BY MOUTH EVERY DAY 11/07/17   Sater, Pearletha Furl, MD  sildenafil (VIAGRA) 100 MG tablet Take one half to one pill as needed 11/13/17   Sater, Pearletha Furl, MD  spironolactone (ALDACTONE) 25 MG tablet TAKE 1 TABLET BY MOUTH ONCE DAILY 04/08/18   Nahser, Deloris Ping, MD  potassium chloride (K-DUR,KLOR-CON) 10 MEQ tablet Take 1 tablet (10 mEq total) by mouth daily. 04/30/12 05/31/13  Nahser, Deloris Ping, MD    Allergies:   Pregabalin   Social History   Socioeconomic History  . Marital status: Married    Spouse name: Not on file  . Number of children: Not on file  . Years of education: Not on file  . Highest education level: Not on file  Occupational History  . Not on file  Social Needs  . Financial resource strain: Not on file  . Food insecurity:    Worry: Not on file    Inability: Not on file  . Transportation needs:    Medical: Not on file    Non-medical: Not on file  Tobacco Use  . Smoking status: Never Smoker  . Smokeless tobacco: Never Used  Substance and Sexual Activity  . Alcohol use: Yes    Alcohol/week: 1.0 standard drinks    Types: 1 Cans of beer per week    Comment: very rare  . Drug use: No  . Sexual activity: Yes  Lifestyle  . Physical activity:    Days per week: Not on file    Minutes per session: Not on file  .  Stress: Not on file  Relationships  . Social connections:    Talks on phone: Not on file    Gets together: Not on file    Attends religious service: Not on file    Active member of club or organization: Not on file    Attends meetings of clubs or organizations: Not on file    Relationship status: Not on file  Other Topics Concern  . Not on file  Social History Narrative  . Not on file     Family History:  The patient's family history includes Diabetes in his mother; Hypertension in his mother.  ROS:   Please see the history of present illness.    ROS All other systems reviewed and are negative.   PHYSICAL EXAM:   VS:  BP 120/80   Pulse 63   Ht 5\' 10"  (1.778 m)   Wt 229 lb 6.4 oz (104.1 kg)   SpO2 97%   BMI 32.92 kg/m    GEN: Well nourished, well developed, in no acute distress  HEENT:  normal  Neck: no JVD, carotid bruits, or masses Cardiac: RRR; no murmurs, rubs, or gallops,no edema  Respiratory:  clear to auscultation bilaterally, normal work of breathing GI: soft, nontender, nondistended, + BS MS: no deformity or atrophy  Skin: warm and dry, no rash Neuro:  Alert and Oriented x 3, Strength and sensation are intact Psych: euthymic mood, full affect  Wt Readings from Last 3 Encounters:  06/02/18 229 lb 6.4 oz (104.1 kg)  05/20/18 238 lb (108 kg)  11/13/17 220 lb (99.8 kg)      Studies/Labs Reviewed:   EKG:  EKG is ordered today.  The ekg ordered today demonstrates NSR at rate of 63 bpm.   Recent Labs: No results found for requested labs within last 8760 hours.   Lipid Panel    Component Value Date/Time   CHOL 126 09/01/2014 1551   TRIG 103.0 09/01/2014 1551   HDL 34.00 (L) 09/01/2014 1551   CHOLHDL 4 09/01/2014 1551   VLDL 20.6 09/01/2014 1551   LDLCALC 71 09/01/2014 1551    Additional studies/ records that were reviewed today include:   As above   ASSESSMENT & PLAN:   1. HTN - BP stable on current mediations. No change in therapy.   2. HLd - Followed by PCP - Note on any medications.     Medication Adjustments/Labs and Tests Ordered: Current medicines are reviewed at length with the patient today.  Concerns regarding medicines are outlined above.  Medication changes, Labs and Tests ordered today are listed in the Patient Instructions below. Patient Instructions  Medication Instructions:  Your physician recommends that you continue on your current medications as directed. Please refer to the Current Medication list given to you today.  If you need a refill on your cardiac medications before your next appointment, please call your pharmacy.   Lab work: None ordered  If you have labs (blood work) drawn today and your tests are completely normal, you will receive your results only by: Marland Kitchen MyChart Message (if you have MyChart) OR . A paper  copy in the mail If you have any lab test that is abnormal or we need to change your treatment, we will call you to review the results.  Testing/Procedures: None ordered  Follow-Up: At [facility], you and your health needs are our priority.  As part of our continuing mission to provide you with exceptional heart care, we have created designated Provider Care  Teams.  These Care Teams include your primary Cardiologist (physician) and Advanced Practice Providers (APPs -  Physician Assistants and Nurse Practitioners) who all work together to provide you with the care you need, when you need it. You will need a follow up appointment in:  12 months.  Please call our office 2 months in advance to schedule this appointment.  You may see Kristeen Miss, MD or one of the following Advanced Practice Providers on your designated Care Team: Tereso Newcomer, PA-C Vin Baxter, [place] . Berton Bon, NP  Any Other Special Instructions Will Be Listed Below (If Applicable).       Lorelei Pont, [place]  06/02/2018 1:55 PM    [facility] [address] [place], [place], [place]  [address] Phone: [phone]; Fax: [phone]

## 2018-06-02 NOTE — Patient Instructions (Signed)
Medication Instructions:  Your physician recommends that you continue on your current medications as directed. Please refer to the Current Medication list given to you today.  If you need a refill on your cardiac medications before your next appointment, please call your pharmacy.   Lab work: None ordered  If you have labs (blood work) drawn today and your tests are completely normal, you will receive your results only by: . MyChart Message (if you have MyChart) OR . A paper copy in the mail If you have any lab test that is abnormal or we need to change your treatment, we will call you to review the results.  Testing/Procedures: None ordered   Follow-Up: At [facility], you and your health needs are our priority.  As part of our continuing mission to provide you with exceptional heart care, we have created designated Provider Care Teams.  These Care Teams include your primary Cardiologist (physician) and Advanced Practice Providers (APPs -  Physician Assistants and Nurse Practitioners) who all work together to provide you with the care you need, when you need it. You will need a follow up appointment in:  12 months.  Please call our office 2 months in advance to schedule this appointment.  You may see Philip Nahser, MD or one of the following Advanced Practice Providers on your designated Care Team: Scott Weaver, PA-C Vin Bhagat, PA-C . Janine Hammond, NP  Any Other Special Instructions Will Be Listed Below (If Applicable).    

## 2018-06-07 ENCOUNTER — Other Ambulatory Visit: Payer: Self-pay | Admitting: Neurology

## 2018-06-11 ENCOUNTER — Other Ambulatory Visit: Payer: Self-pay | Admitting: Cardiovascular Disease

## 2018-06-16 ENCOUNTER — Other Ambulatory Visit: Payer: Self-pay | Admitting: Neurology

## 2018-06-16 NOTE — Telephone Encounter (Signed)
Pt request refill for HYDROcodone-acetaminophen (NORCO/VICODIN) 5-325 MG tablet sent to [id number] - [place], [place] - [address]

## 2018-06-17 MED ORDER — HYDROCODONE-ACETAMINOPHEN 5-325 MG PO TABS: ORAL_TABLET | ORAL | 0 refills | Status: DC

## 2018-07-18 ENCOUNTER — Other Ambulatory Visit: Payer: Self-pay | Admitting: Cardiovascular Disease

## 2018-07-21 ENCOUNTER — Other Ambulatory Visit: Payer: Self-pay | Admitting: Neurology

## 2018-07-21 MED ORDER — HYDROCODONE-ACETAMINOPHEN 5-325 MG PO TABS: ORAL_TABLET | ORAL | 0 refills | Status: DC

## 2018-07-21 NOTE — Telephone Encounter (Signed)
Pt called and is needing a refill on his HYDROcodone-acetaminophen (NORCO/VICODIN) 5-325 MG tablet [facility] [address]

## 2018-07-21 NOTE — Telephone Encounter (Signed)
New message    *STAT* If patient is at the pharmacy, call can be transferred to refill team.   1. Which medications need to be refilled? (please list name of each medication and dose if known) Doxazosin 8mg  and Spironolactone 25mg    2. Which pharmacy/location (including street and city if local pharmacy) is medication to be sent to? [facility] [address]  3. Do they need a 30 day or 90 day supply? 90 day supply

## 2018-08-19 ENCOUNTER — Other Ambulatory Visit: Payer: Self-pay | Admitting: Neurology

## 2018-08-19 MED ORDER — HYDROCODONE-ACETAMINOPHEN 5-325 MG PO TABS: ORAL_TABLET | ORAL | 0 refills | Status: DC

## 2018-08-19 NOTE — Addendum Note (Signed)
Addended by: Hillis Range on: 08/19/2018 04:16 PM   Modules accepted: Orders

## 2018-08-19 NOTE — Telephone Encounter (Signed)
Pt has called for a refill on his HYDROcodone-acetaminophen (NORCO/VICODIN) 5-325 MG tablet [phone]

## 2018-08-21 ENCOUNTER — Telehealth: Payer: Self-pay | Admitting: Cardiovascular Disease

## 2018-08-21 NOTE — Telephone Encounter (Signed)
 *  STAT* If patient is at the pharmacy, call can be transferred to refill team.   1. Which medications need to be refilled? (please list name of each medication and dose if known) gabapentin (NEURONTIN) 300 MG capsule  2. Which pharmacy/location (including street and city if local pharmacy) is medication to be sent to? [facility] [place]  3. Do they need a 30 day or 90 day supply? 30 days

## 2018-08-22 ENCOUNTER — Other Ambulatory Visit: Payer: Self-pay | Admitting: Neurology

## 2018-08-22 NOTE — Telephone Encounter (Signed)
Pt has called for a refill on his gabapentin (NEURONTIN) 300 MG capsule [id number]  Pt advised office is not open on Fridays at this time and to check with his pharmacy on Monday

## 2018-08-22 NOTE — Telephone Encounter (Signed)
Pt calling requesting a refill on gabapentin 300 mg capsule. This medication was prescribed at [facility] 06/02/18 by Bhagat,PA. Please address

## 2018-08-22 NOTE — Telephone Encounter (Signed)
Called pt and left message informing pt that he would have to contact his PCP for a refill on his gabapentin and if he has any other problems, questions or concerns about his cardiac medications, to give our office a call back.

## 2018-08-25 ENCOUNTER — Other Ambulatory Visit: Payer: Self-pay | Admitting: Neurology

## 2018-08-25 MED ORDER — GABAPENTIN 300 MG PO CAPS: ORAL_CAPSULE | ORAL | 5 refills | Status: DC

## 2018-08-25 MED ORDER — HYDROCODONE-ACETAMINOPHEN 5-325 MG PO TABS: ORAL_TABLET | ORAL | 0 refills | Status: DC

## 2018-08-25 NOTE — Addendum Note (Signed)
Addended by: Hillis Range on: 08/25/2018 07:57 AM   Modules accepted: Orders

## 2018-10-09 ENCOUNTER — Other Ambulatory Visit: Payer: Self-pay | Admitting: Neurology

## 2018-10-09 MED ORDER — HYDROCODONE-ACETAMINOPHEN 5-325 MG PO TABS: ORAL_TABLET | ORAL | 0 refills | Status: DC

## 2018-10-09 NOTE — Telephone Encounter (Signed)
Pt is requesting a refill of HYDROcodone-acetaminophen (NORCO/VICODIN) 5-325 MG tablet, to be [facility] to [id number] - [place], [place] - [address] 

## 2018-11-10 ENCOUNTER — Other Ambulatory Visit: Payer: Self-pay | Admitting: Neurology

## 2018-11-10 MED ORDER — HYDROCODONE-ACETAMINOPHEN 5-325 MG PO TABS: ORAL_TABLET | ORAL | 0 refills | Status: DC

## 2018-11-10 NOTE — Telephone Encounter (Signed)
Pt called needing a refill on his HYDROcodone-acetaminophen (NORCO/VICODIN) 5-325 MG tablet [facility] [address]

## 2018-11-19 ENCOUNTER — Ambulatory Visit: Payer: PRIVATE HEALTH INSURANCE | Admitting: Family Medicine

## 2018-11-19 ENCOUNTER — Other Ambulatory Visit: Payer: Self-pay

## 2018-11-19 ENCOUNTER — Encounter: Payer: Self-pay | Admitting: Family Medicine

## 2018-11-19 VITALS — BP 120/78 | HR 59 | Temp 97.8°F | Ht 70.0 in | Wt 225.0 lb

## 2018-11-19 DIAGNOSIS — M5417 Radiculopathy, lumbosacral region: Secondary | ICD-10-CM

## 2018-11-19 DIAGNOSIS — M545 Low back pain, unspecified: Secondary | ICD-10-CM

## 2018-11-19 DIAGNOSIS — R208 Other disturbances of skin sensation: Secondary | ICD-10-CM | POA: Diagnosis not present

## 2018-11-19 NOTE — Progress Notes (Signed)
PATIENT: Jeffrey Good DOB: 1950-12-05  REASON FOR VISIT: follow up HISTORY FROM: patient  Chief Complaint  Patient presents with   Follow-up    Room 11, alone. Neuropathy "doing good, feet still get hot. pain in back when he gets up)     HISTORY OF PRESENT ILLNESS: Today 11/20/18 Jeffrey Good is a 67 y.o. male here today for follow up for dysesthesias and back pain. He continues lamotrigine 161m twice daily and gabapentin 3057mBID. He is prescribed 30023mhree times daily but hasn't increased dose. He uses hydrocodone as needed for pain. Last refill 11/10/2018 for 45 tablets. He states that he usually takes 1 everyday and sometimes he takes two tablets. He feels that back pain and neuropathy are stable. He is walking about 2 miles daily for exercise. 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    HISTORY: (copied from Dr SatGarth Bignesste on 05/20/2018)  Jeffrey Good a [age] man with burning dysesthesias and back pain.      Update 05/20/2018: He is having numbness and tingling dysesthesia in his feet.    Lamotrigine 150 mg helped incompletely.   More recently, gabapentin was started and she is on 300 mg po tid.    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  The leg pain is fairly symmetric.   Pain is worse at night and when he first wakes up and sometimes after a long day of driving..  Marland Kitchene sometimes needs a hydrocodone when pain is bothering him more.      He denies weakness in his legs.    He does not note bladder changes but has ED, helped by Viagra.     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      Update 11/13/2017: He has numbness in the feet and mild dysesthesia.  Lamotrigine 150 mg twice daily has helped a lot.  He tolerates it well.  He denies any significant weakness.  He feels the  balance and gait are doing well.  He walks up to 3 miles a day and also uses an exercise bike in bad weather.  The foot pain is usually worse at night and he takes hydrocodone with benefit.  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  Viagra has been helpful.   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  He has some low back pain and feels it is doing about the same.  Moving around helps the back pain when laying down worsens it.  He notes it for at night.  He is working as a truAdministratord needs to take some breaks to move around.  Update 05/14/2017:    He feels his dysesthetic nerve pain is doing well on lamotrigine 150 mg po bid.    He tolerates it well.     He notes no worsening numbness.    He denies any change in strength.    Balance and gait are good.   He walks 3 miles and/or rides exercise bike x 45 minutes daily.      He takes hydrocodone many nights with benefit as his foot pain is worse when he lays down  His LBP is about  the same .   It bothers him more at night or when laying down.   He does better with movements.  Sitting is not as bad.  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     He also has had ED since the neuropathy started.    Viagra 50-100 mg usually helps a lot.       From 11/07/2016: Dysesthetic Pain:   He has a burning pain in both feet. He feels that this is doing better on lamotrigine 150 mg twice a day that on a lower dose. There are still some days that hurt more and hydrocodone will help. Pain is worse on the bottom of the foot than the top of the foot. He does not note any weakness. He does not feel that this has worsened.   He feels he walks well. He does not have any weakness. Balance is fine.   He has not had any bladder or bowel changes. Etiology of the neuropathy is unknown. Lab work have been normal and he does not have diabetes or other systemic illnesses .       LBP:     LBP is doing better.  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  MRI shows degenerative changes at L5-S1. Nerve conduction study / EMG showed a mild S1 chronic radiculopathy.  ED:   He reports erectile dysfunction that is helped by Viagra.    History of dysesthesia:     Early 2017, he had the onset of a burning sensation in his feet.  He did not note any change in the color of most of the toes though the tip of the big toe did seem to be a mildly different color. Sometimes, he gets a sensation of swelling in the big toes. Initially, he was tried on Lyrica but had stomach upset and stopped. Then, he was tried on gabapentin. 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 He does not think he was on either of these medications long enough to know where the not he got a benefit.  Lamotrigine combined with hydrocodone has helped best..        Studies :    ESR, cryoglobulins, SPEP/IEF and ANA were normal.    An NCV/EMG was more consistent with mild S1 chronic radiculopathy. There was not evidence of a large fiber polyneuropathy, though a small fiber polyneuropathy is still possible.   REVIEW OF SYSTEMS: Out of a complete 14 system review of symptoms, the patient complains only of the following symptoms, numbness and tingling, and all other reviewed systems are negative.   ALLERGIES: Allergies  Allergen Reactions   Pregabalin Nausea Only    HOME MEDICATIONS: Outpatient Medications Prior to Visit  Medication Sig Dispense Refill   carvedilol (COREG) 25 MG tablet Take 1 tablet by mouth twice daily 60 tablet 10   Cetirizine HCl (ZYRTEC ALLERGY) 10 MG CAPS Take 10 mg by mouth daily.      doxazosin (CARDURA) 8 MG tablet TAKE 1 TABLET BY MOUTH AT BEDTIME 30 tablet 10   gabapentin (NEURONTIN) 300 MG capsule 1 capsule by mouth three times daily 90 capsule 5    HYDROcodone-acetaminophen (NORCO/VICODIN) 5-325 MG tablet Take one or two po daily as needed 45 tablet 0   lamoTRIgine (LAMICTAL) 150 MG tablet Take 1 tablet (150 mg  total) by mouth 2 (two) times daily. 60 tablet 11   lisinopril (PRINIVIL,ZESTRIL) 20 MG tablet Take 1 tablet by mouth twice daily 60 tablet 10   meloxicam (MOBIC) 7.5 MG tablet TAKE 1 TABLET BY MOUTH EVERY DAY 30 tablet 5   sildenafil (VIAGRA) 100 MG tablet Take one half to one pill as needed 10 tablet 11   spironolactone (ALDACTONE) 25 MG tablet TAKE 1 TABLET BY MOUTH ONCE DAILY . APPOINTMENT REQUIRED FOR FUTURE REFILLS 90 tablet 3   No facility-administered medications prior to visit.     PAST MEDICAL HISTORY: Past Medical History:  Diagnosis Date   Erectile dysfunction    Hyperlipidemia    Hypertension     PAST SURGICAL HISTORY: Past Surgical History:  Procedure Laterality Date   KNEE ARTHROSCOPY     1989 left    FAMILY HISTORY: Family History  Problem Relation Age of Onset   Hypertension Mother    Diabetes Mother     SOCIAL HISTORY: Social History   Socioeconomic History   Marital status: Married    Spouse name: Not on file   Number of children: Not on file   Years of education: Not on file   Highest education level: Not on file  Occupational History   Not on file  Social Needs   Financial resource strain: Not on file   Food insecurity    Worry: Not on file    Inability: Not on file   Transportation needs    Medical: Not on file    Non-medical: Not on file  Tobacco Use   Smoking status: Never Smoker   Smokeless tobacco: Never Used  Substance and Sexual Activity   Alcohol use: Yes    Alcohol/week: 1.0 standard drinks    Types: 1 Cans of beer per week    Comment: very rare   Drug use: No   Sexual activity: Yes  Lifestyle   Physical activity    Days per week: Not on file    Minutes per session: Not on file   Stress: Not on file  Relationships   Social  connections    Talks on phone: Not on file    Gets together: Not on file    Attends religious service: Not on file    Active member of club or organization: Not on file    Attends meetings of clubs or organizations: Not on file    Relationship status: Not on file   Intimate partner violence    Fear of current or ex partner: Not on file    Emotionally abused: Not on file    Physically abused: Not on file    Forced sexual activity: Not on file  Other Topics Concern   Not on file  Social History Narrative   Not on file      PHYSICAL EXAM  Vitals:   11/19/18 1539  BP: 120/78  Pulse: (!) 59  Temp: 97.8 F (36.6 C)  Weight: 225 lb (102.1 kg)  Height: 5' 10"  (1.778 m)   Body mass index is 32.28 kg/m.  Generalized: Well developed, in no acute distress  Cardiology: normal rate and rhythm, no murmur noted Neurological examination  Mentation: Alert oriented to time, place, history taking. Follows all commands speech and language fluent Cranial nerve II-XII: Pupils were equal round reactive to light. Extraocular movements were full, visual field were full on confrontational test. Facial sensation and strength were normal. Uvula tongue midline. Head turning and shoulder shrug  were normal  and symmetric. Motor: The motor testing reveals 5 over 5 strength of all 4 extremities. Good symmetric motor tone is noted throughout.  Sensory: Sensory testing is intact to soft touch on all 4 extremities. Pinprick testing intact bilaterally. No evidence of extinction is noted.  Coordination: Cerebellar testing reveals good finger-nose-finger and heel-to-shin bilaterally.  Gait and station: Gait is normal.   DIAGNOSTIC DATA (LABS, IMAGING, TESTING) - I reviewed patient records, labs, notes, testing and imaging myself where available.  No flowsheet data found.   No results found for: WBC, HGB, HCT, MCV, PLT    Component Value Date/Time   NA 140 04/05/2016 1408   K 3.9 04/05/2016 1408   CL  104 04/05/2016 1408   CO2 30 04/05/2016 1408   GLUCOSE 84 04/05/2016 1408   BUN 8 04/05/2016 1408   CREATININE 1.16 04/05/2016 1408   CALCIUM 8.7 04/05/2016 1408   PROT 7.3 11/16/2014 1635   ALBUMIN 4.1 09/01/2014 1551   AST 17 09/01/2014 1551   ALT 20 09/01/2014 1551   ALKPHOS 73 09/01/2014 1551   BILITOT 0.4 09/01/2014 1551   Lab Results  Component Value Date   CHOL 126 09/01/2014   HDL 34.00 (L) 09/01/2014   LDLCALC 71 09/01/2014   TRIG 103.0 09/01/2014   CHOLHDL 4 09/01/2014   No results found for: HGBA1C No results found for: VITAMINB12 No results found for: TSH     ASSESSMENT AND PLAN 68 y.o. year old male  has a past medical history of Erectile dysfunction, Hyperlipidemia, and Hypertension. here with     ICD-10-CM   1. Lumbosacral radiculopathy at S1  M54.17   2. Dysesthesia  R20.8   3. Midline low back pain without sciatica, unspecified chronicity  M54.5     Jeffrey Good is doing well.  He will continue lamotrigine 150 mg twice daily.  We will also continue gabapentin.  He was advised that he can take 3 tablets daily if needed for increased symptoms.  I have advised that he try to limit use of hydrocodone.  I have encouraged him to continue daily exercise.  He may continue Viagra as needed for ED.  He will follow-up in 6 months.  He verbalizes understanding and agreement with this plan.   No orders of the defined types were placed in this encounter.    No orders of the defined types were placed in this encounter.     I spent 15 minutes with the patient. 50% of this time was spent counseling and educating patient on plan of care and medications.    Debbora Presto, FNP-C 11/20/2018, 8:41 AM [facility] [address] [place], [place] [address] [phone]

## 2018-11-19 NOTE — Patient Instructions (Signed)
Continue current treatment plan  Follow up in 6 months   Neuropathic Pain Neuropathic pain is pain caused by damage to the nerves that are responsible for certain sensations in your body (sensory nerves). The pain can be caused by:  Damage to the sensory nerves that send signals to your spinal cord and brain (peripheral nervous system).  Damage to the sensory nerves in your brain or spinal cord (central nervous system). Neuropathic pain can make you more sensitive to pain. Even a minor sensation can feel very painful. This is usually a long-term condition that can be difficult to treat. The type of pain differs from person to person. It may:  Start suddenly (acute), or it may develop slowly and last for a long time (chronic).  Come and go as damaged nerves heal, or it may stay at the same level for years.  Cause emotional distress, loss of sleep, and a lower quality of life. What are the causes? The most common cause of this condition is diabetes. Many other diseases and conditions can also cause neuropathic pain. Causes of neuropathic pain can be classified as:  Toxic. This is caused by medicines and chemicals. The most common cause of toxic neuropathic pain is damage from cancer treatments (chemotherapy).  Metabolic. This can be caused by: ? Diabetes. This is the most common disease that damages the nerves. ? Lack of vitamin B from long-term alcohol abuse.  Traumatic. Any injury that cuts, crushes, or stretches a nerve can cause damage and pain. A common example is feeling pain after losing an arm or leg (phantom limb pain).  Compression-related. If a sensory nerve gets trapped or compressed for a long period of time, the blood supply to the nerve can be cut off.  Vascular. Many blood vessel diseases can cause neuropathic pain by decreasing blood supply and oxygen to nerves.  Autoimmune. This type of pain results from diseases in which the body's defense system (immune system)  mistakenly attacks sensory nerves. Examples of autoimmune diseases that can cause neuropathic pain include lupus and multiple sclerosis.  Infectious. Many types of viral infections can damage sensory nerves and cause pain. Shingles infection is a common cause of this type of pain.  Inherited. Neuropathic pain can be a symptom of many diseases that are passed down through families (genetic). What increases the risk? You are more likely to develop this condition if:  You have diabetes.  You smoke.  You drink too much alcohol.  You are taking certain medicines, including medicines that kill cancer cells (chemotherapy) or that treat immune system disorders. What are the signs or symptoms? The main symptom is pain. Neuropathic pain is often described as:  Burning.  Shock-like.  Stinging.  Hot or cold.  Itching. How is this diagnosed? No single test can diagnose neuropathic pain. It is diagnosed based on:  Physical exam and your symptoms. Your health care provider will ask you about your pain. You may be asked to use a pain scale to describe how bad your pain is.  Tests. These may be done to see if you have a high sensitivity to pain and to help find the cause and location of any sensory nerve damage. They include: ? Nerve conduction studies to test how well nerve signals travel through your sensory nerves (electrodiagnostic testing). ? Stimulating your sensory nerves through electrodes on your skin and measuring the response in your spinal cord and brain (somatosensory evoked potential).  Imaging studies, such as: ? X-rays. ? CT scan. ?  MRI. How is this treated? Treatment for neuropathic pain may change over time. You may need to try different treatment options or a combination of treatments. Some options include:  Treating the underlying cause of the neuropathy, such as diabetes, kidney disease, or vitamin deficiencies.  Stopping medicines that can cause neuropathy, such as  chemotherapy.  Medicine to relieve pain. Medicines may include: ? Prescription or over-the-counter pain medicine. ? Anti-seizure medicine. ? Antidepressant medicines. ? Pain-relieving patches that are applied to painful areas of skin. ? A medicine to numb the area (local anesthetic), which can be injected as a nerve block.  Transcutaneous nerve stimulation. This uses electrical currents to block painful nerve signals. The treatment is painless.  Alternative treatments, such as: ? Acupuncture. ? Meditation. ? Massage. ? Physical therapy. ? Pain management programs. ? Counseling. Follow these instructions at home: Medicines   Take over-the-counter and prescription medicines only as told by your health care provider.  Do not drive or use heavy machinery while taking prescription pain medicine.  If you are taking prescription pain medicine, take actions to prevent or treat constipation. Your health care provider may recommend that you: ? Drink enough fluid to keep your urine pale yellow. ? Eat foods that are high in fiber, such as fresh fruits and vegetables, whole grains, and beans. ? Limit foods that are high in fat and processed sugars, such as fried or sweet foods. ? Take an over-the-counter or prescription medicine for constipation. Lifestyle   Have a good support system at home.  Consider joining a chronic pain support group.  Do not use any products that contain nicotine or tobacco, such as cigarettes and e-cigarettes. If you need help quitting, ask your health care provider.  Do not drink alcohol. General instructions  Learn as much as you can about your condition.  Work closely with all your health care providers to find the treatment plan that works best for you.  Ask your health care provider what activities are safe for you.  Keep all follow-up visits as told by your health care provider. This is important. Contact a health care provider if:  Your pain  treatments are not working.  You are having side effects from your medicines.  You are struggling with tiredness (fatigue), mood changes, depression, or anxiety. Summary  Neuropathic pain is pain caused by damage to the nerves that are responsible for certain sensations in your body (sensory nerves).  Neuropathic pain may come and go as damaged nerves heal, or it may stay at the same level for years.  Neuropathic pain is usually a long-term condition that can be difficult to treat. Consider joining a chronic pain support group. This information is not intended to replace advice given to you by your health care provider. Make sure you discuss any questions you have with your health care provider. Document Released: 12/22/2003 Document Revised: 07/17/2018 Document Reviewed: 04/12/2017 Elsevier Patient Education  2020 [employer]

## 2018-11-20 ENCOUNTER — Encounter: Payer: Self-pay | Admitting: Family Medicine

## 2018-11-20 NOTE — Progress Notes (Signed)
I have read the note, and I agree with the clinical assessment and plan.  Artrell Lawless A. Nikkolas Coomes, MD, PhD, FAAN Certified in Neurology, Clinical Neurophysiology, Sleep Medicine, Pain Medicine and Neuroimaging  [facility] [address], [address] [place], [place] [address] [phone]  

## 2018-11-25 ENCOUNTER — Other Ambulatory Visit: Payer: Self-pay | Admitting: Neurology

## 2018-11-26 ENCOUNTER — Telehealth: Payer: Self-pay | Admitting: Family Medicine

## 2018-11-26 MED ORDER — MELOXICAM 7.5 MG PO TABS: 7.5000 mg | ORAL_TABLET | Freq: Every day | ORAL | 5 refills | Status: DC

## 2018-11-26 NOTE — Telephone Encounter (Signed)
I called pt has he had picked up his norco on 11-10-18 and was not ready to be filled again until 12-11-18.  Pt stated that it was the meloxicam that he needed.  I stated that will refill.

## 2018-11-26 NOTE — Telephone Encounter (Signed)
Pt is needing a refill for his HYDROcodone-acetaminophen (NORCO/VICODIN) 5-325 MG tablet sent to [facility] on [address].

## 2018-11-29 ENCOUNTER — Other Ambulatory Visit: Payer: Self-pay | Admitting: Neurology

## 2018-12-10 ENCOUNTER — Other Ambulatory Visit: Payer: Self-pay | Admitting: Family Medicine

## 2018-12-10 MED ORDER — HYDROCODONE-ACETAMINOPHEN 5-325 MG PO TABS: ORAL_TABLET | ORAL | 0 refills | Status: DC

## 2018-12-10 NOTE — Telephone Encounter (Signed)
Drug registry checked, last fill #45 Norco take 1-2 daily prn  11-10-18.  Last seen with Amy/NP 11-19-18.

## 2018-12-10 NOTE — Addendum Note (Signed)
Addended by: Brandon Melnick on: 12/10/2018 10:47 AM   Modules accepted: Orders

## 2018-12-10 NOTE — Telephone Encounter (Signed)
Pt has called for a refill on HYDROcodone-acetaminophen (NORCO/VICODIN) 5-325 MG tablet [id number]

## 2018-12-21 ENCOUNTER — Other Ambulatory Visit: Payer: Self-pay | Admitting: Neurology

## 2019-01-08 ENCOUNTER — Other Ambulatory Visit: Payer: Self-pay | Admitting: Neurology

## 2019-01-08 MED ORDER — HYDROCODONE-ACETAMINOPHEN 5-325 MG PO TABS: ORAL_TABLET | ORAL | 0 refills | Status: DC

## 2019-01-08 NOTE — Telephone Encounter (Signed)
Pt is needing a refill on his HYDROcodone-acetaminophen (NORCO/VICODIN) 5-325 MG tablet sent in to the [facility] on [address] 

## 2019-02-09 ENCOUNTER — Other Ambulatory Visit: Payer: Self-pay | Admitting: Family Medicine

## 2019-02-09 MED ORDER — HYDROCODONE-ACETAMINOPHEN 5-325 MG PO TABS: ORAL_TABLET | ORAL | 0 refills | Status: DC

## 2019-02-09 NOTE — Telephone Encounter (Signed)
Pt is requesting a refill of HYDROcodone-acetaminophen (NORCO/VICODIN) 5-325 MG tablet, to be [facility] to [id number] [place], [place] - [address] 

## 2019-02-09 NOTE — Telephone Encounter (Signed)
Drug registry checked last refill 01-08-19 #45 Hydrocodone.

## 2019-03-11 ENCOUNTER — Other Ambulatory Visit: Payer: Self-pay | Admitting: Family Medicine

## 2019-03-11 NOTE — Telephone Encounter (Signed)
Pt is needing a refill on his HYDROcodone-acetaminophen (NORCO/VICODIN) 5-325 MG tablet [facility] to the [facility] on [address]

## 2019-03-12 NOTE — Telephone Encounter (Signed)
Please review previous message.   Sent to NP Amy Lomax. NP stated to route Dr.Sater.

## 2019-03-12 NOTE — Addendum Note (Signed)
Addended by: Hope Pigeon on: 03/12/2019 02:05 PM   Modules accepted: Orders

## 2019-03-13 MED ORDER — HYDROCODONE-ACETAMINOPHEN 5-325 MG PO TABS: ORAL_TABLET | ORAL | 0 refills | Status: DC

## 2019-04-13 ENCOUNTER — Other Ambulatory Visit: Payer: Self-pay | Admitting: Neurology

## 2019-04-13 MED ORDER — HYDROCODONE-ACETAMINOPHEN 5-325 MG PO TABS: ORAL_TABLET | ORAL | 0 refills | Status: DC

## 2019-04-13 NOTE — Telephone Encounter (Signed)
Pt is needing a refill on his HYDROcodone-acetaminophen (NORCO/VICODIN) 5-325 MG tablet [facility] [address]

## 2019-04-28 ENCOUNTER — Other Ambulatory Visit: Payer: Self-pay | Admitting: Neurology

## 2019-05-14 ENCOUNTER — Other Ambulatory Visit: Payer: Self-pay | Admitting: Family Medicine

## 2019-05-14 MED ORDER — HYDROCODONE-ACETAMINOPHEN 5-325 MG PO TABS: ORAL_TABLET | ORAL | 0 refills | Status: DC

## 2019-05-14 NOTE — Telephone Encounter (Signed)
[facility] Database Verified LR: 04-13-2019 Qty: 45 Pending appointment: 05-28-2019(Amy)

## 2019-05-14 NOTE — Telephone Encounter (Signed)
Pt is requesting a refill of HYDROcodone-acetaminophen (NORCO/VICODIN) 5-325 MG tablet, to [id number] [place], [place] - [address]

## 2019-05-28 ENCOUNTER — Ambulatory Visit: Payer: PRIVATE HEALTH INSURANCE | Admitting: Family Medicine

## 2019-06-03 ENCOUNTER — Other Ambulatory Visit: Payer: Self-pay | Admitting: Cardiovascular Disease

## 2019-06-03 ENCOUNTER — Other Ambulatory Visit: Payer: Self-pay

## 2019-06-03 MED ORDER — LISINOPRIL 20 MG PO TABS: 20.0000 mg | ORAL_TABLET | Freq: Two times a day (BID) | ORAL | 0 refills | Status: DC

## 2019-06-03 MED ORDER — DOXAZOSIN MESYLATE 8 MG PO TABS: 8.0000 mg | ORAL_TABLET | Freq: Every day | ORAL | 0 refills | Status: DC

## 2019-06-10 ENCOUNTER — Other Ambulatory Visit: Payer: Self-pay | Admitting: Cardiovascular Disease

## 2019-06-18 ENCOUNTER — Other Ambulatory Visit: Payer: Self-pay | Admitting: Family Medicine

## 2019-06-18 MED ORDER — HYDROCODONE-ACETAMINOPHEN 5-325 MG PO TABS: ORAL_TABLET | ORAL | 0 refills | Status: DC

## 2019-06-18 NOTE — Telephone Encounter (Signed)
Pt called needing a refill on his HYDROcodone-acetaminophen (NORCO/VICODIN) 5-325 MG tablet [facility] [address]  

## 2019-06-26 ENCOUNTER — Encounter (HOSPITAL_COMMUNITY): Payer: Self-pay

## 2019-06-26 ENCOUNTER — Emergency Department (HOSPITAL_COMMUNITY): Payer: PRIVATE HEALTH INSURANCE

## 2019-06-26 ENCOUNTER — Other Ambulatory Visit: Payer: Self-pay

## 2019-06-26 ENCOUNTER — Inpatient Hospital Stay (HOSPITAL_COMMUNITY)
Admission: EM | Admit: 2019-06-26 | Discharge: 2019-07-04 | DRG: 871 | Disposition: A | Discharge status: Home / Self Care | Payer: PRIVATE HEALTH INSURANCE | Attending: Surgery | Admitting: Surgery

## 2019-06-26 DIAGNOSIS — I10 Essential (primary) hypertension: Secondary | ICD-10-CM | POA: Diagnosis present

## 2019-06-26 DIAGNOSIS — E785 Hyperlipidemia, unspecified: Secondary | ICD-10-CM | POA: Diagnosis present

## 2019-06-26 DIAGNOSIS — K3533 Acute appendicitis with perforation and localized peritonitis, with abscess: Secondary | ICD-10-CM | POA: Diagnosis present

## 2019-06-26 DIAGNOSIS — R109 Unspecified abdominal pain: Secondary | ICD-10-CM | POA: Diagnosis present

## 2019-06-26 DIAGNOSIS — Z20822 Contact with and (suspected) exposure to covid-19: Secondary | ICD-10-CM | POA: Diagnosis present

## 2019-06-26 DIAGNOSIS — Z79899 Other long term (current) drug therapy: Secondary | ICD-10-CM | POA: Diagnosis not present

## 2019-06-26 DIAGNOSIS — A419 Sepsis, unspecified organism: Secondary | ICD-10-CM | POA: Diagnosis present

## 2019-06-26 DIAGNOSIS — K219 Gastro-esophageal reflux disease without esophagitis: Secondary | ICD-10-CM | POA: Diagnosis present

## 2019-06-26 DIAGNOSIS — G8929 Other chronic pain: Secondary | ICD-10-CM | POA: Diagnosis present

## 2019-06-26 DIAGNOSIS — Z791 Long term (current) use of non-steroidal anti-inflammatories (NSAID): Secondary | ICD-10-CM | POA: Diagnosis not present

## 2019-06-26 DIAGNOSIS — M5417 Radiculopathy, lumbosacral region: Secondary | ICD-10-CM | POA: Diagnosis present

## 2019-06-26 DIAGNOSIS — K567 Ileus, unspecified: Secondary | ICD-10-CM | POA: Diagnosis present

## 2019-06-26 DIAGNOSIS — K3532 Acute appendicitis with perforation and localized peritonitis, without abscess: Secondary | ICD-10-CM

## 2019-06-26 DIAGNOSIS — K651 Peritoneal abscess: Secondary | ICD-10-CM

## 2019-06-26 DIAGNOSIS — Z0189 Encounter for other specified special examinations: Secondary | ICD-10-CM

## 2019-06-26 LAB — URINALYSIS, ROUTINE W REFLEX MICROSCOPIC
Bilirubin Urine: NEGATIVE
Glucose, UA: NEGATIVE mg/dL
Ketones, ur: NEGATIVE mg/dL
Leukocytes,Ua: NEGATIVE
Nitrite: NEGATIVE
Protein, ur: NEGATIVE mg/dL
Specific Gravity, Urine: 1.035 — ABNORMAL HIGH (ref 1.005–1.030)
pH: 5 (ref 5.0–8.0)

## 2019-06-26 LAB — CBC
HCT: 35.2 % — ABNORMAL LOW (ref 39.0–52.0)
Hemoglobin: 11.4 g/dL — ABNORMAL LOW (ref 13.0–17.0)
MCH: 29.5 pg (ref 26.0–34.0)
MCHC: 32.4 g/dL (ref 30.0–36.0)
MCV: 91 fL (ref 80.0–100.0)
Platelets: 135 10*3/uL — ABNORMAL LOW (ref 150–400)
RBC: 3.87 MIL/uL — ABNORMAL LOW (ref 4.22–5.81)
RDW: 13.3 % (ref 11.5–15.5)
WBC: 7.1 10*3/uL (ref 4.0–10.5)
nRBC: 0 % (ref 0.0–0.2)

## 2019-06-26 LAB — CREATININE, SERUM
Creatinine, Ser: 1.14 mg/dL (ref 0.61–1.24)
GFR calc Af Amer: >60 mL/min (ref >60)
GFR calc non Af Amer: >60 mL/min (ref >60)

## 2019-06-26 LAB — RESPIRATORY PANEL BY RT PCR (FLU A&B, COVID)
Influenza A by PCR: NEGATIVE
Influenza B by PCR: NEGATIVE
SARS Coronavirus 2 by RT PCR: NEGATIVE

## 2019-06-26 LAB — COMPREHENSIVE METABOLIC PANEL
ALT: 17 U/L (ref 0–44)
AST: 19 U/L (ref 15–41)
Albumin: 3.7 g/dL (ref 3.5–5.0)
Alkaline Phosphatase: 43 U/L (ref 38–126)
Anion gap: 10 (ref 5–15)
BUN: 17 mg/dL (ref 8–23)
CO2: 23 mmol/L (ref 22–32)
Calcium: 8.7 mg/dL — ABNORMAL LOW (ref 8.9–10.3)
Chloride: 102 mmol/L (ref 98–111)
Creatinine, Ser: 1.03 mg/dL (ref 0.61–1.24)
GFR calc Af Amer: >60 mL/min (ref >60)
GFR calc non Af Amer: >60 mL/min (ref >60)
Glucose, Bld: 136 mg/dL — ABNORMAL HIGH (ref 70–99)
Potassium: 4 mmol/L (ref 3.5–5.1)
Sodium: 135 mmol/L (ref 135–145)
Total Bilirubin: 0.5 mg/dL (ref 0.3–1.2)
Total Protein: 7.7 g/dL (ref 6.5–8.1)

## 2019-06-26 LAB — CBC WITH DIFFERENTIAL/PLATELET
Abs Immature Granulocytes: 0.03 10*3/uL (ref 0.00–0.07)
Basophils Absolute: 0 10*3/uL (ref 0.0–0.1)
Basophils Relative: 0 %
Eosinophils Absolute: 0 10*3/uL (ref 0.0–0.5)
Eosinophils Relative: 0 %
HCT: 40.2 % (ref 39.0–52.0)
Hemoglobin: 13.1 g/dL (ref 13.0–17.0)
Immature Granulocytes: 1 %
Lymphocytes Relative: 11 %
Lymphs Abs: 0.7 10*3/uL (ref 0.7–4.0)
MCH: 29.4 pg (ref 26.0–34.0)
MCHC: 32.6 g/dL (ref 30.0–36.0)
MCV: 90.3 fL (ref 80.0–100.0)
Monocytes Absolute: 0.3 10*3/uL (ref 0.1–1.0)
Monocytes Relative: 4 %
Neutro Abs: 5.5 10*3/uL (ref 1.7–7.7)
Neutrophils Relative %: 84 %
Platelets: 144 10*3/uL — ABNORMAL LOW (ref 150–400)
RBC: 4.45 MIL/uL (ref 4.22–5.81)
RDW: 13.3 % (ref 11.5–15.5)
WBC: 6.4 10*3/uL (ref 4.0–10.5)
nRBC: 0 % (ref 0.0–0.2)

## 2019-06-26 LAB — LACTIC ACID, PLASMA
Lactic Acid, Venous: 2.2 mmol/L (ref 0.5–1.9)
Lactic Acid, Venous: 1.7 mmol/L (ref 0.5–1.9)

## 2019-06-26 LAB — PROTIME-INR
INR: 1 (ref 0.8–1.2)
Prothrombin Time: 13.1 seconds (ref 11.4–15.2)

## 2019-06-26 LAB — LIPASE, BLOOD: Lipase: 31 U/L (ref 11–51)

## 2019-06-26 LAB — HIV ANTIBODY (ROUTINE TESTING W REFLEX): HIV Screen 4th Generation wRfx: NONREACTIVE

## 2019-06-26 MED ORDER — SODIUM CHLORIDE 0.9 % IV BOLUS (SEPSIS)
1000.0000 mL | Freq: Once | INTRAVENOUS | Status: AC
  Administered 2019-06-26: 1000 mL via INTRAVENOUS

## 2019-06-26 MED ORDER — SODIUM CHLORIDE (PF) 0.9 % IJ SOLN
INTRAMUSCULAR | Status: AC
  Filled 2019-06-26: qty 50

## 2019-06-26 MED ORDER — CARVEDILOL 25 MG PO TABS
25.0000 mg | ORAL_TABLET | Freq: Two times a day (BID) | ORAL | Status: DC
  Administered 2019-06-27 – 2019-07-04 (×14): 25 mg via ORAL
  Filled 2019-06-26 (×16): qty 1

## 2019-06-26 MED ORDER — DIPHENHYDRAMINE HCL 12.5 MG/5ML PO ELIX
12.5000 mg | ORAL_SOLUTION | Freq: Four times a day (QID) | ORAL | Status: DC | PRN
  Administered 2019-06-30 – 2019-07-01 (×2): 12.5 mg via ORAL
  Filled 2019-06-26 (×2): qty 5

## 2019-06-26 MED ORDER — ONDANSETRON HCL 4 MG/2ML IJ SOLN
4.0000 mg | Freq: Four times a day (QID) | INTRAMUSCULAR | Status: DC | PRN
  Administered 2019-06-28 – 2019-06-29 (×3): 4 mg via INTRAVENOUS
  Filled 2019-06-26 (×2): qty 2

## 2019-06-26 MED ORDER — ENOXAPARIN SODIUM 40 MG/0.4ML ~~LOC~~ SOLN
40.0000 mg | SUBCUTANEOUS | Status: DC
  Administered 2019-06-27 – 2019-06-29 (×3): 40 mg via SUBCUTANEOUS
  Filled 2019-06-26 (×4): qty 0.4

## 2019-06-26 MED ORDER — MORPHINE SULFATE (PF) 4 MG/ML IV SOLN
4.0000 mg | Freq: Once | INTRAVENOUS | Status: AC
  Administered 2019-06-26: 4 mg via INTRAVENOUS
  Filled 2019-06-26: qty 1

## 2019-06-26 MED ORDER — SODIUM CHLORIDE 0.9 % IV BOLUS
1000.0000 mL | Freq: Once | INTRAVENOUS | Status: AC
  Administered 2019-06-26: 1000 mL via INTRAVENOUS

## 2019-06-26 MED ORDER — DIPHENHYDRAMINE HCL 50 MG/ML IJ SOLN: 12.5000 mg | Freq: Four times a day (QID) | INTRAMUSCULAR | Status: DC | PRN

## 2019-06-26 MED ORDER — POTASSIUM CHLORIDE IN NACL 20-0.9 MEQ/L-% IV SOLN
INTRAVENOUS | Status: DC
  Filled 2019-06-26 (×13): qty 1000

## 2019-06-26 MED ORDER — ONDANSETRON 4 MG PO TBDP: 4.0000 mg | ORAL_TABLET | Freq: Four times a day (QID) | ORAL | Status: DC | PRN

## 2019-06-26 MED ORDER — GABAPENTIN 300 MG PO CAPS
300.0000 mg | ORAL_CAPSULE | Freq: Three times a day (TID) | ORAL | Status: DC
  Administered 2019-06-26 – 2019-06-29 (×10): 300 mg via ORAL
  Filled 2019-06-26 (×12): qty 1

## 2019-06-26 MED ORDER — HYDROCODONE-ACETAMINOPHEN 5-325 MG PO TABS
1.0000 | ORAL_TABLET | Freq: Every day | ORAL | Status: DC | PRN
  Administered 2019-06-26 – 2019-07-03 (×5): 2 via ORAL
  Filled 2019-06-26 (×5): qty 2

## 2019-06-26 MED ORDER — SPIRONOLACTONE 25 MG PO TABS
25.0000 mg | ORAL_TABLET | Freq: Every day | ORAL | Status: DC
  Administered 2019-06-26 – 2019-07-04 (×9): 25 mg via ORAL
  Filled 2019-06-26 (×9): qty 1

## 2019-06-26 MED ORDER — ACETAMINOPHEN 325 MG PO TABS
650.0000 mg | ORAL_TABLET | Freq: Four times a day (QID) | ORAL | Status: DC | PRN
  Administered 2019-06-26 – 2019-07-02 (×5): 650 mg via ORAL
  Filled 2019-06-26 (×5): qty 2

## 2019-06-26 MED ORDER — DOXAZOSIN MESYLATE 8 MG PO TABS
8.0000 mg | ORAL_TABLET | Freq: Every day | ORAL | Status: DC
  Administered 2019-06-27 – 2019-07-03 (×6): 8 mg via ORAL
  Filled 2019-06-26 (×8): qty 1

## 2019-06-26 MED ORDER — SODIUM CHLORIDE 0.9 % IV SOLN
2.0000 g | Freq: Once | INTRAVENOUS | Status: AC
  Administered 2019-06-26: 2 g via INTRAVENOUS
  Filled 2019-06-26: qty 20

## 2019-06-26 MED ORDER — ENOXAPARIN SODIUM 30 MG/0.3ML ~~LOC~~ SOLN: 30.0000 mg | SUBCUTANEOUS | Status: DC

## 2019-06-26 MED ORDER — LAMOTRIGINE 25 MG PO TABS
150.0000 mg | ORAL_TABLET | Freq: Two times a day (BID) | ORAL | Status: DC
  Administered 2019-06-26 – 2019-07-04 (×15): 150 mg via ORAL
  Filled 2019-06-26: qty 2
  Filled 2019-06-26: qty 1
  Filled 2019-06-26 (×7): qty 2
  Filled 2019-06-26: qty 1
  Filled 2019-06-26: qty 2
  Filled 2019-06-26 (×2): qty 1
  Filled 2019-06-26 (×3): qty 2

## 2019-06-26 MED ORDER — METRONIDAZOLE IN NACL 5-0.79 MG/ML-% IV SOLN
500.0000 mg | Freq: Once | INTRAVENOUS | Status: AC
  Administered 2019-06-26: 500 mg via INTRAVENOUS
  Filled 2019-06-26: qty 100

## 2019-06-26 MED ORDER — ACETAMINOPHEN 325 MG PO TABS
650.0000 mg | ORAL_TABLET | Freq: Once | ORAL | Status: AC
  Administered 2019-06-26: 650 mg via ORAL
  Filled 2019-06-26: qty 2

## 2019-06-26 MED ORDER — ONDANSETRON HCL 4 MG/2ML IJ SOLN
4.0000 mg | Freq: Once | INTRAMUSCULAR | Status: AC
  Administered 2019-06-26: 4 mg via INTRAVENOUS
  Filled 2019-06-26: qty 2

## 2019-06-26 MED ORDER — ACETAMINOPHEN 650 MG RE SUPP: 650.0000 mg | Freq: Four times a day (QID) | RECTAL | Status: DC | PRN

## 2019-06-26 MED ORDER — IOHEXOL 300 MG/ML  SOLN
100.0000 mL | Freq: Once | INTRAMUSCULAR | Status: AC | PRN
  Administered 2019-06-26: 100 mL via INTRAVENOUS

## 2019-06-26 MED ORDER — PIPERACILLIN-TAZOBACTAM 3.375 G IVPB
3.3750 g | Freq: Three times a day (TID) | INTRAVENOUS | Status: DC
  Administered 2019-06-26 – 2019-07-03 (×21): 3.375 g via INTRAVENOUS
  Filled 2019-06-26 (×21): qty 50

## 2019-06-26 MED ORDER — MORPHINE SULFATE (PF) 2 MG/ML IV SOLN
2.0000 mg | INTRAVENOUS | Status: DC | PRN
  Administered 2019-06-27 (×2): 2 mg via INTRAVENOUS
  Filled 2019-06-26 (×2): qty 1

## 2019-06-26 NOTE — Progress Notes (Signed)
Pharmacy Antibiotic Note  Jeffrey Good is a 69 y.o. 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  Pharmacy has been consulted for Zosyn dosing.  Plan: Zosyn 3.375g IV q8h (4 hour infusion).  Height: 5\' 10"  (177.8 cm) Weight: 226 lb (102.5 kg) IBW/kg (Calculated) : 73  Temp (24hrs), Avg:101.6 F (38.7 C), Min:101.1 F (38.4 C), Max:102.6 F (39.2 C)  Recent Labs  Lab 06/26/19 0714 06/26/19 0715  WBC 6.4  --   CREATININE 1.03  --   LATICACIDVEN  --  2.2*    Estimated Creatinine Clearance: 84.6 mL/min (by C-G formula based on SCr of 1.03 mg/dL).    Allergies  Allergen Reactions  . Pregabalin Nausea Only    Pt states he doesn't recall this allergy   Antimicrobials this admission: 3/19 Rocephin/Flagyl x1 3/19 Zosyn >>   Dose adjustments this admission:  Microbiology results: 3/19 BCx: sent 3/19 UCx: sent  3/19 Flu PCR: neg/neg 3/19 Covid: neg  Thank you for allowing pharmacy to be a part of this patient's care.  4/19 06/26/2019 11:56 AM

## 2019-06-26 NOTE — Plan of Care (Signed)
  Pt arrived to the floor alert and oriented, pt stated his pain number is a 4/10 and is tolerable. Pt MEWS is 3 but this is an improving score from when he was in the ED. MD is aware and repeat lactic is pending. Will continue to monitor patient.

## 2019-06-26 NOTE — ED Notes (Signed)
Tammy (CT) will take patient for scan once Rocephin antibiotic is finished at 0920. Rayfield Citizen, RN will let Tammy know. Will continue to monitor.

## 2019-06-26 NOTE — ED Notes (Signed)
Date and time results received: 06/26/19 8:29 AM  (use smartphrase ".now" to insert current time)  Test: Lactic Critical Value: 2.2  Name of Provider Notified: Dr. Charm Barges  Orders Received? Or Actions Taken?: See Chart

## 2019-06-26 NOTE — ED Notes (Signed)
Pt aware of urine sample. Will info staff when able to provide. Pt does have a urinal

## 2019-06-26 NOTE — ED Triage Notes (Signed)
Bilateral upper quadrant pain since yesterday afternoon. Worsened through the night. 98/60 HR 110 18g RAC 500 cc ns. ST EKG. Hx HTN, eating and drinking okay. First COVID shot Tuesday, one bout of diarrhea since, no further problems.

## 2019-06-26 NOTE — Sepsis Progress Note (Signed)
Notified bedside nurse of need to draw repeat lactic acid. Pt in process of transferring to floor bed. Will contact them after pt settled.

## 2019-06-26 NOTE — ED Provider Notes (Signed)
[facility]-EMERGENCY DEPT Provider Note   CSN: [id number] Arrival date & time: 06/26/19  3154     History Chief Complaint  Patient presents with  . Abdominal Pain    Jeffrey Good is a 69 y.o. male.  He has a history of hypertension and hyperlipidemia.  Complaining of 6 out of 10 midepigastric abdominal pain starting yesterday afternoon.  Associated with some nausea, no vomiting diarrhea or constipation.  No urinary symptoms.  No cough or chest pain, no shortness of breath.  Did not realize he was febrile, no chills.  No prior history of similar pain.  Does not radiate anywhere.  Recently got his first Covid shot.  EMS said his blood pressure was low and they gave him 500 cc of fluid.  The history is provided by the patient.  Abdominal Pain Pain location:  Periumbilical Pain quality: aching   Pain radiates to:  Does not radiate Pain severity:  Moderate Onset quality:  Gradual Duration:  20 hours Timing:  Constant Progression:  Worsening Chronicity:  New Context: not sick contacts and not trauma   Relieved by:  Nothing Worsened by:  Nothing Ineffective treatments:  None tried Associated symptoms: fever and nausea   Associated symptoms: no anorexia, no chest pain, no chills, no constipation, no cough, no diarrhea, no dysuria, no hematemesis, no hematochezia, no hematuria, no melena, no shortness of breath, no sore throat and no vomiting        Past Medical History:  Diagnosis Date  . Erectile dysfunction   . Hyperlipidemia   . Hypertension     Patient Active Problem List   Diagnosis Date Noted  . Low back pain without sciatica 11/03/2015  . Pain in joint, lower leg 04/07/2015  . Lumbosacral radiculopathy at S1 04/07/2015  . Essential (primary) hypertension 11/16/2014  . Pure hypercholesterolemia 11/16/2014  . Dysesthesia 11/16/2014  . Polyneuropathy 10/04/2014  . Hyperlipidemia 08/06/2014  . HTN (hypertension) 07/06/2010  . Erectile  dysfunction 07/06/2010    Past Surgical History:  Procedure Laterality Date  . KNEE ARTHROSCOPY     1989 left       Family History  Problem Relation Age of Onset  . Hypertension Mother   . Diabetes Mother     Social History   Tobacco Use  . Smoking status: Never Smoker  . Smokeless tobacco: Never Used  Substance Use Topics  . Alcohol use: Yes    Alcohol/week: 1.0 standard drinks    Types: 1 Cans of beer per week    Comment: very rare  . Drug use: No    Home Medications Prior to Admission medications   Medication Sig Start Date End Date Taking? Authorizing Provider  carvedilol (COREG) 25 MG tablet Take 1 tablet by mouth twice daily 06/03/19   Nahser, Deloris Ping, MD  Cetirizine HCl (ZYRTEC ALLERGY) 10 MG CAPS Take 10 mg by mouth daily.  11/15/08   [provider]  doxazosin (CARDURA) 8 MG tablet Take 1 tablet (8 mg total) by mouth at bedtime. 06/03/19   Nahser, Deloris Ping, MD  gabapentin (NEURONTIN) 300 MG capsule TAKE 1 CAPSULE BY MOUTH THREE TIMES A DAY 12/22/18   Lomax, Amy, NP  HYDROcodone-acetaminophen (NORCO/VICODIN) 5-325 MG tablet Take one or two po daily as needed 06/18/19   Sater, Pearletha Furl, MD  lamoTRIgine (LAMICTAL) 150 MG tablet TAKE 1 TABLET BY MOUTH TWICE A DAY 04/28/19   Lomax, Amy, NP  lisinopril (ZESTRIL) 20 MG tablet Take 1 tablet by mouth  twice daily 06/11/19   Nahser, Wonda Cheng, MD  meloxicam (MOBIC) 7.5 MG tablet Take 1 tablet (7.5 mg total) by mouth daily. 11/26/18   Lomax, Amy, NP  sildenafil (VIAGRA) 100 MG tablet TAKE ONE HALF TO ONE PILL AS NEEDED 12/01/18   Lomax, Amy, NP  spironolactone (ALDACTONE) 25 MG tablet TAKE 1 TABLET BY MOUTH ONCE DAILY . APPOINTMENT REQUIRED FOR FUTURE REFILLS 07/21/18   Nahser, Wonda Cheng, MD  potassium chloride (K-DUR,KLOR-CON) 10 MEQ tablet Take 1 tablet (10 mEq total) by mouth daily. 04/30/12 05/31/13  Nahser, Wonda Cheng, MD    Allergies    Pregabalin  Review of Systems   Review of Systems  Constitutional: Positive for  fever. Negative for chills.  HENT: Negative for sore throat.   Eyes: Negative for visual disturbance.  Respiratory: Negative for cough and shortness of breath.   Cardiovascular: Negative for chest pain.  Gastrointestinal: Positive for abdominal pain and nausea. Negative for anorexia, constipation, diarrhea, hematemesis, hematochezia, melena and vomiting.  Genitourinary: Negative for dysuria and hematuria.  Musculoskeletal: Negative for neck pain.  Skin: Negative for rash.  Neurological: Negative for headaches.    Physical Exam Updated Vital Signs BP 114/78 (BP Location: Right Arm)   Pulse (!) 109   Temp (!) 101.1 F (38.4 C) (Oral)   Resp (!) 23   Ht 5\' 10"  (1.778 m)   Wt 102.5 kg   SpO2 95%   BMI 32.43 kg/m   Physical Exam Vitals and nursing note reviewed.  Constitutional:      Appearance: He is well-developed.  HENT:     Head: Normocephalic and atraumatic.  Eyes:     Conjunctiva/sclera: Conjunctivae normal.  Cardiovascular:     Rate and Rhythm: Regular rhythm. Tachycardia present.     Pulses: Normal pulses.     Heart sounds: No murmur.  Pulmonary:     Effort: Pulmonary effort is normal. No respiratory distress.     Breath sounds: Normal breath sounds.  Abdominal:     Palpations: Abdomen is soft.     Tenderness: There is generalized abdominal tenderness. There is no guarding or rebound.     Hernia: No hernia is present.  Musculoskeletal:        General: No deformity or signs of injury. Normal range of motion.     Cervical back: Neck supple.     Right lower leg: No edema.     Left lower leg: No edema.  Skin:    General: Skin is warm and dry.     Capillary Refill: Capillary refill takes less than 2 seconds.  Neurological:     General: No focal deficit present.     Mental Status: He is alert.     ED Results / Procedures / Treatments   Labs (all labs ordered are listed, but only abnormal results are displayed) Labs Reviewed  COMPREHENSIVE METABOLIC PANEL -  Abnormal; Notable for the following components:      Result Value   Glucose, Bld 136 (*)    Calcium 8.7 (*)    All other components within normal limits  CBC WITH DIFFERENTIAL/PLATELET - Abnormal; Notable for the following components:   Platelets 144 (*)    All other components within normal limits  LACTIC ACID, PLASMA - Abnormal; Notable for the following components:   Lactic Acid, Venous 2.2 (*)    All other components within normal limits  URINALYSIS, ROUTINE W REFLEX MICROSCOPIC - Abnormal; Notable for the following components:   Specific Gravity, Urine  1.035 (*)    Hgb urine dipstick SMALL (*)    Bacteria, UA RARE (*)    All other components within normal limits  CBC - Abnormal; Notable for the following components:   RBC 3.87 (*)    Hemoglobin 11.4 (*)    HCT 35.2 (*)    Platelets 135 (*)    All other components within normal limits  RESPIRATORY PANEL BY RT PCR (FLU A&B, COVID)  CULTURE, BLOOD (ROUTINE X 2)  CULTURE, BLOOD (ROUTINE X 2)  URINE CULTURE  LIPASE, BLOOD  PROTIME-INR  LACTIC ACID, PLASMA  HIV ANTIBODY (ROUTINE TESTING W REFLEX)  CREATININE, SERUM  CBC  BASIC METABOLIC PANEL  PROTIME-INR    EKG EKG Interpretation  Date/Time:  Friday June 26 2019 07:06:01 EDT Ventricular Rate:  110 PR Interval:    QRS Duration: 86 QT Interval:  302 QTC Calculation: 409 R Axis:   -82 Text Interpretation: Sinus tachycardia Probable left atrial enlargement Left anterior fascicular block Abnormal R-wave progression, late transition No old tracing to compare Confirmed by Meridee Score [phone]) on 06/26/2019 7:07:27 AM   Radiology CT Abdomen Pelvis W Contrast  Result Date: 06/26/2019 CLINICAL DATA:  Abdominal abscess/infection suspected EXAM: CT ABDOMEN AND PELVIS WITH CONTRAST TECHNIQUE: Multidetector CT imaging of the abdomen and pelvis was performed using the standard protocol following bolus administration of intravenous contrast. CONTRAST:  OMNIPAQUE IOHEXOL  300 MG/ML  SOLN COMPARISON:  None. FINDINGS: Lower chest:  Coronary atherosclerosis.  Mild dependent atelectasis. Hepatobiliary: No focal liver abnormality.No evidence of biliary obstruction or stone. Pancreas: Unremarkable. Spleen: Unremarkable. Adrenals/Urinary Tract: Negative adrenals. No hydronephrosis or stone. Simple appearing bilateral renal cystic densities. Unremarkable bladder. Stomach/Bowel: Dilated appendix with wall thickening and regional fat inflammation. Outer wall diameter is 18 mm. There is perforation at the base with extraluminal gas bubbles seen laterally. No organized collection. The appendix extends inferiorly from the cecum in the right lower quadrant. Colonic diverticulosis, limited in extent and mainly seen at the cecum. Vascular/Lymphatic: Atherosclerotic calcification that is widespread. No mass or adenopathy. Reproductive:Enlarged prostate up lifting the bladder base. Other: No ascites or pneumoperitoneum. Musculoskeletal: No acute abnormalities.  Diffuse disc degeneration. These results were called by telephone at the time of interpretation on 06/26/2019 at 9:51 am to provider [facility] , who verbally acknowledged these results. IMPRESSION: 1. Perforated appendicitis. The extraluminal spillage is localized but not well organized near the base of the appendix. 2.  Aortic Atherosclerosis (ICD10-I70.0).  Coronary atherosclerosis. Electronically Signed   By: Marnee Spring M.D.   On: 06/26/2019 09:53   DG Chest Port 1 View  Result Date: 06/26/2019 CLINICAL DATA:  Fever and abdominal pain. EXAM: PORTABLE CHEST 1 VIEW COMPARISON:  None. FINDINGS: Midline trachea. Normal heart size for level of inspiration. Numerous leads and wires project over the chest. No pleural effusion or pneumothorax. Mild subsegmental atelectasis at both lung bases. IMPRESSION: No active disease. Electronically Signed   By: Jeronimo Greaves M.D.   On: 06/26/2019 08:22    Procedures .Critical Care Performed  by: Terrilee Files, MD Authorized by: Terrilee Files, MD   Critical care provider statement:    Critical care time (minutes):  45   Critical care time was exclusive of:  Separately billable procedures and treating other patients   Critical care was necessary to treat or prevent imminent or life-threatening deterioration of the following conditions:  Sepsis   Critical care was time spent personally by me on the following activities:  Discussions with  consultants, evaluation of patient's response to treatment, examination of patient, ordering and performing treatments and interventions, ordering and review of laboratory studies, ordering and review of radiographic studies, pulse oximetry, re-evaluation of patient's condition, obtaining history from patient or surrogate, review of old charts and development of treatment plan with patient or surrogate   I assumed direction of critical care for this patient from another provider in my specialty: no     (including critical care time)  Medications Ordered in ED Medications  sodium chloride (PF) 0.9 % injection (  Not Given 06/26/19 0944)  0.9 % NaCl with KCl 20 mEq/ L  infusion ( Intravenous New Bag/Given 06/26/19 1217)  morphine 2 MG/ML injection 2-3 mg (has no administration in time range)  acetaminophen (TYLENOL) tablet 650 mg (650 mg Oral Given 06/26/19 1355)    Or  acetaminophen (TYLENOL) suppository 650 mg ( Rectal See Alternative 06/26/19 1355)  diphenhydrAMINE (BENADRYL) 12.5 MG/5ML elixir 12.5 mg (has no administration in time range)    Or  diphenhydrAMINE (BENADRYL) injection 12.5 mg (has no administration in time range)  ondansetron (ZOFRAN-ODT) disintegrating tablet 4 mg (has no administration in time range)    Or  ondansetron (ZOFRAN) injection 4 mg (has no administration in time range)  carvedilol (COREG) tablet 25 mg (has no administration in time range)  doxazosin (CARDURA) tablet 8 mg (has no administration in time range)    gabapentin (NEURONTIN) capsule 300 mg (300 mg Oral Given 06/26/19 1543)  HYDROcodone-acetaminophen (NORCO/VICODIN) 5-325 MG per tablet 1-2 tablet (2 tablets Oral Given 06/26/19 1355)  lamoTRIgine (LAMICTAL) tablet 150 mg (150 mg Oral Given 06/26/19 1559)  spironolactone (ALDACTONE) tablet 25 mg (25 mg Oral Given 06/26/19 1543)  piperacillin-tazobactam (ZOSYN) IVPB 3.375 g (3.375 g Intravenous New Bag/Given 06/26/19 1225)  enoxaparin (LOVENOX) injection 40 mg (has no administration in time range)  sodium chloride 0.9 % bolus 1,000 mL (0 mLs Intravenous Stopped 06/26/19 1012)  ondansetron (ZOFRAN) injection 4 mg (4 mg Intravenous Given 06/26/19 0738)  morphine 4 MG/ML injection 4 mg (4 mg Intravenous Given 06/26/19 0738)  acetaminophen (TYLENOL) tablet 650 mg (650 mg Oral Given 06/26/19 0737)  cefTRIAXone (ROCEPHIN) 2 g in sodium chloride 0.9 % 100 mL IVPB (0 g Intravenous Stopped 06/26/19 0919)  metroNIDAZOLE (FLAGYL) IVPB 500 mg (0 mg Intravenous Stopped 06/26/19 1026)  sodium chloride 0.9 % bolus 1,000 mL (0 mLs Intravenous Stopped 06/26/19 0930)    And  sodium chloride 0.9 % bolus 1,000 mL (0 mLs Intravenous Stopped 06/26/19 1030)  iohexol (OMNIPAQUE) 300 MG/ML solution 100 mL (100 mLs Intravenous Contrast Given 06/26/19 0935)    ED Course  I have reviewed the triage vital signs and the nursing notes.  Pertinent labs & imaging results that were available during my care of the patient were reviewed by me and considered in my medical decision making (see chart for details).  Clinical Course as of Jun 25 1899  Fri Jun 26, 2019  0706 Differential includes sepsis, perforation, cholecystitis, bowel obstruction, colitis, diverticulitis, peptic ulcer disease   [MB]  0803 Upright chest x-ray showing no free air.  Interpreted by me.  Awaiting radiology reading.   [MB]  0830 Lactate elevated at 2.2.  Will activate code sepsis and initiate antibiotics.   [MB]  [id number] Received a call from radiologist that the  patient has perforated appendicitis.  No organized fluid collection.  Put patient in for Covid testing and general surgery consult.   [MB]  [id number] Updated patient on results.  He is agreeable for admission.  Pain controlled   [MB]  1013 Discussed with Mr Marlyne BeardsJennings from general surgery who will evaluate the patient.   [MB]    Clinical Course User Index [MB] Terrilee FilesButler, Chelsey Redondo C, MD   MDM Rules/Calculators/A&P                       Final Clinical Impression(s) / ED Diagnoses Final diagnoses:  Sepsis, due to unspecified organism, unspecified whether acute organ dysfunction present [facility](HCC)  Perforated appendicitis    Rx / DC Orders ED Discharge Orders    None       Terrilee FilesButler, Shloka Baldridge C, MD 06/26/19 1901

## 2019-06-26 NOTE — H&P (Signed)
[facility] Admission Note  Jeffrey Good Oct 26, 1950  [id number].    Requesting MD: Meridee Score Chief Complaint: abdominal pain Reason for Consult: perforated appendix   HPI: Patient is a 69 year old male presenting to the ED complaining of RLQ pain since yesterday around 5 PM..  It became worse overnight, and he presented to the ED early this AM.  His wife called EMS, and wouldn't let him put anything including teeth into his mouth.  He had his second Covid shot 06/23/2019, he had a bout of diarrhea, possible fever with that.  Work-up in the ED shows he is febrile with a temperature of 202.6.  Blood pressure is in the 98/63 -107/77 range.  A CMP is essentially normal with a glucose of 136.  Lactate is 2.2, WBC 6.4, hemoglobin 13.1, hematocrit 40.2, platelets 144,000.  Chest x-ray is essentially normal except for some subsegmental atelectasis in both lung bases.  CT scan shows a perforated appendicitis with extraluminal spillage is localized but not well organized near the base of the appendix.  We are asked to see.  ROS: Review of Systems  Constitutional: Positive for fever (No fever he is talking about his after his second Covid vaccination probably not related to today). Negative for chills, diaphoresis, malaise/fatigue and weight loss.  HENT: Negative.   Eyes: Negative.   Respiratory: Negative.   Cardiovascular: Negative.   Gastrointestinal: Positive for abdominal pain (Pain in the right lower quadrant.  He does not have peritonitis but he can feel it more when you palpate other quadrants of his abdomen.) and heartburn (Occasional depending on the food he ate). Negative for blood in stool, constipation, diarrhea, melena, nausea and vomiting.  Genitourinary: Negative.   Musculoskeletal: Positive for back pain.       He has dysesthesia primarily the left lower extremity  Skin: Negative.   Neurological: Negative.   Endo/Heme/Allergies: Negative.    Psychiatric/Behavioral: Negative.     Family History  Problem Relation Age of Onset  . Hypertension Mother   . Diabetes Mother     Past Medical History:  Diagnosis Date  . Erectile dysfunction   . Hyperlipidemia   . Hypertension Lumbosacral radiculopathy at S1/Dysethesia Midline low back pain -chronic     Past Surgical History:  Procedure Laterality Date  . KNEE ARTHROSCOPY     1989 left    Social History:  reports that he has never smoked. He has never used smokeless tobacco. He reports current alcohol use of about 1.0 standard drinks of alcohol per week. He reports that he does not use drugs.  Allergies:  Allergies  Allergen Reactions  . Pregabalin Nausea Only    Pt states he doesn't recall this allergy    Prior to Admission medications   Medication Sig Start Date End Date Taking? Authorizing Provider  carvedilol (COREG) 25 MG tablet Take 1 tablet by mouth twice daily Patient taking differently: Take 25 mg by mouth in the morning and at bedtime.  06/03/19  Yes Nahser, Deloris Ping, MD  Cetirizine HCl (ZYRTEC ALLERGY) 10 MG CAPS Take 10 mg by mouth daily.  11/15/08  Yes [provider]  doxazosin (CARDURA) 8 MG tablet Take 1 tablet (8 mg total) by mouth at bedtime. 06/03/19  Yes Nahser, Deloris Ping, MD  gabapentin (NEURONTIN) 300 MG capsule TAKE 1 CAPSULE BY MOUTH THREE TIMES A DAY Patient taking differently: Take 300 mg by mouth 3 (three) times daily.  12/22/18  Yes Lomax, Amy, NP  HYDROcodone-acetaminophen (NORCO/VICODIN) 5-325 MG  tablet Take one or two po daily as needed Patient taking differently: Take 1-2 tablets by mouth daily as needed for moderate pain.  06/18/19  Yes Sater, Nanine Means, MD  lamoTRIgine (LAMICTAL) 150 MG tablet TAKE 1 TABLET BY MOUTH TWICE A DAY Patient taking differently: Take 150 mg by mouth 2 (two) times daily.  04/28/19  Yes Lomax, Amy, NP  lisinopril (ZESTRIL) 20 MG tablet Take 1 tablet by mouth twice daily Patient taking differently: Take 20 mg  by mouth in the morning and at bedtime.  06/11/19  Yes Nahser, Wonda Cheng, MD  meloxicam (MOBIC) 7.5 MG tablet Take 1 tablet (7.5 mg total) by mouth daily. 11/26/18  Yes Lomax, Amy, NP  sildenafil (VIAGRA) 100 MG tablet TAKE ONE HALF TO ONE PILL AS NEEDED Patient taking differently: Take 50-100 mg by mouth as needed for erectile dysfunction.  12/01/18  Yes Lomax, Amy, NP  spironolactone (ALDACTONE) 25 MG tablet TAKE 1 TABLET BY MOUTH ONCE DAILY . APPOINTMENT REQUIRED FOR FUTURE REFILLS Patient taking differently: Take 25 mg by mouth daily.  07/21/18  Yes Nahser, Wonda Cheng, MD  potassium chloride (K-DUR,KLOR-CON) 10 MEQ tablet Take 1 tablet (10 mEq total) by mouth daily. 04/30/12 05/31/13  Nahser, Wonda Cheng, MD     Blood pressure 123/84, pulse (!) 109, temperature (!) 102.6 F (39.2 C), temperature source Oral, resp. rate (!) 23, height 5\' 10"  (1.778 m), weight 102.5 kg, SpO2 96 %. Physical Exam:  General: pleasant, WD, AA male on gurney, he is febrile, but no acute distress. HEENT: head is normocephalic, atraumatic.  Sclera are noninjected.  Lids are equal ears and nose without any masses or lesions.  Mouth is pink and moist Heart: regular, rate, and rhythm.  Normal s1,s2. No obvious murmurs, gallops, or rubs noted.  Palpable radial and pedal pulses bilaterally Lungs: CTAB, no wheezes, rhonchi, or rales noted.  Respiratory effort nonlabored Abd: soft, he is tender in the right lower quadrant.  Palpation other quadrants make pain right lower quadrant more severe.  No bowel sounds.  No masses, hernia or palpable organomegaly. MS: all 4 extremities are symmetrical with no cyanosis, clubbing, or edema. Skin: warm and dry with no masses, lesions, or rashes Neuro: Cranial nerves 2-12 grossly intact, sensation is normal throughout Psych: A&Ox3 with an appropriate affect.   Results for orders placed or performed during the hospital encounter of 06/26/19 (from the past 48 hour(s))  Comprehensive metabolic panel      Status: Abnormal   Collection Time: 06/26/19  7:14 AM  Result Value Ref Range   Sodium 135 135 - 145 mmol/L   Potassium 4.0 3.5 - 5.1 mmol/L   Chloride 102 98 - 111 mmol/L   CO2 23 22 - 32 mmol/L   Glucose, Bld 136 (H) 70 - 99 mg/dL    Comment: Glucose reference range applies only to samples taken after fasting for at least 8 hours.   BUN 17 8 - 23 mg/dL   Creatinine, Ser 1.03 0.61 - 1.24 mg/dL   Calcium 8.7 (L) 8.9 - 10.3 mg/dL   Total Protein 7.7 6.5 - 8.1 g/dL   Albumin 3.7 3.5 - 5.0 g/dL   AST 19 15 - 41 U/L   ALT 17 0 - 44 U/L   Alkaline Phosphatase 43 38 - 126 U/L   Total Bilirubin 0.5 0.3 - 1.2 mg/dL   GFR calc non Af Amer >60 >60 mL/min   GFR calc Af Amer >60 >60 mL/min   Anion gap  10 5 - 15    Comment: Performed at [facility], [address], [place], [place] [address]  Lipase, blood     Status: None   Collection Time: 06/26/19  7:14 AM  Result Value Ref Range   Lipase 31 11 - 51 U/L    Comment: Performed at [facility], [address] [place], [place] [address]  CBC with Differential     Status: Abnormal   Collection Time: 06/26/19  7:14 AM  Result Value Ref Range   WBC 6.4 4.0 - 10.5 K/uL   RBC 4.45 4.22 - 5.81 MIL/uL   Hemoglobin 13.1 13.0 - 17.0 g/dL   HCT 67.3 41.9 - 37.9 %   MCV 90.3 80.0 - 100.0 fL   MCH 29.4 26.0 - 34.0 pg   MCHC 32.6 30.0 - 36.0 g/dL   RDW 02.4 09.7 - 35.3 %   Platelets 144 (L) 150 - 400 K/uL   nRBC 0.0 0.0 - 0.2 %   Neutrophils Relative % 84 %   Neutro Abs 5.5 1.7 - 7.7 K/uL   Lymphocytes Relative 11 %   Lymphs Abs 0.7 0.7 - 4.0 K/uL   Monocytes Relative 4 %   Monocytes Absolute 0.3 0.1 - 1.0 K/uL   Eosinophils Relative 0 %   Eosinophils Absolute 0.0 0.0 - 0.5 K/uL   Basophils Relative 0 %   Basophils Absolute 0.0 0.0 - 0.1 K/uL   Immature Granulocytes 1 %   Abs Immature Granulocytes 0.03 0.00 - 0.07 K/uL    Comment: Performed at [facility], [address]  [place], [place] [address]  Protime-INR     Status: None   Collection Time: 06/26/19  7:14 AM  Result Value Ref Range   Prothrombin Time 13.1 11.4 - 15.2 seconds   INR 1.0 0.8 - 1.2    Comment: (NOTE) INR goal varies based on device and disease states. Performed at [facility], [address] [place], [place] [address]   Lactic acid, plasma     Status: Abnormal   Collection Time: 06/26/19  7:15 AM  Result Value Ref Range   Lactic Acid, Venous 2.2 (HH) 0.5 - 1.9 mmol/L    Comment: CRITICAL RESULT CALLED TO, READ BACK BY AND VERIFIED WITH: RAU,C. RN AT 1962 06/26/19 MULLINS,T Performed at [facility], [address], [place], [place] [address]    CT Abdomen Pelvis W Contrast  Result Date: 06/26/2019 CLINICAL DATA:  Abdominal abscess/infection suspected EXAM: CT ABDOMEN AND PELVIS WITH CONTRAST TECHNIQUE: Multidetector CT imaging of the abdomen and pelvis was performed using the standard protocol following bolus administration of intravenous contrast. CONTRAST:  OMNIPAQUE IOHEXOL 300 MG/ML  SOLN COMPARISON:  None. FINDINGS: Lower chest:  Coronary atherosclerosis.  Mild dependent atelectasis. Hepatobiliary: No focal liver abnormality.No evidence of biliary obstruction or stone. Pancreas: Unremarkable. Spleen: Unremarkable. Adrenals/Urinary Tract: Negative adrenals. No hydronephrosis or stone. Simple appearing bilateral renal cystic densities. Unremarkable bladder. Stomach/Bowel: Dilated appendix with wall thickening and regional fat inflammation. Outer wall diameter is 18 mm. There is perforation at the base with extraluminal gas bubbles seen laterally. No organized collection. The appendix extends inferiorly from the cecum in the right lower quadrant. Colonic diverticulosis, limited in extent and mainly seen at the cecum. Vascular/Lymphatic: Atherosclerotic calcification that is widespread. No mass or adenopathy. Reproductive:Enlarged prostate up lifting  the bladder base. Other: No ascites or pneumoperitoneum. Musculoskeletal: No acute abnormalities.  Diffuse disc degeneration. These results  were called by telephone at the time of interpretation on 06/26/2019 at 9:51 am to provider [facility] , who verbally acknowledged these results. IMPRESSION: 1. Perforated appendicitis. The extraluminal spillage is localized but not well organized near the base of the appendix. 2.  Aortic Atherosclerosis (ICD10-I70.0).  Coronary atherosclerosis. Electronically Signed   By: Marnee Spring M.D.   On: 06/26/2019 09:53   DG Chest Port 1 View  Result Date: 06/26/2019 CLINICAL DATA:  Fever and abdominal pain. EXAM: PORTABLE CHEST 1 VIEW COMPARISON:  None. FINDINGS: Midline trachea. Normal heart size for level of inspiration. Numerous leads and wires project over the chest. No pleural effusion or pneumothorax. Mild subsegmental atelectasis at both lung bases. IMPRESSION: No active disease. Electronically Signed   By: Jeronimo Greaves M.D.   On: 06/26/2019 08:22      Assessment/Plan Hypertension Hyperlipidemia Lumbosacral radiculopathy/ S1 dysesthesia Chronic mid back pain  Perforated appendix with sepsis  FEN: IV fluids/sips and chips ID: Rocephin/Flagyl x1 >> start Zosyn 3/19  DVT: Lovenox Follow-up: To be determined  Plan: Admit to inpatient.  Start him on IV antibiotics, IV hydration, continue his home medications with sips.   [facility] 06/26/2019, 10:18 AM Please see Amion for pager number during day hours 7:00am-4:30pm

## 2019-06-27 LAB — URINE CULTURE: Culture: NO GROWTH

## 2019-06-27 LAB — CBC
HCT: 37.5 % — ABNORMAL LOW (ref 39.0–52.0)
Hemoglobin: 12.3 g/dL — ABNORMAL LOW (ref 13.0–17.0)
MCH: 29.2 pg (ref 26.0–34.0)
MCHC: 32.8 g/dL (ref 30.0–36.0)
MCV: 89.1 fL (ref 80.0–100.0)
Platelets: 110 10*3/uL — ABNORMAL LOW (ref 150–400)
RBC: 4.21 MIL/uL — ABNORMAL LOW (ref 4.22–5.81)
RDW: 13.5 % (ref 11.5–15.5)
WBC: 8.9 10*3/uL (ref 4.0–10.5)
nRBC: 0 % (ref 0.0–0.2)

## 2019-06-27 LAB — BASIC METABOLIC PANEL
Anion gap: 8 (ref 5–15)
BUN: 17 mg/dL (ref 8–23)
CO2: 23 mmol/L (ref 22–32)
Calcium: 7.8 mg/dL — ABNORMAL LOW (ref 8.9–10.3)
Chloride: 105 mmol/L (ref 98–111)
Creatinine, Ser: 1.27 mg/dL — ABNORMAL HIGH (ref 0.61–1.24)
GFR calc Af Amer: >60 mL/min (ref >60)
GFR calc non Af Amer: 58 mL/min — ABNORMAL LOW (ref >60)
Glucose, Bld: 92 mg/dL (ref 70–99)
Potassium: 4.1 mmol/L (ref 3.5–5.1)
Sodium: 136 mmol/L (ref 135–145)

## 2019-06-27 LAB — PROTIME-INR
INR: 1.4 — ABNORMAL HIGH (ref 0.8–1.2)
Prothrombin Time: 16.7 seconds — ABNORMAL HIGH (ref 11.4–15.2)

## 2019-06-27 MED ORDER — HYDROMORPHONE HCL 1 MG/ML IJ SOLN
1.0000 mg | INTRAMUSCULAR | Status: DC | PRN
  Administered 2019-06-27 – 2019-07-02 (×6): 1 mg via INTRAVENOUS
  Filled 2019-06-27 (×8): qty 1

## 2019-06-27 NOTE — Progress Notes (Signed)
Patient abdomen seems more distended and firmer than this AM. Patient with pain of seven. Dr Corliss Skains notified and orders noted. Lina Sar, Charity fundraiser.

## 2019-06-27 NOTE — Progress Notes (Signed)
Perforated appendix  Subjective: Pain is better.  Still having fevers  Objective: Vital signs in last 24 hours: Temp:  [98.4 F (36.9 C)-102.6 F (39.2 C)] 101 F (38.3 C) (03/20 0645) Pulse Rate:  [92-115] 97 (03/20 0418) Resp:  [16-29] 19 (03/20 0418) BP: (85-123)/(57-95) 111/95 (03/20 0418) SpO2:  [92 %-100 %] 96 % (03/20 0418) Last BM Date: 06/25/19  Intake/Output from previous day: 03/19 0701 - 03/20 0700 In: 2325.5 [P.O.:480; I.V.:1730.8; IV Piggyback:114.7] Out: 700 [Urine:700] Intake/Output this shift: Total I/O In: -  Out: 200 [Urine:200]  General appearance: alert and cooperative GI: soft, TTP RLQ  Lab Results:  Results for orders placed or performed during the hospital encounter of 06/26/19 (from the past 24 hour(s))  Respiratory Panel by RT PCR (Flu A&B, Covid) - Nasopharyngeal Swab     Status: None   Collection Time: 06/26/19  9:54 AM   Specimen: Nasopharyngeal Swab  Result Value Ref Range   SARS Coronavirus 2 by RT PCR NEGATIVE NEGATIVE   Influenza A by PCR NEGATIVE NEGATIVE   Influenza B by PCR NEGATIVE NEGATIVE  Lactic acid, plasma     Status: None   Collection Time: 06/26/19 12:10 PM  Result Value Ref Range   Lactic Acid, Venous 1.7 0.5 - 1.9 mmol/L  HIV Antibody (routine testing w rflx)     Status: None   Collection Time: 06/26/19 12:10 PM  Result Value Ref Range   HIV Screen 4th Generation wRfx NON REACTIVE NON REACTIVE  CBC     Status: Abnormal   Collection Time: 06/26/19 12:10 PM  Result Value Ref Range   WBC 7.1 4.0 - 10.5 K/uL   RBC 3.87 (L) 4.22 - 5.81 MIL/uL   Hemoglobin 11.4 (L) 13.0 - 17.0 g/dL   HCT 35.2 (L) 39.0 - 52.0 %   MCV 91.0 80.0 - 100.0 fL   MCH 29.5 26.0 - 34.0 pg   MCHC 32.4 30.0 - 36.0 g/dL   RDW 13.3 11.5 - 15.5 %   Platelets 135 (L) 150 - 400 K/uL   nRBC 0.0 0.0 - 0.2 %  Creatinine, serum     Status: None   Collection Time: 06/26/19 12:10 PM  Result Value Ref Range   Creatinine, Ser 1.14 0.61 - 1.24 mg/dL   GFR  calc non Af Amer >60 >60 mL/min   GFR calc Af Amer >60 >60 mL/min  CBC     Status: Abnormal   Collection Time: 06/27/19  4:11 AM  Result Value Ref Range   WBC 8.9 4.0 - 10.5 K/uL   RBC 4.21 (L) 4.22 - 5.81 MIL/uL   Hemoglobin 12.3 (L) 13.0 - 17.0 g/dL   HCT 37.5 (L) 39.0 - 52.0 %   MCV 89.1 80.0 - 100.0 fL   MCH 29.2 26.0 - 34.0 pg   MCHC 32.8 30.0 - 36.0 g/dL   RDW 13.5 11.5 - 15.5 %   Platelets 110 (L) 150 - 400 K/uL   nRBC 0.0 0.0 - 0.2 %  Basic metabolic panel     Status: Abnormal   Collection Time: 06/27/19  4:11 AM  Result Value Ref Range   Sodium 136 135 - 145 mmol/L   Potassium 4.1 3.5 - 5.1 mmol/L   Chloride 105 98 - 111 mmol/L   CO2 23 22 - 32 mmol/L   Glucose, Bld 92 70 - 99 mg/dL   BUN 17 8 - 23 mg/dL   Creatinine, Ser 1.27 (H) 0.61 - 1.24 mg/dL   Calcium  7.8 (L) 8.9 - 10.3 mg/dL   GFR calc non Af Amer 58 (L) >60 mL/min   GFR calc Af Amer >60 >60 mL/min   Anion gap 8 5 - 15  Protime-INR     Status: Abnormal   Collection Time: 06/27/19  4:11 AM  Result Value Ref Range   Prothrombin Time 16.7 (H) 11.4 - 15.2 seconds   INR 1.4 (H) 0.8 - 1.2     Studies/Results Radiology     MEDS, Scheduled . carvedilol  25 mg Oral BID  . doxazosin  8 mg Oral QHS  . enoxaparin (LOVENOX) injection  40 mg Subcutaneous Q24H  . gabapentin  300 mg Oral TID  . lamoTRIgine  150 mg Oral BID  . spironolactone  25 mg Oral Daily     Assessment: Perforated appendix   Plan: Cont IV Zosyn Cont clears Increase IVF's due to increased Cr Recheck labs in AM  LOS: 1 day    Vanita Panda, MD [facility], [place]    06/27/2019 8:20 AM

## 2019-06-28 LAB — CBC
HCT: 36.7 % — ABNORMAL LOW (ref 39.0–52.0)
Hemoglobin: 12 g/dL — ABNORMAL LOW (ref 13.0–17.0)
MCH: 29.4 pg (ref 26.0–34.0)
MCHC: 32.7 g/dL (ref 30.0–36.0)
MCV: 90 fL (ref 80.0–100.0)
Platelets: 133 10*3/uL — ABNORMAL LOW (ref 150–400)
RBC: 4.08 MIL/uL — ABNORMAL LOW (ref 4.22–5.81)
RDW: 13.6 % (ref 11.5–15.5)
WBC: 9.6 10*3/uL (ref 4.0–10.5)
nRBC: 0 % (ref 0.0–0.2)

## 2019-06-28 LAB — BASIC METABOLIC PANEL
Anion gap: 9 (ref 5–15)
BUN: 16 mg/dL (ref 8–23)
CO2: 22 mmol/L (ref 22–32)
Calcium: 8.6 mg/dL — ABNORMAL LOW (ref 8.9–10.3)
Chloride: 104 mmol/L (ref 98–111)
Creatinine, Ser: 0.96 mg/dL (ref 0.61–1.24)
GFR calc Af Amer: >60 mL/min (ref >60)
GFR calc non Af Amer: >60 mL/min (ref >60)
Glucose, Bld: 108 mg/dL — ABNORMAL HIGH (ref 70–99)
Potassium: 3.7 mmol/L (ref 3.5–5.1)
Sodium: 135 mmol/L (ref 135–145)

## 2019-06-28 NOTE — Progress Notes (Signed)
Perforated appendix  Subjective: Pain is worse today.  Fever curve is down.  C/o reflux  Objective: Vital signs in last 24 hours: Temp:  [99.2 F (37.3 C)-100.4 F (38 C)] 99.8 F (37.7 C) (03/21 0702) Pulse Rate:  [89-101] 92 (03/21 0702) Resp:  [16-20] 20 (03/21 0702) BP: (114-139)/(72-95) 139/95 (03/21 0702) SpO2:  [96 %-98 %] 97 % (03/21 0702) Last BM Date: 06/25/19  Intake/Output from previous day: 03/20 0701 - 03/21 0700 In: 2510.2 [P.O.:400; I.V.:2024.8; IV Piggyback:85.4] Out: 1850 [Urine:1850] Intake/Output this shift: No intake/output data recorded.  General appearance: alert and cooperative GI: soft, TTP RLQ  Lab Results:  Results for orders placed or performed during the hospital encounter of 06/26/19 (from the past 24 hour(s))  CBC     Status: Abnormal   Collection Time: 06/28/19  3:58 AM  Result Value Ref Range   WBC 9.6 4.0 - 10.5 K/uL   RBC 4.08 (L) 4.22 - 5.81 MIL/uL   Hemoglobin 12.0 (L) 13.0 - 17.0 g/dL   HCT 29.5 (L) 28.4 - 13.2 %   MCV 90.0 80.0 - 100.0 fL   MCH 29.4 26.0 - 34.0 pg   MCHC 32.7 30.0 - 36.0 g/dL   RDW 44.0 10.2 - 72.5 %   Platelets 133 (L) 150 - 400 K/uL   nRBC 0.0 0.0 - 0.2 %  Basic metabolic panel     Status: Abnormal   Collection Time: 06/28/19  3:58 AM  Result Value Ref Range   Sodium 135 135 - 145 mmol/L   Potassium 3.7 3.5 - 5.1 mmol/L   Chloride 104 98 - 111 mmol/L   CO2 22 22 - 32 mmol/L   Glucose, Bld 108 (H) 70 - 99 mg/dL   BUN 16 8 - 23 mg/dL   Creatinine, Ser 3.66 0.61 - 1.24 mg/dL   Calcium 8.6 (L) 8.9 - 10.3 mg/dL   GFR calc non Af Amer >60 >60 mL/min   GFR calc Af Amer >60 >60 mL/min   Anion gap 9 5 - 15     Studies/Results Radiology     MEDS, Scheduled . carvedilol  25 mg Oral BID  . doxazosin  8 mg Oral QHS  . enoxaparin (LOVENOX) injection  40 mg Subcutaneous Q24H  . gabapentin  300 mg Oral TID  . lamoTRIgine  150 mg Oral BID  . spironolactone  25 mg Oral Daily     Assessment: Perforated  appendix   Plan: Cont IV Zosyn Cont NPO Cont IVF's, Cr better Recheck labs in AM  LOS: 2 days    Vanita Panda, MD [facility], [place]    06/28/2019 8:35 AM

## 2019-06-29 ENCOUNTER — Inpatient Hospital Stay (HOSPITAL_COMMUNITY): Payer: PRIVATE HEALTH INSURANCE

## 2019-06-29 LAB — CBC
HCT: 35.1 % — ABNORMAL LOW (ref 39.0–52.0)
Hemoglobin: 11.1 g/dL — ABNORMAL LOW (ref 13.0–17.0)
MCH: 28.5 pg (ref 26.0–34.0)
MCHC: 31.6 g/dL (ref 30.0–36.0)
MCV: 90 fL (ref 80.0–100.0)
Platelets: 158 10*3/uL (ref 150–400)
RBC: 3.9 MIL/uL — ABNORMAL LOW (ref 4.22–5.81)
RDW: 13.7 % (ref 11.5–15.5)
WBC: 10.2 10*3/uL (ref 4.0–10.5)
nRBC: 0 % (ref 0.0–0.2)

## 2019-06-29 MED ORDER — PHENOL 1.4 % MT LIQD
1.0000 | OROMUCOSAL | Status: DC | PRN
  Filled 2019-06-29 (×2): qty 177

## 2019-06-29 MED ORDER — LIP MEDEX EX OINT
TOPICAL_OINTMENT | CUTANEOUS | Status: AC
  Filled 2019-06-29: qty 7

## 2019-06-29 NOTE — Progress Notes (Signed)
[facility] Progress Note     Subjective: Patient denies abdominal pain but is still distended. He ambulated in the halls a lot yesterday and was passing more gas last night. No flatus this AM. Nausea and bilious emesis overnight. Hiccoughs this AM.   Review of Systems  Constitutional: Negative for chills and fever.  Respiratory: Negative for shortness of breath.   Cardiovascular: Negative for chest pain.  Gastrointestinal: Positive for constipation, nausea and vomiting. Negative for abdominal pain and diarrhea.  Genitourinary: Negative for dysuria, frequency and urgency.     Objective: Vital signs in last 24 hours: Temp:  [98.2 F (36.8 C)-98.9 F (37.2 C)] 98.6 F (37 C) (03/22 0556) Pulse Rate:  [84-92] 90 (03/22 0556) Resp:  [16-20] 20 (03/22 0556) BP: (134-140)/(85-93) 135/93 (03/22 0556) SpO2:  [91 %-99 %] 96 % (03/22 0556) Last BM Date: 06/25/19  Intake/Output from previous day: 03/21 0701 - 03/22 0700 In: 2994.3 [P.O.:120; I.V.:2800; IV Piggyback:74.3] Out: 404 [WUJWJ:191] Intake/Output this shift: Total I/O In: -  Out: 200 [Urine:200]  PE: General: pleasant, WD, WN male who is laying in bed in NAD HEENT: head is normocephalic, atraumatic.  Sclera are noninjected.  PERRL.  Ears and nose without any masses or lesions.  Mouth is pink and moist Heart: regular, rate, and rhythm.  Normal s1,s2. No obvious murmurs, gallops, or rubs noted.  Palpable radial and pedal pulses bilaterally Lungs: CTAB, no wheezes, rhonchi, or rales noted.  Respiratory effort nonlabored Abd: soft, NT and no guarding or peritonitis, distended, BS hypoactive, no masses, hernias, or organomegaly MS: all 4 extremities are symmetrical with no cyanosis, clubbing, or edema. Skin: warm and dry with no masses, lesions, or rashes Neuro: Cranial nerves 2-12 grossly intact, sensation grossly intact throughout Psych: A&Ox3 with an appropriate affect.   Lab Results:  Recent Labs   06/28/19 0358 06/29/19 0420  WBC 9.6 10.2  HGB 12.0* 11.1*  HCT 36.7* 35.1*  PLT 133* 158   BMET Recent Labs    06/27/19 0411 06/28/19 0358  NA 136 135  K 4.1 3.7  CL 105 104  CO2 23 22  GLUCOSE 92 108*  BUN 17 16  CREATININE 1.27* 0.96  CALCIUM 7.8* 8.6*   PT/INR Recent Labs    06/27/19 0411  LABPROT 16.7*  INR 1.4*   CMP     Component Value Date/Time   NA 135 06/28/2019 0358   K 3.7 06/28/2019 0358   CL 104 06/28/2019 0358   CO2 22 06/28/2019 0358   GLUCOSE 108 (H) 06/28/2019 0358   BUN 16 06/28/2019 0358   CREATININE 0.96 06/28/2019 0358   CREATININE 1.16 04/05/2016 1408   CALCIUM 8.6 (L) 06/28/2019 0358   PROT 7.7 06/26/2019 0714   PROT 7.3 11/16/2014 1635   ALBUMIN 3.7 06/26/2019 0714   AST 19 06/26/2019 0714   ALT 17 06/26/2019 0714   ALKPHOS 43 06/26/2019 0714   BILITOT 0.5 06/26/2019 0714   GFRNONAA >60 06/28/2019 0358   GFRAA >60 06/28/2019 0358   Lipase     Component Value Date/Time   LIPASE 31 06/26/2019 0714       Studies/Results: No results found.  Anti-infectives: Anti-infectives (From admission, onward)   Start     Dose/Rate Route Frequency Ordered Stop   06/26/19 1300  piperacillin-tazobactam (ZOSYN) IVPB 3.375 g     3.375 g 12.5 mL/hr over 240 Minutes Intravenous Every 8 hours 06/26/19 1155     06/26/19 0845  cefTRIAXone (ROCEPHIN) 2 g in  sodium chloride 0.9 % 100 mL IVPB     2 g 200 mL/hr over 30 Minutes Intravenous  Once 06/26/19 0832 06/26/19 0919   06/26/19 0845  metroNIDAZOLE (FLAGYL) IVPB 500 mg     500 mg 100 mL/hr over 60 Minutes Intravenous  Once 06/26/19 1103 06/26/19 1026       Assessment/Plan HTN HLD  Perforated appendicitis with ileus - CT 3/19 with perforated appendicitis but no organized fluid collection or abscess - WBC 10.2, afebrile - pt distended on exam but not peritonitic, not having much bowel function - check abdominal film - may need NGT placed - continue IV abx and mobilizing  FEN: ice  chips only, IVF VTE: SCDs, lovenox ID: rocephin/flagyl 3/19; Zosyn 3/19>>   LOS: 3 days    [name] , [facility] 06/29/2019, 8:53 AM Please see Amion for pager number during day hours 7:00am-4:30pm

## 2019-06-29 NOTE — Progress Notes (Signed)
Pharmacy Antibiotic Note  Jeffrey Good is a 69 y.o. 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  Pharmacy has been consulted for Zosyn dosing.  Plan: Will continue Zosyn 3.375g IV q8h (4 hour infusion).   Height: 5\' 10"  (177.8 cm) Weight: 226 lb (102.5 kg) IBW/kg (Calculated) : 73  Temp (24hrs), Avg:98.5 F (36.9 C), Min:97.7 F (36.5 C), Max:98.9 F (37.2 C)  Recent Labs  Lab 06/26/19 0714 06/26/19 0715 06/26/19 1210 06/27/19 0411 06/28/19 0358 06/29/19 0420  WBC 6.4  --  7.1 8.9 9.6 10.2  CREATININE 1.03  --  1.14 1.27* 0.96  --   LATICACIDVEN  --  2.2* 1.7  --   --   --     Estimated Creatinine Clearance: 90.8 mL/min (by C-G formula based on SCr of 0.96 mg/dL).    Allergies  Allergen Reactions  . Pregabalin Nausea Only    Pt states he doesn't recall this allergy   Antimicrobials this admission: 3/19 Rocephin/Flagyl x1 3/19 Zosyn >>   Dose adjustments this admission:  Microbiology results: 3/19 BCx: NGTD 3/19 UCx: NGF 3/19 Flu PCR: neg/neg 3/19 Covid: neg  Thank you for allowing pharmacy to be a part of this patient's care.  4/19 D 06/29/2019 2:03 PM

## 2019-06-30 ENCOUNTER — Encounter (HOSPITAL_COMMUNITY): Payer: Self-pay

## 2019-06-30 ENCOUNTER — Inpatient Hospital Stay (HOSPITAL_COMMUNITY): Payer: PRIVATE HEALTH INSURANCE

## 2019-06-30 LAB — BASIC METABOLIC PANEL
Anion gap: 11 (ref 5–15)
BUN: 19 mg/dL (ref 8–23)
CO2: 25 mmol/L (ref 22–32)
Calcium: 8.6 mg/dL — ABNORMAL LOW (ref 8.9–10.3)
Chloride: 104 mmol/L (ref 98–111)
Creatinine, Ser: 0.88 mg/dL (ref 0.61–1.24)
GFR calc Af Amer: >60 mL/min (ref >60)
GFR calc non Af Amer: >60 mL/min (ref >60)
Glucose, Bld: 103 mg/dL — ABNORMAL HIGH (ref 70–99)
Potassium: 3.5 mmol/L (ref 3.5–5.1)
Sodium: 140 mmol/L (ref 135–145)

## 2019-06-30 LAB — CBC
HCT: 32.8 % — ABNORMAL LOW (ref 39.0–52.0)
Hemoglobin: 10.6 g/dL — ABNORMAL LOW (ref 13.0–17.0)
MCH: 28.7 pg (ref 26.0–34.0)
MCHC: 32.3 g/dL (ref 30.0–36.0)
MCV: 88.9 fL (ref 80.0–100.0)
Platelets: 189 10*3/uL (ref 150–400)
RBC: 3.69 MIL/uL — ABNORMAL LOW (ref 4.22–5.81)
RDW: 13.7 % (ref 11.5–15.5)
WBC: 8.1 10*3/uL (ref 4.0–10.5)
nRBC: 0 % (ref 0.0–0.2)

## 2019-06-30 MED ORDER — IOHEXOL 300 MG/ML  SOLN
100.0000 mL | Freq: Once | INTRAMUSCULAR | Status: AC | PRN
  Administered 2019-06-30: 100 mL via INTRAVENOUS

## 2019-06-30 MED ORDER — GABAPENTIN 250 MG/5ML PO SOLN
300.0000 mg | Freq: Three times a day (TID) | ORAL | Status: DC
  Administered 2019-06-30 – 2019-07-02 (×6): 300 mg via ORAL
  Filled 2019-06-30 (×8): qty 6

## 2019-06-30 MED ORDER — ENOXAPARIN SODIUM 40 MG/0.4ML ~~LOC~~ SOLN: 40.0000 mg | SUBCUTANEOUS | Status: DC

## 2019-06-30 MED ORDER — IOHEXOL 9 MG/ML PO SOLN
ORAL | Status: AC
  Administered 2019-06-30: 500 mL
  Filled 2019-06-30: qty 1000

## 2019-06-30 MED ORDER — SODIUM CHLORIDE (PF) 0.9 % IJ SOLN
INTRAMUSCULAR | Status: AC
  Filled 2019-06-30: qty 50

## 2019-06-30 MED ORDER — IOHEXOL 9 MG/ML PO SOLN: 500.0000 mL | ORAL | Status: AC

## 2019-06-30 MED ORDER — SODIUM CHLORIDE 0.9 % IV SOLN
12.5000 mg | Freq: Three times a day (TID) | INTRAVENOUS | Status: DC | PRN
  Administered 2019-06-30: 12.5 mg via INTRAVENOUS
  Filled 2019-06-30 (×2): qty 0.5

## 2019-06-30 NOTE — Progress Notes (Signed)
[facility] Progress Note     Subjective: Patient denies abdominal pain. Some discomfort from NGT in throat. Passing some flatus but no BM.   Review of Systems  Constitutional: Negative for chills and fever.  HENT: Positive for sore throat.   Respiratory: Negative for shortness of breath.   Cardiovascular: Negative for chest pain.  Gastrointestinal: Positive for constipation. Negative for abdominal pain, nausea and vomiting.  Genitourinary: Negative for dysuria, frequency and urgency.     Objective: Vital signs in last 24 hours: Temp:  [97.7 F (36.5 C)-98.5 F (36.9 C)] 98.5 F (36.9 C) (03/23 0545) Pulse Rate:  [76-84] 78 (03/23 0545) Resp:  [18-19] 18 (03/23 0545) BP: (126-149)/(80-94) 149/90 (03/23 0545) SpO2:  [97 %-99 %] 97 % (03/23 0545) Last BM Date: 06/25/19  Intake/Output from previous day: 03/22 0701 - 03/23 0700 In: 1773.3 [P.O.:60; I.V.:1572.5; IV Piggyback:140.8] Out: 4750 [Urine:1300; Emesis/NG output:3450] Intake/Output this shift: Total I/O In: 0  Out: 1000 [Urine:150; Emesis/NG output:850]  PE: General: pleasant, WD, WN male who is laying in bed in NAD HEENT: head is normocephalic, atraumatic.  Sclera are noninjected.  PERRL.  Ears and nose without any masses or lesions.  Mouth is pink and moist Heart: regular, rate, and rhythm.  Normal s1,s2. No obvious murmurs, gallops, or rubs noted.  Palpable radial and pedal pulses bilaterally Lungs: CTAB, no wheezes, rhonchi, or rales noted.  Respiratory effort nonlabored Abd: soft, NT and no guarding or peritonitis, distended, BS hypoactive, no masses, hernias, or organomegaly MS: all 4 extremities are symmetrical with no cyanosis, clubbing, or edema. Skin: warm and dry with no masses, lesions, or rashes Neuro: Cranial nerves 2-12 grossly intact, sensation grossly intact throughout Psych: A&Ox3 with an appropriate affect.   Lab Results:  Recent Labs    06/29/19 0420 06/30/19 0431  WBC 10.2  8.1  HGB 11.1* 10.6*  HCT 35.1* 32.8*  PLT 158 189   BMET Recent Labs    06/28/19 0358 06/30/19 0431  NA 135 140  K 3.7 3.5  CL 104 104  CO2 22 25  GLUCOSE 108* 103*  BUN 16 19  CREATININE 0.96 0.88  CALCIUM 8.6* 8.6*   PT/INR No results for input(s): LABPROT, INR in the last 72 hours. CMP     Component Value Date/Time   NA 140 06/30/2019 0431   K 3.5 06/30/2019 0431   CL 104 06/30/2019 0431   CO2 25 06/30/2019 0431   GLUCOSE 103 (H) 06/30/2019 0431   BUN 19 06/30/2019 0431   CREATININE 0.88 06/30/2019 0431   CREATININE 1.16 04/05/2016 1408   CALCIUM 8.6 (L) 06/30/2019 0431   PROT 7.7 06/26/2019 0714   PROT 7.3 11/16/2014 1635   ALBUMIN 3.7 06/26/2019 0714   AST 19 06/26/2019 0714   ALT 17 06/26/2019 0714   ALKPHOS 43 06/26/2019 0714   BILITOT 0.5 06/26/2019 0714   GFRNONAA >60 06/30/2019 0431   GFRAA >60 06/30/2019 0431   Lipase     Component Value Date/Time   LIPASE 31 06/26/2019 0714       Studies/Results: DG Abd Portable 1V  Result Date: 06/29/2019 CLINICAL DATA:  NG tube placement. EXAM: PORTABLE ABDOMEN - 1 VIEW 10:16 a.m. COMPARISON:  06/29/2019 at 8:55 a.m. FINDINGS: NG tube tip is in the midbody of the stomach. Multiple dilated loops of small bowel in the mid abdomen consistent with ileus or small-bowel obstruction. Minimal linear atelectasis at the lung bases. IMPRESSION: NG tube tip in the midbody of the stomach.  Electronically Signed   By: Lorriane Shire M.D.   On: 06/29/2019 10:30   DG Abd Portable 1V  Result Date: 06/29/2019 CLINICAL DATA:  Ileus EXAM: PORTABLE ABDOMEN - 1 VIEW COMPARISON:  CT abdomen and pelvis June 26, 2019 FINDINGS: There remain loops of dilated bowel, primarily in the left abdomen. No appreciable air-fluid levels. Air is noted in the rectum. No free air. There are vascular calcifications in the pelvis. IMPRESSION: There remain loops of dilated bowel which may be indicative of ileus or enteritis. A degree of partial bowel  obstruction cannot be excluded. No evident free air. Vascular calcification in pelvis. Electronically Signed   By: Lowella Grip III M.D.   On: 06/29/2019 09:09    Anti-infectives: Anti-infectives (From admission, onward)   Start     Dose/Rate Route Frequency Ordered Stop   06/26/19 1300  piperacillin-tazobactam (ZOSYN) IVPB 3.375 g     3.375 g 12.5 mL/hr over 240 Minutes Intravenous Every 8 hours 06/26/19 1155     06/26/19 0845  cefTRIAXone (ROCEPHIN) 2 g in sodium chloride 0.9 % 100 mL IVPB     2 g 200 mL/hr over 30 Minutes Intravenous  Once 06/26/19 0832 06/26/19 0919   06/26/19 0845  metroNIDAZOLE (FLAGYL) IVPB 500 mg     500 mg 100 mL/hr over 60 Minutes Intravenous  Once 06/26/19 1962 06/26/19 1026       Assessment/Plan HTN HLD  Perforated appendicitis with ileus - CT 3/19 with perforated appendicitis but no organized fluid collection or abscess - WBC 8.1, afebrile - NGT placed yesterday with 3L out, patient is passing some flatus this AM but no BM - repeat CT today   FEN: NPO, NGT to LIWS, IVF VTE: SCDs, lovenox ID: rocephin/flagyl 3/19; Zosyn 3/19>>  LOS: 4 days    Brigid Re , [facility] 06/30/2019, 9:21 AM Please see Amion for pager number during day hours 7:00am-4:30pm

## 2019-07-01 ENCOUNTER — Inpatient Hospital Stay (HOSPITAL_COMMUNITY): Payer: PRIVATE HEALTH INSURANCE

## 2019-07-01 LAB — COMPREHENSIVE METABOLIC PANEL
ALT: 17 U/L (ref 0–44)
AST: 16 U/L (ref 15–41)
Albumin: 2.8 g/dL — ABNORMAL LOW (ref 3.5–5.0)
Alkaline Phosphatase: 40 U/L (ref 38–126)
Anion gap: 8 (ref 5–15)
BUN: 19 mg/dL (ref 8–23)
CO2: 26 mmol/L (ref 22–32)
Calcium: 8.4 mg/dL — ABNORMAL LOW (ref 8.9–10.3)
Chloride: 108 mmol/L (ref 98–111)
Creatinine, Ser: 0.89 mg/dL (ref 0.61–1.24)
GFR calc Af Amer: >60 mL/min (ref >60)
GFR calc non Af Amer: >60 mL/min (ref >60)
Glucose, Bld: 103 mg/dL — ABNORMAL HIGH (ref 70–99)
Potassium: 3.5 mmol/L (ref 3.5–5.1)
Sodium: 142 mmol/L (ref 135–145)
Total Bilirubin: 0.9 mg/dL (ref 0.3–1.2)
Total Protein: 6.5 g/dL (ref 6.5–8.1)

## 2019-07-01 LAB — CULTURE, BLOOD (ROUTINE X 2)
Culture: NO GROWTH
Special Requests: ADEQUATE

## 2019-07-01 LAB — CBC
HCT: 30.8 % — ABNORMAL LOW (ref 39.0–52.0)
Hemoglobin: 10.1 g/dL — ABNORMAL LOW (ref 13.0–17.0)
MCH: 29 pg (ref 26.0–34.0)
MCHC: 32.8 g/dL (ref 30.0–36.0)
MCV: 88.5 fL (ref 80.0–100.0)
Platelets: 201 10*3/uL (ref 150–400)
RBC: 3.48 MIL/uL — ABNORMAL LOW (ref 4.22–5.81)
RDW: 13.8 % (ref 11.5–15.5)
WBC: 8.7 10*3/uL (ref 4.0–10.5)
nRBC: 0 % (ref 0.0–0.2)

## 2019-07-01 LAB — PREALBUMIN: Prealbumin: 10.7 mg/dL — ABNORMAL LOW (ref 18–38)

## 2019-07-01 MED ORDER — FENTANYL CITRATE (PF) 100 MCG/2ML IJ SOLN
INTRAMUSCULAR | Status: AC
  Filled 2019-07-01: qty 4

## 2019-07-01 MED ORDER — NALOXONE HCL 0.4 MG/ML IJ SOLN
INTRAMUSCULAR | Status: AC
  Filled 2019-07-01: qty 1

## 2019-07-01 MED ORDER — MIDAZOLAM HCL 2 MG/2ML IJ SOLN
INTRAMUSCULAR | Status: AC
  Filled 2019-07-01: qty 4

## 2019-07-01 MED ORDER — LIDOCAINE HCL (PF) 1 % IJ SOLN
INTRAMUSCULAR | Status: AC | PRN
  Administered 2019-07-01: 10 mL via INTRADERMAL

## 2019-07-01 MED ORDER — FENTANYL CITRATE (PF) 100 MCG/2ML IJ SOLN
INTRAMUSCULAR | Status: AC | PRN
  Administered 2019-07-01 (×2): 50 ug via INTRAVENOUS

## 2019-07-01 MED ORDER — MIDAZOLAM HCL 2 MG/2ML IJ SOLN
INTRAMUSCULAR | Status: AC | PRN
  Administered 2019-07-01 (×2): 1 mg via INTRAVENOUS

## 2019-07-01 MED ORDER — FLUMAZENIL 0.5 MG/5ML IV SOLN
INTRAVENOUS | Status: AC
  Filled 2019-07-01: qty 5

## 2019-07-01 NOTE — Consult Note (Signed)
Chief Complaint: RLQ Pain  Referring Physician(s): K. Rayburn PA  Supervising Physician: Gilmer Mor  Patient Status: [facility] - In-pt  History of Present Illness: Jeffrey Good is a 69 y.o. male Presented to this facility with RLQ pain x 1 day found to have a perforated appendix now with ilieus Team is requesting aspiration- abscess drain to intra abdominal fluid collection.  Past Medical History:  Diagnosis Date  . Erectile dysfunction   . Hyperlipidemia   . Hypertension     Past Surgical History:  Procedure Laterality Date  . KNEE ARTHROSCOPY     1989 left    Allergies: Pregabalin  Medications: Prior to Admission medications   Medication Sig Start Date End Date Taking? Authorizing Provider  carvedilol (COREG) 25 MG tablet Take 1 tablet by mouth twice daily Patient taking differently: Take 25 mg by mouth in the morning and at bedtime.  06/03/19  Yes Nahser, Deloris Ping, MD  Cetirizine HCl (ZYRTEC ALLERGY) 10 MG CAPS Take 10 mg by mouth daily.  11/15/08  Yes [provider]  doxazosin (CARDURA) 8 MG tablet Take 1 tablet (8 mg total) by mouth at bedtime. 06/03/19  Yes Nahser, Deloris Ping, MD  gabapentin (NEURONTIN) 300 MG capsule TAKE 1 CAPSULE BY MOUTH THREE TIMES A DAY Patient taking differently: Take 300 mg by mouth 3 (three) times daily.  12/22/18  Yes Lomax, Amy, NP  HYDROcodone-acetaminophen (NORCO/VICODIN) 5-325 MG tablet Take one or two po daily as needed Patient taking differently: Take 1-2 tablets by mouth daily as needed for moderate pain.  06/18/19  Yes Sater, Pearletha Furl, MD  lamoTRIgine (LAMICTAL) 150 MG tablet TAKE 1 TABLET BY MOUTH TWICE A DAY Patient taking differently: Take 150 mg by mouth 2 (two) times daily.  04/28/19  Yes Lomax, Amy, NP  lisinopril (ZESTRIL) 20 MG tablet Take 1 tablet by mouth twice daily Patient taking differently: Take 20 mg by mouth in the morning and at bedtime.  06/11/19  Yes Nahser, Deloris Ping, MD  meloxicam (MOBIC) 7.5 MG tablet  Take 1 tablet (7.5 mg total) by mouth daily. 11/26/18  Yes Lomax, Amy, NP  sildenafil (VIAGRA) 100 MG tablet TAKE ONE HALF TO ONE PILL AS NEEDED Patient taking differently: Take 50-100 mg by mouth as needed for erectile dysfunction.  12/01/18  Yes Lomax, Amy, NP  spironolactone (ALDACTONE) 25 MG tablet TAKE 1 TABLET BY MOUTH ONCE DAILY . APPOINTMENT REQUIRED FOR FUTURE REFILLS Patient taking differently: Take 25 mg by mouth daily.  07/21/18  Yes Nahser, Deloris Ping, MD  potassium chloride (K-DUR,KLOR-CON) 10 MEQ tablet Take 1 tablet (10 mEq total) by mouth daily. 04/30/12 05/31/13  Nahser, Deloris Ping, MD     Family History  Problem Relation Age of Onset  . Hypertension Mother   . Diabetes Mother     Social History   Socioeconomic History  . Marital status: Married    Spouse name: Not on file  . Number of children: Not on file  . Years of education: Not on file  . Highest education level: Not on file  Occupational History  . Not on file  Tobacco Use  . Smoking status: Never Smoker  . Smokeless tobacco: Never Used  Substance and Sexual Activity  . Alcohol use: Yes    Alcohol/week: 1.0 standard drinks    Types: 1 Cans of beer per week    Comment: very rare  . Drug use: No  . Sexual activity: Yes  Other Topics Concern  . Not  on file  Social History Narrative  . Not on file   Social Determinants of Health   Financial Resource Strain:   . Difficulty of Paying Living Expenses:   Food Insecurity:   . Worried About Programme researcher, broadcasting/film/videounning Out of Food in the Last Year:   . Baristaan Out of Food in the Last Year:   Transportation Needs:   . Freight forwarderLack of Transportation (Medical):   Marland Kitchen. Lack of Transportation (Non-Medical):   Physical Activity:   . Days of Exercise per Week:   . Minutes of Exercise per Session:   Stress:   . Feeling of Stress :   Social Connections:   . Frequency of Communication with Friends and Family:   . Frequency of Social Gatherings with Friends and Family:   . Attends Religious Services:    . Active Member of Clubs or Organizations:   . Attends BankerClub or Organization Meetings:   Marland Kitchen. Marital Status:    Review of Systems: A 12 point ROS discussed and pertinent positives are indicated in the HPI above.  All other systems are negative.  Review of Systems  Constitutional: Negative for fever.  HENT: Positive for sore throat ( related to NG tube). Negative for congestion.   Respiratory: Negative for cough and shortness of breath.   Cardiovascular: Negative for chest pain.  Gastrointestinal: Negative for abdominal pain.  Neurological: Negative for headaches.  Psychiatric/Behavioral: Negative for behavioral problems and confusion.    Vital Signs: BP (!) 142/79 (BP Location: Left Arm)   Pulse 76   Temp 99.2 F (37.3 C) (Oral)   Resp 18   Ht 5\' 10"  (1.778 m)   Wt 226 lb (102.5 kg)   SpO2 93%   BMI 32.43 kg/m   Physical Exam Vitals and nursing note reviewed.  Constitutional:      Appearance: He is well-developed.  HENT:     Head: Normocephalic.     Comments: NG tube present Cardiovascular:     Rate and Rhythm: Normal rate and regular rhythm.     Heart sounds: Normal heart sounds.  Pulmonary:     Effort: Pulmonary effort is normal.     Breath sounds: Normal breath sounds.  Musculoskeletal:        General: Normal range of motion.     Cervical back: Normal range of motion.  Skin:    General: Skin is dry.  Neurological:     Mental Status: He is alert and oriented to person, place, and time.     Imaging: CT ABDOMEN PELVIS W CONTRAST  Result Date: 06/30/2019 CLINICAL DATA:  Perforated appendicitis, persistent ileus EXAM: CT ABDOMEN AND PELVIS WITH CONTRAST TECHNIQUE: Multidetector CT imaging of the abdomen and pelvis was performed using the standard protocol following bolus administration of intravenous contrast. CONTRAST:  100mL OMNIPAQUE IOHEXOL 300 MG/ML  SOLN COMPARISON:  06/26/2019 FINDINGS: Lower chest: Patchy bilateral lower lobe opacities, likely atelectasis.  Trace bilateral pleural effusions. Three vessel coronary atherosclerosis. Hepatobiliary: Liver is within normal limits. High density sludge versus vicarious excretion of contrast in the gallbladder. No gallbladder wall thickening or inflammatory changes. No intrahepatic or extrahepatic ductal dilatation. Pancreas: Within normal limits. Spleen: Within normal limits. Adrenals/Urinary Tract: Adrenal glands are within normal limits. Bilateral renal cysts, measuring up to 3.5 cm in the medial left upper kidney (series 2/image 30). No hydronephrosis. Bladder is within normal limits. Stomach/Bowel: Enteric tube terminates in the proximal gastric body. Multiple dilated loops of small bowel throughout the central abdomen. Given the additional findings, this favors  adynamic ileus over small bowel obstruction. Residual appendix status post perforation (coronal image 49) demonstrates improved inflammation distally but stable localized perforation at the base (series 2/image 63). There are now two well-defined abscesses in the right lower quadrant adjacent to the cecum, measuring 2.9 x 4.4 x 6.8 cm along the inferior margin of the right liver (series 2/image 49) and measuring 3.4 x 3.3 x 6.6 cm along the right pericolic gutter. Vascular/Lymphatic: No evidence of abdominal aortic aneurysm. Atherosclerotic calcifications of the abdominal aorta and branch vessels. No suspicious abdominopelvic lymphadenopathy. Reproductive: Prostate is unremarkable. Other: Trace pelvic ascites. No free air. Musculoskeletal: Mild degenerative changes of the visualized thoracolumbar spine. IMPRESSION: Acute appendicitis with localized perforation at the appendiceal base, as noted on the prior. Interval development of two well-defined abscesses in the right lower quadrant, measuring 6.8 and 6.6 cm, as described above. No free air. Multiple dilated loops of small bowel throughout the central abdomen, favoring adynamic ileus over small bowel obstruction  given the additional findings. Enteric tube terminates in the proximal gastric body. Additional ancillary findings as above. These results will be called to the ordering clinician or representative by the Radiologist Assistant, and communication documented in the PACS or [employer]. Electronically Signed   By: Charline Bills M.D.   On: 06/30/2019 16:01   CT Abdomen Pelvis W Contrast  Result Date: 06/26/2019 CLINICAL DATA:  Abdominal abscess/infection suspected EXAM: CT ABDOMEN AND PELVIS WITH CONTRAST TECHNIQUE: Multidetector CT imaging of the abdomen and pelvis was performed using the standard protocol following bolus administration of intravenous contrast. CONTRAST:  OMNIPAQUE IOHEXOL 300 MG/ML  SOLN COMPARISON:  None. FINDINGS: Lower chest:  Coronary atherosclerosis.  Mild dependent atelectasis. Hepatobiliary: No focal liver abnormality.No evidence of biliary obstruction or stone. Pancreas: Unremarkable. Spleen: Unremarkable. Adrenals/Urinary Tract: Negative adrenals. No hydronephrosis or stone. Simple appearing bilateral renal cystic densities. Unremarkable bladder. Stomach/Bowel: Dilated appendix with wall thickening and regional fat inflammation. Outer wall diameter is 18 mm. There is perforation at the base with extraluminal gas bubbles seen laterally. No organized collection. The appendix extends inferiorly from the cecum in the right lower quadrant. Colonic diverticulosis, limited in extent and mainly seen at the cecum. Vascular/Lymphatic: Atherosclerotic calcification that is widespread. No mass or adenopathy. Reproductive:Enlarged prostate up lifting the bladder base. Other: No ascites or pneumoperitoneum. Musculoskeletal: No acute abnormalities.  Diffuse disc degeneration. These results were called by telephone at the time of interpretation on 06/26/2019 at 9:51 am to provider [facility] , who verbally acknowledged these results. IMPRESSION: 1. Perforated appendicitis. The  extraluminal spillage is localized but not well organized near the base of the appendix. 2.  Aortic Atherosclerosis (ICD10-I70.0).  Coronary atherosclerosis. Electronically Signed   By: Marnee Spring M.D.   On: 06/26/2019 09:53   DG Chest Port 1 View  Result Date: 06/26/2019 CLINICAL DATA:  Fever and abdominal pain. EXAM: PORTABLE CHEST 1 VIEW COMPARISON:  None. FINDINGS: Midline trachea. Normal heart size for level of inspiration. Numerous leads and wires project over the chest. No pleural effusion or pneumothorax. Mild subsegmental atelectasis at both lung bases. IMPRESSION: No active disease. Electronically Signed   By: Jeronimo Greaves M.D.   On: 06/26/2019 08:22   DG Abd Portable 1V  Result Date: 06/29/2019 CLINICAL DATA:  NG tube placement. EXAM: PORTABLE ABDOMEN - 1 VIEW 10:16 a.m. COMPARISON:  06/29/2019 at 8:55 a.m. FINDINGS: NG tube tip is in the midbody of the stomach. Multiple dilated loops of small bowel in the mid abdomen consistent  with ileus or small-bowel obstruction. Minimal linear atelectasis at the lung bases. IMPRESSION: NG tube tip in the midbody of the stomach. Electronically Signed   By: Francene Boyers M.D.   On: 06/29/2019 10:30   DG Abd Portable 1V  Result Date: 06/29/2019 CLINICAL DATA:  Ileus EXAM: PORTABLE ABDOMEN - 1 VIEW COMPARISON:  CT abdomen and pelvis June 26, 2019 FINDINGS: There remain loops of dilated bowel, primarily in the left abdomen. No appreciable air-fluid levels. Air is noted in the rectum. No free air. There are vascular calcifications in the pelvis. IMPRESSION: There remain loops of dilated bowel which may be indicative of ileus or enteritis. A degree of partial bowel obstruction cannot be excluded. No evident free air. Vascular calcification in pelvis. Electronically Signed   By: Bretta Bang III M.D.   On: 06/29/2019 09:09    Labs:  CBC: Recent Labs    06/28/19 0358 06/29/19 0420 06/30/19 0431 07/01/19 0444  WBC 9.6 10.2 8.1 8.7  HGB  12.0* 11.1* 10.6* 10.1*  HCT 36.7* 35.1* 32.8* 30.8*  PLT 133* 158 189 201    COAGS: Recent Labs    06/26/19 0714 06/27/19 0411  INR 1.0 1.4*    BMP: Recent Labs    06/27/19 0411 06/28/19 0358 06/30/19 0431 07/01/19 0444  NA 136 135 140 142  K 4.1 3.7 3.5 3.5  CL 105 104 104 108  CO2 23 22 25 26   GLUCOSE 92 108* 103* 103*  BUN 17 16 19 19   CALCIUM 7.8* 8.6* 8.6* 8.4*  CREATININE 1.27* 0.96 0.88 0.89  GFRNONAA 58* >60 >60 >60  GFRAA >60 >60 >60 >60    LIVER FUNCTION TESTS: Recent Labs    06/26/19 0714 07/01/19 0444  BILITOT 0.5 0.9  AST 19 16  ALT 17 17  ALKPHOS 43 40  PROT 7.7 6.5  ALBUMIN 3.7 2.8*    TUMOR MARKERS: No results for input(s): AFPTM, CEA, CA199, CHROMGRNA in the last 8760 hours.  Assessment and Plan:  69 y.o, male inpatient. Presented to this facility with RLQ pain x 1 day found to have a perforated appendix now with ilieus Team is requesting aspiration- abscess drain to intra abdominal fluid collection(s).  Pertinent Imaging 3.23.21 - CT abd pelvis reads Acute appendicitis with localized perforation at the appendiceal base, as noted on the prior.  Interval development of two well-defined abscesses in the right lower quadrant, measuring 6.8 and 6.6 cm, as described above. No free air.  Pertinent IR History none  Pertinent Allergies none  All labs and medications are within acceptable parameters. Blood cultures from 3.19.21 negative  Patient is afebrile.  IR consulted for possible intraabdominal abscess drain placement - aspiration. Case has been reviewed and procedure approved by Dr. 09-25-2003.  Patient tentatively scheduled for 3.24.21.  Team instructed to: Keep Patient to be NPO after midnight Hold prophylactic anticoagulation 24 hours prior to scheduled procedure.   IR will call patient when ready.   Risks and benefits discussed with the patient including bleeding, infection, damage to adjacent structures, bowel  perforation/fistula connection, and sepsis.  All of the patient's questions were answered, patient is agreeable to proceed. Consent signed and in chart.     Thank you for this interesting consult.  I greatly enjoyed meeting BRADDEN TADROS and look forward to participating in their care.  A copy of this report was sent to the requesting provider on this date.  Electronically Signed: 06-21-1972, NP 07/01/2019, 8:37 AM   I  spent a total of 40 Minutes    in face to face in clinical consultation, greater than 50% of which was counseling/coordinating care for  Abscess drain placement - aspiration

## 2019-07-01 NOTE — Procedures (Signed)
Interventional Radiology Procedure Note  Procedure: CT guided drainage of right abdominal abscess, with 12 F drain.  Possible communication with lower abscess.  ~30cc of purulent fluid aspirated for culture  Complications: None Recommendations:  - routine drain care - follow up culture - Do not submerge   Signed,  Yvone Neu. Loreta Ave, DO

## 2019-07-01 NOTE — Progress Notes (Signed)
PHARMACY - PHYSICIAN COMMUNICATION CRITICAL VALUE ALERT - BLOOD CULTURE IDENTIFICATION (BCID)  Jeffrey Good is an 69 y.o. male who presented to [facility] on 06/26/2019 with a chief complaint of RLQ pain.   Assessment: perforated appendicitis with ileus. Blood cultures from 06/26/19: 1/4 bottles with gram positive rods.   Name of physician (or Provider) Contacted: Dr. Corliss Skains  Current antibiotics: Zosyn   Changes to prescribed antibiotics recommended:  No change to antibiotics at this time. Follow up finalized blood cultures, abscess cultures.  No results found for this or any previous visit.  Jeffrey Good 07/01/2019  10:04 AM

## 2019-07-01 NOTE — Progress Notes (Addendum)
[facility] Progress Note     Subjective: Patient denies abdominal pain. NGT has been clamped since yesterday afternoon - no nausea, no vomiting. Reports BMx3 yesterday and another BM this morning. Having flatus. Currently NPO for IR drain today.  Review of Systems  Constitutional: Negative for chills and fever.  HENT: Positive for sore throat.   Respiratory: Negative for shortness of breath.   Cardiovascular: Negative for chest pain.  Gastrointestinal: Negative for abdominal pain, constipation, nausea and vomiting.  Genitourinary: Negative for dysuria, frequency and urgency.     Objective: Vital signs in last 24 hours: Temp:  [98.3 F (36.8 C)-99.6 F (37.6 C)] 99.2 F (37.3 C) (03/24 0508) Pulse Rate:  [73-83] 76 (03/24 0508) Resp:  [17-18] 18 (03/24 0508) BP: (142-155)/(79-89) 142/79 (03/24 0508) SpO2:  [93 %-96 %] 93 % (03/24 0508) Last BM Date: 06/30/19  Intake/Output from previous day: 03/23 0701 - 03/24 0700 In: 2792.2 [P.O.:180; I.V.:2479.5; IV Piggyback:132.7] Out: 2450 [Urine:1600; Emesis/NG output:850] Intake/Output this shift: No intake/output data recorded.  PE: General: pleasant, WD, WN male who is laying in bed in NAD HEENT: head is normocephalic, atraumatic.  Sclera are noninjected.  PERRL.  Ears and nose without any masses or lesions.  Mouth is pink and moist Heart: regular, rate, and rhythm.  Lungs:  Respiratory effort nonlabored Abd: soft, NT and no guarding or peritonitis, distended, no masses, hernias, or organomegaly MS: all 4 extremities are symmetrical with no cyanosis, clubbing, or edema. Skin: warm and dry with no masses, lesions, or rashes Neuro: Cranial nerves 2-12 grossly intact, sensation grossly intact throughout Psych: A&Ox3 with an appropriate affect.  Lab Results:  Recent Labs    06/30/19 0431 07/01/19 0444  WBC 8.1 8.7  HGB 10.6* 10.1*  HCT 32.8* 30.8*  PLT 189 201   BMET Recent Labs    06/30/19 0431  07/01/19 0444  NA 140 142  K 3.5 3.5  CL 104 108  CO2 25 26  GLUCOSE 103* 103*  BUN 19 19  CREATININE 0.88 0.89  CALCIUM 8.6* 8.4*   PT/INR No results for input(s): LABPROT, INR in the last 72 hours. CMP     Component Value Date/Time   NA 142 07/01/2019 0444   K 3.5 07/01/2019 0444   CL 108 07/01/2019 0444   CO2 26 07/01/2019 0444   GLUCOSE 103 (H) 07/01/2019 0444   BUN 19 07/01/2019 0444   CREATININE 0.89 07/01/2019 0444   CREATININE 1.16 04/05/2016 1408   CALCIUM 8.4 (L) 07/01/2019 0444   PROT 6.5 07/01/2019 0444   PROT 7.3 11/16/2014 1635   ALBUMIN 2.8 (L) 07/01/2019 0444   AST 16 07/01/2019 0444   ALT 17 07/01/2019 0444   ALKPHOS 40 07/01/2019 0444   BILITOT 0.9 07/01/2019 0444   GFRNONAA >60 07/01/2019 0444   GFRAA >60 07/01/2019 0444   Lipase     Component Value Date/Time   LIPASE 31 06/26/2019 0714       Studies/Results: CT ABDOMEN PELVIS W CONTRAST  Result Date: 06/30/2019 CLINICAL DATA:  Perforated appendicitis, persistent ileus EXAM: CT ABDOMEN AND PELVIS WITH CONTRAST TECHNIQUE: Multidetector CT imaging of the abdomen and pelvis was performed using the standard protocol following bolus administration of intravenous contrast. CONTRAST:  OMNIPAQUE IOHEXOL 300 MG/ML  SOLN COMPARISON:  06/26/2019 FINDINGS: Lower chest: Patchy bilateral lower lobe opacities, likely atelectasis. Trace bilateral pleural effusions. Three vessel coronary atherosclerosis. Hepatobiliary: Liver is within normal limits. High density sludge versus vicarious excretion of contrast in the  gallbladder. No gallbladder wall thickening or inflammatory changes. No intrahepatic or extrahepatic ductal dilatation. Pancreas: Within normal limits. Spleen: Within normal limits. Adrenals/Urinary Tract: Adrenal glands are within normal limits. Bilateral renal cysts, measuring up to 3.5 cm in the medial left upper kidney (series 2/image 30). No hydronephrosis. Bladder is within normal limits.  Stomach/Bowel: Enteric tube terminates in the proximal gastric body. Multiple dilated loops of small bowel throughout the central abdomen. Given the additional findings, this favors adynamic ileus over small bowel obstruction. Residual appendix status post perforation (coronal image 49) demonstrates improved inflammation distally but stable localized perforation at the base (series 2/image 63). There are now two well-defined abscesses in the right lower quadrant adjacent to the cecum, measuring 2.9 x 4.4 x 6.8 cm along the inferior margin of the right liver (series 2/image 49) and measuring 3.4 x 3.3 x 6.6 cm along the right pericolic gutter. Vascular/Lymphatic: No evidence of abdominal aortic aneurysm. Atherosclerotic calcifications of the abdominal aorta and branch vessels. No suspicious abdominopelvic lymphadenopathy. Reproductive: Prostate is unremarkable. Other: Trace pelvic ascites. No free air. Musculoskeletal: Mild degenerative changes of the visualized thoracolumbar spine. IMPRESSION: Acute appendicitis with localized perforation at the appendiceal base, as noted on the prior. Interval development of two well-defined abscesses in the right lower quadrant, measuring 6.8 and 6.6 cm, as described above. No free air. Multiple dilated loops of small bowel throughout the central abdomen, favoring adynamic ileus over small bowel obstruction given the additional findings. Enteric tube terminates in the proximal gastric body. Additional ancillary findings as above. These results will be called to the ordering clinician or representative by the Radiologist Assistant, and communication documented in the PACS or [employer]. Electronically Signed   By: Charline Bills M.D.   On: 06/30/2019 16:01   DG Abd Portable 1V  Result Date: 06/29/2019 CLINICAL DATA:  NG tube placement. EXAM: PORTABLE ABDOMEN - 1 VIEW 10:16 a.m. COMPARISON:  06/29/2019 at 8:55 a.m. FINDINGS: NG tube tip is in the midbody of the stomach.  Multiple dilated loops of small bowel in the mid abdomen consistent with ileus or small-bowel obstruction. Minimal linear atelectasis at the lung bases. IMPRESSION: NG tube tip in the midbody of the stomach. Electronically Signed   By: Francene Boyers M.D.   On: 06/29/2019 10:30    Anti-infectives: Anti-infectives (From admission, onward)   Start     Dose/Rate Route Frequency Ordered Stop   06/26/19 1300  piperacillin-tazobactam (ZOSYN) IVPB 3.375 g     3.375 g 12.5 mL/hr over 240 Minutes Intravenous Every 8 hours 06/26/19 1155     06/26/19 0845  cefTRIAXone (ROCEPHIN) 2 g in sodium chloride 0.9 % 100 mL IVPB     2 g 200 mL/hr over 30 Minutes Intravenous  Once 06/26/19 0832 06/26/19 0919   06/26/19 0845  metroNIDAZOLE (FLAGYL) IVPB 500 mg     500 mg 100 mL/hr over 60 Minutes Intravenous  Once 06/26/19 0832 06/26/19 1026       Assessment/Plan HTN HLD  Perforated appendicitis with ileus - CT 3/19 with perforated appendicitis but no organized fluid collection or abscess - WBC 8.7, afebrile - NGT placed 3/22 for ileus, now having some return of bowel function, passed NG clamp trial, removed NGT during my exam. - repeat CT 3/23 with two intra-abdominal fluid collections, IR consulted and planning to perform drainage today. Consider advancing diet and resuming DVT prophylaxis after procedure.  FEN: NPO, IVF VTE: SCDs, chemical VTE held for procedure  ID: rocephin/flagyl 3/19; Zosyn 3/19>>  LOS: 5 days    [facility] 07/01/2019, 10:08 AM Please see Amion for pager number during day hours 7:00am-4:30pm

## 2019-07-02 MED ORDER — SODIUM CHLORIDE 0.9 % IV SOLN
INTRAVENOUS | Status: DC | PRN
  Administered 2019-07-02: 250 mL via INTRAVENOUS

## 2019-07-02 MED ORDER — ENOXAPARIN SODIUM 40 MG/0.4ML ~~LOC~~ SOLN
40.0000 mg | SUBCUTANEOUS | Status: DC
  Administered 2019-07-02 – 2019-07-04 (×3): 40 mg via SUBCUTANEOUS
  Filled 2019-07-02 (×3): qty 0.4

## 2019-07-02 MED ORDER — SODIUM CHLORIDE 0.9% FLUSH
3.0000 mL | Freq: Two times a day (BID) | INTRAVENOUS | Status: DC
  Administered 2019-07-03: 3 mL via INTRAVENOUS

## 2019-07-02 MED ORDER — SODIUM CHLORIDE 0.9 % IV SOLN: 250.0000 mL | INTRAVENOUS | Status: DC | PRN

## 2019-07-02 MED ORDER — GABAPENTIN 300 MG PO CAPS
300.0000 mg | ORAL_CAPSULE | Freq: Three times a day (TID) | ORAL | Status: DC
  Administered 2019-07-02 – 2019-07-04 (×5): 300 mg via ORAL
  Filled 2019-07-02 (×6): qty 1

## 2019-07-02 MED ORDER — SODIUM CHLORIDE 0.9% FLUSH: 3.0000 mL | INTRAVENOUS | Status: DC | PRN

## 2019-07-02 NOTE — Progress Notes (Signed)
Pharmacy Antibiotic Note  Jeffrey Good is a 69 y.o. 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  Pharmacy has been consulted for Zosyn dosing.  Today, 07/02/2019:  D7 abx  Afebrile since 3/21  WBC stable WNL  SCr returned to baseline   Plan: Will continue Zosyn 3.375g IV q8h (4 hour infusion).   Height: 5\' 10"  (177.8 cm) Weight: 226 lb (102.5 kg) IBW/kg (Calculated) : 73  Temp (24hrs), Avg:98.8 F (37.1 C), Min:98.5 F (36.9 C), Max:99.1 F (37.3 C)  Recent Labs  Lab 06/26/19 0714 06/26/19 0715 06/26/19 1210 06/26/19 1210 06/27/19 0411 06/28/19 0358 06/29/19 0420 06/30/19 0431 07/01/19 0444  WBC   < >  --  7.1   < > 8.9 9.6 10.2 8.1 8.7  CREATININE   < >  --  1.14  --  1.27* 0.96  --  0.88 0.89  LATICACIDVEN  --  2.2* 1.7  --   --   --   --   --   --    < > = values in this interval not displayed.    Estimated Creatinine Clearance: 97.9 mL/min (by C-G formula based on SCr of 0.89 mg/dL).    Allergies  Allergen Reactions  . Pregabalin Nausea Only    Pt states he doesn't recall this allergy   Antimicrobials this admission: 3/19 Rocephin/Flagyl x1 3/19 Zosyn >>   Dose adjustments this admission:  Microbiology results: 3/19 BCx: GPR 1/4 3/19 UCx: NGF 3/19 Influenza A/B: neg/neg 3/19 COVID: neg 3/24 abd abscess Cx: abundant GNR, mod GPR  Thank you for allowing pharmacy to be a part of this patient's care.  Lachrista Heslin A 07/02/2019 12:54 PM

## 2019-07-02 NOTE — Progress Notes (Addendum)
[facility] Progress Note     Subjective: Patient reports some pain at drain site but states it has improved some since placement. Tolerating CLD without nausea. No flatus or BM yet today.   Review of Systems  Constitutional: Negative for chills and fever.  Respiratory: Negative for shortness of breath.   Cardiovascular: Negative for chest pain.  Gastrointestinal: Positive for abdominal pain (mild at drain site). Negative for nausea and vomiting.  Genitourinary: Negative for dysuria, frequency and urgency.     Objective: Vital signs in last 24 hours: Temp:  [98.5 F (36.9 C)-99.1 F (37.3 C)] 98.9 F (37.2 C) (03/25 0942) Pulse Rate:  [62-76] 68 (03/25 0942) Resp:  [13-20] 18 (03/25 0942) BP: (128-167)/(80-96) 167/96 (03/25 0942) SpO2:  [93 %-100 %] 98 % (03/25 0942) Last BM Date: 06/30/19  Intake/Output from previous day: 03/24 0701 - 03/25 0700 In: 1697.5 [P.O.:1000; I.V.:647.5; IV Piggyback:50] Out: 1390 [Urine:1200; Drains:190] Intake/Output this shift: No intake/output data recorded.  PE: General: pleasant, WD, WN male who is laying in bed in NAD HEENT:Sclera are noninjected. PERRL. Ears and nose without any masses or lesions. Mouth is pink and moist Heart: regular, rate, and rhythm. Normal s1,s2. No obvious murmurs, gallops, or rubs noted. Palpable radial and pedal pulses bilaterally Lungs: CTAB, no wheezes, rhonchi, or rales noted. Respiratory effort nonlabored Abd: soft, mildly ttp at RLQ drain site, RLQ drain with purulent material,ND,BS hypoactive, no masses, hernias, or organomegaly MS: all 4 extremities are symmetrical with no cyanosis, clubbing, or edema. Skin: warm and dry with no masses, lesions, or rashes Neuro: Cranial nerves 2-12 grossly intact, sensation grossly intact throughout Psych: A&Ox3 with an appropriate affect.   Lab Results:  Recent Labs    06/30/19 0431 07/01/19 0444  WBC 8.1 8.7  HGB 10.6* 10.1*  HCT 32.8* 30.8*   PLT 189 201   BMET Recent Labs    06/30/19 0431 07/01/19 0444  NA 140 142  K 3.5 3.5  CL 104 108  CO2 25 26  GLUCOSE 103* 103*  BUN 19 19  CREATININE 0.88 0.89  CALCIUM 8.6* 8.4*   PT/INR No results for input(s): LABPROT, INR in the last 72 hours. CMP     Component Value Date/Time   NA 142 07/01/2019 0444   K 3.5 07/01/2019 0444   CL 108 07/01/2019 0444   CO2 26 07/01/2019 0444   GLUCOSE 103 (H) 07/01/2019 0444   BUN 19 07/01/2019 0444   CREATININE 0.89 07/01/2019 0444   CREATININE 1.16 04/05/2016 1408   CALCIUM 8.4 (L) 07/01/2019 0444   PROT 6.5 07/01/2019 0444   PROT 7.3 11/16/2014 1635   ALBUMIN 2.8 (L) 07/01/2019 0444   AST 16 07/01/2019 0444   ALT 17 07/01/2019 0444   ALKPHOS 40 07/01/2019 0444   BILITOT 0.9 07/01/2019 0444   GFRNONAA >60 07/01/2019 0444   GFRAA >60 07/01/2019 0444   Lipase     Component Value Date/Time   LIPASE 31 06/26/2019 0714       Studies/Results: CT ABDOMEN PELVIS W CONTRAST  Result Date: 06/30/2019 CLINICAL DATA:  Perforated appendicitis, persistent ileus EXAM: CT ABDOMEN AND PELVIS WITH CONTRAST TECHNIQUE: Multidetector CT imaging of the abdomen and pelvis was performed using the standard protocol following bolus administration of intravenous contrast. CONTRAST:  OMNIPAQUE IOHEXOL 300 MG/ML  SOLN COMPARISON:  06/26/2019 FINDINGS: Lower chest: Patchy bilateral lower lobe opacities, likely atelectasis. Trace bilateral pleural effusions. Three vessel coronary atherosclerosis. Hepatobiliary: Liver is within normal limits. High density  sludge versus vicarious excretion of contrast in the gallbladder. No gallbladder wall thickening or inflammatory changes. No intrahepatic or extrahepatic ductal dilatation. Pancreas: Within normal limits. Spleen: Within normal limits. Adrenals/Urinary Tract: Adrenal glands are within normal limits. Bilateral renal cysts, measuring up to 3.5 cm in the medial left upper kidney (series 2/image 30). No  hydronephrosis. Bladder is within normal limits. Stomach/Bowel: Enteric tube terminates in the proximal gastric body. Multiple dilated loops of small bowel throughout the central abdomen. Given the additional findings, this favors adynamic ileus over small bowel obstruction. Residual appendix status post perforation (coronal image 49) demonstrates improved inflammation distally but stable localized perforation at the base (series 2/image 63). There are now two well-defined abscesses in the right lower quadrant adjacent to the cecum, measuring 2.9 x 4.4 x 6.8 cm along the inferior margin of the right liver (series 2/image 49) and measuring 3.4 x 3.3 x 6.6 cm along the right pericolic gutter. Vascular/Lymphatic: No evidence of abdominal aortic aneurysm. Atherosclerotic calcifications of the abdominal aorta and branch vessels. No suspicious abdominopelvic lymphadenopathy. Reproductive: Prostate is unremarkable. Other: Trace pelvic ascites. No free air. Musculoskeletal: Mild degenerative changes of the visualized thoracolumbar spine. IMPRESSION: Acute appendicitis with localized perforation at the appendiceal base, as noted on the prior. Interval development of two well-defined abscesses in the right lower quadrant, measuring 6.8 and 6.6 cm, as described above. No free air. Multiple dilated loops of small bowel throughout the central abdomen, favoring adynamic ileus over small bowel obstruction given the additional findings. Enteric tube terminates in the proximal gastric body. Additional ancillary findings as above. These results will be called to the ordering clinician or representative by the Radiologist Assistant, and communication documented in the PACS or [employer]. Electronically Signed   By: Julian Hy M.D.   On: 06/30/2019 16:01   CT IMAGE GUIDED FLUID DRAIN BY CATHETER  Result Date: 07/01/2019 INDICATION: 69 year old male with a history of abdominal abscess EXAM: IMAGE GUIDED ABSCESS DRAIN  PLACEMENT MEDICATIONS: The patient is currently admitted to the hospital and receiving intravenous antibiotics. The antibiotics were administered within an appropriate time frame prior to the initiation of the procedure. ANESTHESIA/SEDATION: Fentanyl 100 mcg IV; Versed 2.0 mg IV Moderate Sedation Time:  18 minutes The patient was continuously monitored during the procedure by the interventional radiology nurse under my direct supervision. COMPLICATIONS: None PROCEDURE: Informed written consent was obtained from the patient after a thorough discussion of the procedural risks, benefits and alternatives. All questions were addressed. Maximal Sterile Barrier Technique was utilized including caps, mask, sterile gowns, sterile gloves, sterile drape, hand hygiene and skin antiseptic. A timeout was performed prior to the initiation of the procedure. Patient positioned supine position on the CT gantry table. Scout CT was acquired for planning purposes. 1% lidocaine was used for local anesthesia. Using modified Seldinger technique, a 12 French drain was placed into the upper portion of abscess in the right abdomen, with the initial placement into the sub a [place] region. Approximately 30 cc purulent fluid aspirated. Drain was attached to bulb suction and sutured in position. Sterile dressing was placed. Patient tolerated the procedure well and remained hemodynamically stable throughout. No complications were encountered and no significant blood loss. IMPRESSION: Status post CT-guided drainage of right abdominal abscess. Sample was sent for culture. Signed, Dulcy Fanny. Dellia Nims, RPVI [facility] Electronically Signed   By: Corrie Mckusick D.O.   On: 07/01/2019 16:49    Anti-infectives: Anti-infectives (From admission, onward)  Start     Dose/Rate Route Frequency Ordered Stop   06/26/19 1300  piperacillin-tazobactam (ZOSYN) IVPB 3.375 g     3.375 g 12.5 mL/hr  over 240 Minutes Intravenous Every 8 hours 06/26/19 1155     06/26/19 0845  cefTRIAXone (ROCEPHIN) 2 g in sodium chloride 0.9 % 100 mL IVPB     2 g 200 mL/hr over 30 Minutes Intravenous  Once 06/26/19 0832 06/26/19 0919   06/26/19 0845  metroNIDAZOLE (FLAGYL) IVPB 500 mg     500 mg 100 mL/hr over 60 Minutes Intravenous  Once 06/26/19 9381 06/26/19 1026       Assessment/Plan HTN HLD  Perforated appendicitis with abscess - CT 3/19 with perforated appendicitis but no organized fluid collection or abscess - CT 3/23 with 2 abscesses in RLQ  - WBC 8.7 yesterday, afebrile - IR drain placed yesterday, cx pending - tolerating CLD - advance to FLD today  - mobilize - likely home in the next 2 days with drain and PO abx, will plan follow up in office to consider interval laparoscopic appendectomy in 6-8 weeks   FEN: FLD VTE: SCDs, lovenox  ID: rocephin/flagyl 3/19; Zosyn 3/19>>   LOS: 6 days    [name] , [facility] 07/02/2019, 9:55 AM Please see Amion for pager number during day hours 7:00am-4:30pm

## 2019-07-03 LAB — CBC
HCT: 33.9 % — ABNORMAL LOW (ref 39.0–52.0)
Hemoglobin: 10.9 g/dL — ABNORMAL LOW (ref 13.0–17.0)
MCH: 28.4 pg (ref 26.0–34.0)
MCHC: 32.2 g/dL (ref 30.0–36.0)
MCV: 88.3 fL (ref 80.0–100.0)
Platelets: 245 10*3/uL (ref 150–400)
RBC: 3.84 MIL/uL — ABNORMAL LOW (ref 4.22–5.81)
RDW: 14.1 % (ref 11.5–15.5)
WBC: 9.1 10*3/uL (ref 4.0–10.5)
nRBC: 0 % (ref 0.0–0.2)

## 2019-07-03 LAB — BASIC METABOLIC PANEL
Anion gap: 10 (ref 5–15)
BUN: 9 mg/dL (ref 8–23)
CO2: 26 mmol/L (ref 22–32)
Calcium: 8.6 mg/dL — ABNORMAL LOW (ref 8.9–10.3)
Chloride: 102 mmol/L (ref 98–111)
Creatinine, Ser: 0.82 mg/dL (ref 0.61–1.24)
GFR calc Af Amer: >60 mL/min (ref >60)
GFR calc non Af Amer: >60 mL/min (ref >60)
Glucose, Bld: 115 mg/dL — ABNORMAL HIGH (ref 70–99)
Potassium: 3.6 mmol/L (ref 3.5–5.1)
Sodium: 138 mmol/L (ref 135–145)

## 2019-07-03 MED ORDER — POLYETHYLENE GLYCOL 3350 17 G PO PACK
17.0000 g | PACK | Freq: Every day | ORAL | Status: DC
  Administered 2019-07-03: 17 g via ORAL
  Filled 2019-07-03: qty 1

## 2019-07-03 MED ORDER — CEFDINIR 300 MG PO CAPS: 300.0000 mg | ORAL_CAPSULE | Freq: Two times a day (BID) | ORAL | 0 refills | Status: AC

## 2019-07-03 MED ORDER — ACETAMINOPHEN 325 MG PO TABS: 650.0000 mg | ORAL_TABLET | Freq: Four times a day (QID) | ORAL | Status: DC | PRN

## 2019-07-03 MED ORDER — CEFDINIR 300 MG PO CAPS
300.0000 mg | ORAL_CAPSULE | Freq: Two times a day (BID) | ORAL | Status: DC
  Administered 2019-07-03 – 2019-07-04 (×3): 300 mg via ORAL
  Filled 2019-07-03 (×3): qty 1

## 2019-07-03 MED ORDER — NORMAL SALINE FLUSH 0.9 % IV SOLN: 10.0000 mL | Freq: Every day | INTRAVENOUS | 0 refills | Status: DC

## 2019-07-03 MED ORDER — DOCUSATE SODIUM 100 MG PO CAPS: 100.0000 mg | ORAL_CAPSULE | Freq: Every day | ORAL | Status: DC | PRN

## 2019-07-03 MED ORDER — DOCUSATE SODIUM 100 MG PO CAPS
100.0000 mg | ORAL_CAPSULE | Freq: Two times a day (BID) | ORAL | Status: DC
  Administered 2019-07-03 – 2019-07-04 (×3): 100 mg via ORAL
  Filled 2019-07-03 (×3): qty 1

## 2019-07-03 MED ORDER — POLYETHYLENE GLYCOL 3350 17 G PO PACK: 17.0000 g | PACK | Freq: Every day | ORAL | Status: DC | PRN

## 2019-07-03 NOTE — Discharge Instructions (Signed)
Percutaneous Abscess Drain Flush drain with 5 cc of sterile saline twice a day.   Record drainage, color and volume each day. Call radiology if there is a change. Percutaneous abscess drain is removal of a collection of infected fluid inside the body (abscess). This is done by placing a thin needle under the skin and moving it into the abscess. A small tube (catheter) is inserted during the procedure and left in place for a few days to continue to drain the abscess. Tell a health care provider about:  Any allergies you have.  All medicines you are taking, including vitamins, herbs, eye drops, creams, and over-the-counter medicines.  Any problems you or family members have had with anesthetic medicines.  Any blood disorders you have.  Any surgeries you have had.  Any medical conditions you have.  Whether you are pregnant or may be pregnant.  Any history of tobacco use or smoking. What are the risks? Generally, this is a safe procedure. However, problems may occur, including:  Infection.  Bleeding.  Allergic reaction to medicines or materials used.  Damage to other structures or organs.  Blockage of the catheter, requiring placement of a new catheter.  A need to repeat the procedure.  Failure of the procedure to drain the abscess completely, requiring an open surgical procedure to drain the abscess. An open procedure is done through a larger incision. What happens before the procedure?  Medicines  Ask your health care provider about: ? Changing or stopping your regular medicines. This is especially important if you are taking diabetes medicines or blood thinners. ? Taking medicines such as aspirin and ibuprofen. These medicines can thin your blood. Do not take these medicines before your procedure if your health care provider instructs you not to. Staying hydrated Follow instructions from your health care provider about hydration, which may include:  Up to 2 hours before  the procedure - you may continue to drink clear liquids, such as water, clear fruit juice, black coffee, and plain tea. Eating and drinking restrictions Follow instructions from your health care provider about eating and drinking, which may include:  8 hours before the procedure - stop eating heavy meals or foods such as meat, fried foods, or fatty foods.  6 hours before the procedure - stop eating light meals or foods, such as toast or cereal.  6 hours before the procedure - stop drinking milk or drinks that contain milk.  2 hours before the procedure - stop drinking clear liquids. General instructions  Plan to have someone take you home from the hospital or clinic.  If you will be going home right after the procedure, plan to have someone with you for 24 hours.  You may have blood tests or urine tests.  You may get a tetanus shot.  You may have imaging tests, such as an ultrasound, to check how large or deep your abscess is. What happens during the procedure?  To lower your risk of infection: ? Your health care team will wash or sanitize their hands. ? The skin around the abscess will be washed with soap. ? Hair may be removed from the surgical site.  An IV tube will be inserted into one of your veins.  You will be given medicine to numb the area (local anesthetic) where the catheter will be placed. Placement of the catheter varies depending on where your abscess is located.  You may be given medicine to help you relax (sedative) or medicine to make you fall  asleep (general anesthetic).  A small incision will be made in your skin.  A needle will be inserted under your skin and moved into the abscess. Images from ultrasound, X-ray, or a CT scan will be used to help guide the needle to the abscess.  A catheter will be inserted into your incision and moved underneath your skin until it reaches the abscess. Images will be used to help guide the catheter to the abscess.  After  the catheter is in place, the needle will be removed. The catheter will be connected to a bag outside of your body. The catheter will stay in place until the fluid has stopped draining and the infection is gone. The procedure may vary among health care providers and hospitals. What happens after the procedure?  Your blood pressure, heart rate, breathing rate, and blood oxygen level will be monitored until the medicines you were given have worn off.  You may have some pain or nausea. Medicines will be available to help you. Summary  An abscess is a collection of infected fluid inside the body.  During this procedure, images from ultrasound, X-rays, or a CT scan are used to help guide the needle and catheter to the abscess.  A catheter will be left in place to continue to drain the abscess after the procedure. This information is not intended to replace advice given to you by your health care provider. Make sure you discuss any questions you have with your health care provider. Document Revised: 03/08/2017 Document Reviewed: 02/16/2016 Elsevier Patient Education  2020 [employer]   Surgical Drain Record Empty your surgical drain as told by your health care provider. Use this form to write down the amount of fluid that has collected in the drainage container. Bring this form with you to your follow-up visits.   Date __________ Time __________ Amount __________ Date __________ Time __________ Amount __________ Date __________ Time __________ Amount __________ Date __________ Time __________ Amount __________ Date __________ Time __________ Amount __________ Date __________ Time __________ Amount __________ Date __________ Time __________ Amount __________ Date __________ Time __________ Amount __________ Date __________ Time __________ Amount __________ Date __________ Time __________ Amount __________ Date __________ Time __________ Amount __________ Date __________ Time __________  Amount __________ Date __________ Time __________ Amount __________ Date __________ Time __________ Amount __________ Date __________ Time __________ Amount __________ Date __________ Time __________ Amount __________ Date __________ Time __________ Amount __________ Date __________ Time __________ Amount __________ Date __________ Time __________ Amount __________ Date __________ Time __________ Amount __________ Date __________ Time __________ Amount __________ Date __________ Time __________ Amount __________ Date __________ Time __________ Amount __________ Date __________ Time __________ Amount __________ Date __________ Time __________ Amount __________ Date __________ Time __________ Amount __________ Date __________ Time __________ Amount __________ Date __________ Time __________ Amount __________ Date __________ Time __________ Amount __________ Date __________ Time __________ Amount __________ Date __________ Time __________ Amount __________ Date __________ Time __________ Amount __________ Date __________ Time __________ Amount __________ Date __________ Time __________ Amount __________ Date __________ Time __________ Amount __________ Date __________ Time __________ Amount __________ Date __________ Time __________ Amount __________ Date __________ Time __________ Amount __________ Date __________ Time __________ Amount __________ Date __________ Time __________ Amount __________ Date __________ Time __________ Amount __________ Date __________ Time __________ Amount __________ This information is not intended to replace advice given to you by your health care provider. Make sure you discuss any questions you have with your health care provider. Document Revised: 12/31/2016 Document Reviewed:  12/31/2016 Elsevier Patient Education  [place].

## 2019-07-03 NOTE — Progress Notes (Signed)
Taught pt how to empty, charge, and flush his drain. Also showed him how to change his dressing. He did a return demonstration and seems to understand what to do.

## 2019-07-03 NOTE — Progress Notes (Signed)
Referring Physician(s): Tsuei,M  Supervising Physician: Malachy Moan  Patient Status:  [facility] - In-pt  Chief Complaint: Abdominal pain/abscess   Subjective: Patient doing fairly well this morning; states he feels better since right lower abdominal abscess drained; has only mild pain at drain insertion site; denies nausea or vomiting.  Tolerating liquids okay.   Allergies: Pregabalin  Medications: Prior to Admission medications   Medication Sig Start Date End Date Taking? Authorizing Provider  carvedilol (COREG) 25 MG tablet Take 1 tablet by mouth twice daily Patient taking differently: Take 25 mg by mouth in the morning and at bedtime.  06/03/19  Yes Nahser, Deloris Ping, MD  Cetirizine HCl (ZYRTEC ALLERGY) 10 MG CAPS Take 10 mg by mouth daily.  11/15/08  Yes [provider]  doxazosin (CARDURA) 8 MG tablet Take 1 tablet (8 mg total) by mouth at bedtime. 06/03/19  Yes Nahser, Deloris Ping, MD  gabapentin (NEURONTIN) 300 MG capsule TAKE 1 CAPSULE BY MOUTH THREE TIMES A DAY Patient taking differently: Take 300 mg by mouth 3 (three) times daily.  12/22/18  Yes Lomax, Amy, NP  HYDROcodone-acetaminophen (NORCO/VICODIN) 5-325 MG tablet Take one or two po daily as needed Patient taking differently: Take 1-2 tablets by mouth daily as needed for moderate pain.  06/18/19  Yes Sater, Pearletha Furl, MD  lamoTRIgine (LAMICTAL) 150 MG tablet TAKE 1 TABLET BY MOUTH TWICE A DAY Patient taking differently: Take 150 mg by mouth 2 (two) times daily.  04/28/19  Yes Lomax, Amy, NP  lisinopril (ZESTRIL) 20 MG tablet Take 1 tablet by mouth twice daily Patient taking differently: Take 20 mg by mouth in the morning and at bedtime.  06/11/19  Yes Nahser, Deloris Ping, MD  meloxicam (MOBIC) 7.5 MG tablet Take 1 tablet (7.5 mg total) by mouth daily. 11/26/18  Yes Lomax, Amy, NP  sildenafil (VIAGRA) 100 MG tablet TAKE ONE HALF TO ONE PILL AS NEEDED Patient taking differently: Take 50-100 mg by mouth as needed for  erectile dysfunction.  12/01/18  Yes Lomax, Amy, NP  spironolactone (ALDACTONE) 25 MG tablet TAKE 1 TABLET BY MOUTH ONCE DAILY . APPOINTMENT REQUIRED FOR FUTURE REFILLS Patient taking differently: Take 25 mg by mouth daily.  07/21/18  Yes Nahser, Deloris Ping, MD  potassium chloride (K-DUR,KLOR-CON) 10 MEQ tablet Take 1 tablet (10 mEq total) by mouth daily. 04/30/12 05/31/13  Nahser, Deloris Ping, MD     Vital Signs: BP 120/87 (BP Location: Right Arm)   Pulse 87   Temp 98.8 F (37.1 C) (Oral)   Resp 18   Ht 5\' 10"  (1.778 m)   Wt 226 lb (102.5 kg)   SpO2 97%   BMI 32.43 kg/m   Physical Exam awake, alert.  Right lower quadrant drain intact, insertion site okay, mildly tender to palpation, output 20 cc blood-tinged fluid yesterday, approximately 5 to 10 cc today.  Drain irrigated earlier this morning without difficulty.  Imaging: CT ABDOMEN PELVIS W CONTRAST  Result Date: 06/30/2019 CLINICAL DATA:  Perforated appendicitis, persistent ileus EXAM: CT ABDOMEN AND PELVIS WITH CONTRAST TECHNIQUE: Multidetector CT imaging of the abdomen and pelvis was performed using the standard protocol following bolus administration of intravenous contrast. CONTRAST:  07/02/2019 OMNIPAQUE IOHEXOL 300 MG/ML  SOLN COMPARISON:  06/26/2019 FINDINGS: Lower chest: Patchy bilateral lower lobe opacities, likely atelectasis. Trace bilateral pleural effusions. Three vessel coronary atherosclerosis. Hepatobiliary: Liver is within normal limits. High density sludge versus vicarious excretion of contrast in the gallbladder. No gallbladder wall thickening or inflammatory changes.  No intrahepatic or extrahepatic ductal dilatation. Pancreas: Within normal limits. Spleen: Within normal limits. Adrenals/Urinary Tract: Adrenal glands are within normal limits. Bilateral renal cysts, measuring up to 3.5 cm in the medial left upper kidney (series 2/image 30). No hydronephrosis. Bladder is within normal limits. Stomach/Bowel: Enteric tube terminates in  the proximal gastric body. Multiple dilated loops of small bowel throughout the central abdomen. Given the additional findings, this favors adynamic ileus over small bowel obstruction. Residual appendix status post perforation (coronal image 49) demonstrates improved inflammation distally but stable localized perforation at the base (series 2/image 63). There are now two well-defined abscesses in the right lower quadrant adjacent to the cecum, measuring 2.9 x 4.4 x 6.8 cm along the inferior margin of the right liver (series 2/image 49) and measuring 3.4 x 3.3 x 6.6 cm along the right pericolic gutter. Vascular/Lymphatic: No evidence of abdominal aortic aneurysm. Atherosclerotic calcifications of the abdominal aorta and branch vessels. No suspicious abdominopelvic lymphadenopathy. Reproductive: Prostate is unremarkable. Other: Trace pelvic ascites. No free air. Musculoskeletal: Mild degenerative changes of the visualized thoracolumbar spine. IMPRESSION: Acute appendicitis with localized perforation at the appendiceal base, as noted on the prior. Interval development of two well-defined abscesses in the right lower quadrant, measuring 6.8 and 6.6 cm, as described above. No free air. Multiple dilated loops of small bowel throughout the central abdomen, favoring adynamic ileus over small bowel obstruction given the additional findings. Enteric tube terminates in the proximal gastric body. Additional ancillary findings as above. These results will be called to the ordering clinician or representative by the Radiologist Assistant, and communication documented in the PACS or [employer]. Electronically Signed   By: Charline Bills M.D.   On: 06/30/2019 16:01   CT IMAGE GUIDED FLUID DRAIN BY CATHETER  Result Date: 07/01/2019 INDICATION: 69 year old male with a history of abdominal abscess EXAM: IMAGE GUIDED ABSCESS DRAIN PLACEMENT MEDICATIONS: The patient is currently admitted to the hospital and receiving  intravenous antibiotics. The antibiotics were administered within an appropriate time frame prior to the initiation of the procedure. ANESTHESIA/SEDATION: Fentanyl 100 mcg IV; Versed 2.0 mg IV Moderate Sedation Time:  18 minutes The patient was continuously monitored during the procedure by the interventional radiology nurse under my direct supervision. COMPLICATIONS: None PROCEDURE: Informed written consent was obtained from the patient after a thorough discussion of the procedural risks, benefits and alternatives. All questions were addressed. Maximal Sterile Barrier Technique was utilized including caps, mask, sterile gowns, sterile gloves, sterile drape, hand hygiene and skin antiseptic. A timeout was performed prior to the initiation of the procedure. Patient positioned supine position on the CT gantry table. Scout CT was acquired for planning purposes. 1% lidocaine was used for local anesthesia. Using modified Seldinger technique, a 12 French drain was placed into the upper portion of abscess in the right abdomen, with the initial placement into the sub a Paddock region. Approximately 30 cc purulent fluid aspirated. Drain was attached to bulb suction and sutured in position. Sterile dressing was placed. Patient tolerated the procedure well and remained hemodynamically stable throughout. No complications were encountered and no significant blood loss. IMPRESSION: Status post CT-guided drainage of right abdominal abscess. Sample was sent for culture. Signed, Yvone Neu. Reyne Dumas, RPVI [facility] Radiology Electronically Signed   By: Gilmer Mor D.O.   On: 07/01/2019 16:49    Labs:  CBC: Recent Labs    06/29/19 0420 06/30/19 0431 07/01/19 0444 07/03/19 0500  WBC 10.2 8.1 8.7 9.1  HGB 11.1* 10.6* 10.1* 10.9*  HCT 35.1* 32.8* 30.8* 33.9*  PLT 158 189 201 245    COAGS: Recent Labs    06/26/19 0714 06/27/19 0411  INR 1.0 1.4*    BMP: Recent  Labs    06/28/19 0358 06/30/19 0431 07/01/19 0444 07/03/19 0500  NA 135 140 142 138  K 3.7 3.5 3.5 3.6  CL 104 104 108 102  CO2 22 25 26 26   GLUCOSE 108* 103* 103* 115*  BUN 16 19 19 9   CALCIUM 8.6* 8.6* 8.4* 8.6*  CREATININE 0.96 0.88 0.89 0.82  GFRNONAA >60 >60 >60 >60  GFRAA >60 >60 >60 >60    LIVER FUNCTION TESTS: Recent Labs    06/26/19 0714 07/01/19 0444  BILITOT 0.5 0.9  AST 19 16  ALT 17 17  ALKPHOS 43 40  PROT 7.7 6.5  ALBUMIN 3.7 2.8*    Assessment and Plan: Patient with history of perforated appendicitis with associated abscess; status post right lower quadrant drain placement on 3/24; afebrile, WBC normal, hemoglobin stable, creatinine normal, drain fluid cultures growing E. Coli; as outpatient recommend once daily irrigation of drain with 5 cc sterile normal saline, output recording and dressing changes as needed.  Patient given output recording card and IR drain clinic phone number.  He will be scheduled for follow-up in IR drain clinic in 7 to 10 days.   Electronically Signed: D. Rowe Robert, PA-C 07/03/2019, 10:50 AM   I spent a total of 15 minutes at the the patient's bedside AND on the patient's hospital floor or unit, greater than 50% of which was counseling/coordinating care for right lower abdominal abscess drain    Patient ID: Jeffrey Good, male   DOB: [birth year], 69 y.o.   MRN: [id number]

## 2019-07-03 NOTE — Progress Notes (Signed)
[facility] Progress Note     Subjective: Patient reports abdominal pain is improved. Denies nausea or vomiting. Had a BM this AM but it was small and hard.   Review of Systems  Constitutional: Negative for chills and fever.  Respiratory: Negative for shortness of breath.   Cardiovascular: Negative for chest pain.  Gastrointestinal: Positive for constipation (mild). Negative for abdominal pain, nausea and vomiting.  Genitourinary: Negative for dysuria, frequency and urgency.     Objective: Vital signs in last 24 hours: Temp:  [98.7 F (37.1 C)-100.1 F (37.8 C)] 99.3 F (37.4 C) (03/26 0558) Pulse Rate:  [79-89] 79 (03/26 0558) Resp:  [18] 18 (03/26 0558) BP: (132-148)/(85-93) 148/93 (03/26 0558) SpO2:  [98 %-99 %] 98 % (03/26 0558) Last BM Date: 07/01/19  Intake/Output from previous day: 03/25 0701 - 03/26 0700 In: 1783.5 [P.O.:1440; I.V.:223.8; IV Piggyback:119.7] Out: 7616 [Urine:1625; Drains:20] Intake/Output this shift: Total I/O In: -  Out: 180 [Urine:175; Drains:5]  PE: General: pleasant, WD, WN male who is laying in bed in NAD HEENT:Sclera are noninjected. PERRL. Ears and nose without any masses or lesions. Mouth is pink and moist Heart: regular, rate, and rhythm. Normal s1,s2. No obvious murmurs, gallops, or rubs noted. Palpable radial and pedal pulses bilaterally Lungs: CTAB, no wheezes, rhonchi, or rales noted. Respiratory effort nonlabored Abd: soft, mildly ttp at RLQ drain site, RLQ drain with murky fluid,ND+BS, no masses, hernias, or organomegaly MS: all 4 extremities are symmetrical with no cyanosis, clubbing, or edema. Skin: warm and dry with no masses, lesions, or rashes Neuro: Cranial nerves 2-12 grossly intact, sensation grossly intact throughout Psych: A&Ox3 with an appropriate affect.   Lab Results:  Recent Labs    07/01/19 0444 07/03/19 0500  WBC 8.7 9.1  HGB 10.1* 10.9*  HCT 30.8* 33.9*  PLT 201 245   BMET Recent  Labs    07/01/19 0444 07/03/19 0500  NA 142 138  K 3.5 3.6  CL 108 102  CO2 26 26  GLUCOSE 103* 115*  BUN 19 9  CREATININE 0.89 0.82  CALCIUM 8.4* 8.6*   PT/INR No results for input(s): LABPROT, INR in the last 72 hours. CMP     Component Value Date/Time   NA 138 07/03/2019 0500   K 3.6 07/03/2019 0500   CL 102 07/03/2019 0500   CO2 26 07/03/2019 0500   GLUCOSE 115 (H) 07/03/2019 0500   BUN 9 07/03/2019 0500   CREATININE 0.82 07/03/2019 0500   CREATININE 1.16 04/05/2016 1408   CALCIUM 8.6 (L) 07/03/2019 0500   PROT 6.5 07/01/2019 0444   PROT 7.3 11/16/2014 1635   ALBUMIN 2.8 (L) 07/01/2019 0444   AST 16 07/01/2019 0444   ALT 17 07/01/2019 0444   ALKPHOS 40 07/01/2019 0444   BILITOT 0.9 07/01/2019 0444   GFRNONAA >60 07/03/2019 0500   GFRAA >60 07/03/2019 0500   Lipase     Component Value Date/Time   LIPASE 31 06/26/2019 0714       Studies/Results: CT IMAGE GUIDED FLUID DRAIN BY CATHETER  Result Date: 07/01/2019 INDICATION: 69 year old male with a history of abdominal abscess EXAM: IMAGE GUIDED ABSCESS DRAIN PLACEMENT MEDICATIONS: The patient is currently admitted to the hospital and receiving intravenous antibiotics. The antibiotics were administered within an appropriate time frame prior to the initiation of the procedure. ANESTHESIA/SEDATION: Fentanyl 100 mcg IV; Versed 2.0 mg IV Moderate Sedation Time:  18 minutes The patient was continuously monitored during the procedure by the interventional radiology nurse under  my direct supervision. COMPLICATIONS: None PROCEDURE: Informed written consent was obtained from the patient after a thorough discussion of the procedural risks, benefits and alternatives. All questions were addressed. Maximal Sterile Barrier Technique was utilized including caps, mask, sterile gowns, sterile gloves, sterile drape, hand hygiene and skin antiseptic. A timeout was performed prior to the initiation of the procedure. Patient positioned  supine position on the CT gantry table. Scout CT was acquired for planning purposes. 1% lidocaine was used for local anesthesia. Using modified Seldinger technique, a 12 French drain was placed into the upper portion of abscess in the right abdomen, with the initial placement into the sub a Paddock region. Approximately 30 cc purulent fluid aspirated. Drain was attached to bulb suction and sutured in position. Sterile dressing was placed. Patient tolerated the procedure well and remained hemodynamically stable throughout. No complications were encountered and no significant blood loss. IMPRESSION: Status post CT-guided drainage of right abdominal abscess. Sample was sent for culture. Signed, Yvone Neu. Reyne Dumas, RPVI [facility] Radiology Electronically Signed   By: Gilmer Mor D.O.   On: 07/01/2019 16:49    Anti-infectives: Anti-infectives (From admission, onward)   Start     Dose/Rate Route Frequency Ordered Stop   07/03/19 1000  cefdinir (OMNICEF) capsule 300 mg     300 mg Oral Every 12 hours 07/03/19 0949 07/13/19 0959   06/26/19 1300  piperacillin-tazobactam (ZOSYN) IVPB 3.375 g  Status:  Discontinued     3.375 g 12.5 mL/hr over 240 Minutes Intravenous Every 8 hours 06/26/19 1155 07/03/19 0949   06/26/19 0845  cefTRIAXone (ROCEPHIN) 2 g in sodium chloride 0.9 % 100 mL IVPB     2 g 200 mL/hr over 30 Minutes Intravenous  Once 06/26/19 2130 06/26/19 0919   06/26/19 0845  metroNIDAZOLE (FLAGYL) IVPB 500 mg     500 mg 100 mL/hr over 60 Minutes Intravenous  Once 06/26/19 8657 06/26/19 1026       Assessment/Plan HTN HLD  Perforated appendicitis with abscess - CT 3/19 with perforated appendicitis but no organized fluid collection or abscess - CT 3/23 with 2 abscesses in RLQ  - WBC 8.7 yesterday, afebrile - IR drain placed yesterday, cx resulted with E.coli pan-sensitive - transition to PO abx today  - advance to soft diet   - mobilize -  home tomorrow with drain if tolerating soft diet   FEN: soft diet  VTE: SCDs,lovenox ID: rocephin/flagyl 3/19; Zosyn 3/19>>3/26; PO cefdinir 3/26>>  LOS: 7 days    [name] , [facility] 07/03/2019, 10:18 AM Please see Amion for pager number during day hours 7:00am-4:30pm

## 2019-07-04 LAB — CULTURE, BLOOD (ROUTINE X 2): Special Requests: ADEQUATE

## 2019-07-04 LAB — CBC
HCT: 32.6 % — ABNORMAL LOW (ref 39.0–52.0)
Hemoglobin: 10.5 g/dL — ABNORMAL LOW (ref 13.0–17.0)
MCH: 28.6 pg (ref 26.0–34.0)
MCHC: 32.2 g/dL (ref 30.0–36.0)
MCV: 88.8 fL (ref 80.0–100.0)
Platelets: 252 10*3/uL (ref 150–400)
RBC: 3.67 MIL/uL — ABNORMAL LOW (ref 4.22–5.81)
RDW: 13.9 % (ref 11.5–15.5)
WBC: 9.8 10*3/uL (ref 4.0–10.5)
nRBC: 0 % (ref 0.0–0.2)

## 2019-07-04 NOTE — Progress Notes (Signed)
Assessment unchanged. Pt verbalized understanding of dc instructions through teach back, and demonstration of how to flush and recharge drain. Supplies given to care for drain. Discharged via wc to front entrance accompanied by NT.

## 2019-07-04 NOTE — Discharge Summary (Signed)
Physician Discharge Summary  Patient ID: ZADE FALKNER MRN: [id number] DOB/AGE: 1950-07-19 69 y.o.  Admit date: 06/26/2019 Discharge date: 07/04/2019  Admission Diagnoses: perforated appendicitis  Discharge Diagnoses:  Principal Problem:   Perforated appendix   Discharged Condition: good  Hospital Course: Admitted with perforated appendicitis and ultimately required percutaneous drainage.   Discharge Exam: Blood pressure 123/75, pulse 75, temperature 98.9 F (37.2 C), temperature source Oral, resp. rate 18, height 5\' 10"  (1.778 m), weight 102.5 kg, SpO2 98 %. General appearance: alert and cooperative Resp: clear to auscultation bilaterally Cardio: regular rate and rhythm GI: soft, mildly tender around drain, output serous Neurologic: Grossly normal  Disposition: Discharge disposition: 01-Home or Self Care        Allergies as of 07/04/2019      Reactions   Pregabalin Nausea Only   Pt states he doesn't recall this allergy      Medication List    TAKE these medications   acetaminophen 325 MG tablet Commonly known as: TYLENOL Take 2 tablets (650 mg total) by mouth every 6 (six) hours as needed for mild pain (or temp > 100).   carvedilol 25 MG tablet Commonly known as: COREG Take 1 tablet by mouth twice daily What changed: when to take this   cefdinir 300 MG capsule Commonly known as: OMNICEF Take 1 capsule (300 mg total) by mouth every 12 (twelve) hours for 9 days.   docusate sodium 100 MG capsule Commonly known as: COLACE Take 1 capsule (100 mg total) by mouth daily as needed for mild constipation.   doxazosin 8 MG tablet Commonly known as: CARDURA Take 1 tablet (8 mg total) by mouth at bedtime.   gabapentin 300 MG capsule Commonly known as: NEURONTIN TAKE 1 CAPSULE BY MOUTH THREE TIMES A DAY What changed:   how much to take  how to take this  when to take this  additional instructions   HYDROcodone-acetaminophen 5-325 MG tablet Commonly  known as: NORCO/VICODIN Take one or two po daily as needed What changed:   how much to take  how to take this  when to take this  reasons to take this  additional instructions   lamoTRIgine 150 MG tablet Commonly known as: LAMICTAL TAKE 1 TABLET BY MOUTH TWICE A DAY   lisinopril 20 MG tablet Commonly known as: ZESTRIL Take 1 tablet by mouth twice daily What changed: when to take this   meloxicam 7.5 MG tablet Commonly known as: MOBIC Take 1 tablet (7.5 mg total) by mouth daily.   Normal Saline Flush 0.9 % Soln 10 mLs by Intracatheter route daily. Flush drain with 5 mL fluid daily   polyethylene glycol 17 g packet Commonly known as: MIRALAX / GLYCOLAX Take 17 g by mouth daily as needed for mild constipation.   sildenafil 100 MG tablet Commonly known as: VIAGRA TAKE ONE HALF TO ONE PILL AS NEEDED What changed: See the new instructions.   spironolactone 25 MG tablet Commonly known as: ALDACTONE TAKE 1 TABLET BY MOUTH ONCE DAILY . APPOINTMENT REQUIRED FOR FUTURE REFILLS What changed: See the new instructions.   ZyrTEC Allergy 10 MG Caps Generic drug: Cetirizine HCl Take 10 mg by mouth daily.      Follow-up Information    07/06/2019, MD Follow up on 07/21/2019.   Specialty: General Surgery Why: your appointment is at 2:10 PM.  Be at the office 30 minutes early for check-in.  Bring photo ID and insurance information with you. Contact information: [address]  [place] [address] [phone]        Donald Prose, MD Follow up.   Specialty: Family Medicine Why: Call and let them know you are in the hospital with a perforated appendix, and still have a drain in place.  Follow-up for medical issues. Contact information: [place], [address] [place] [address] [phone]        Nahser, Wonda Cheng, MD .   Specialty: Cardiology Contact information: [address] [place]. [address] [place] [address] [phone]            Signed: Clovis Riley 07/04/2019, 8:48 AM

## 2019-07-04 NOTE — Progress Notes (Signed)
Patient was instructed on flushing JP draining,  flushes, and charging JP bulb to patient and teach back demonstration was observed.  Pt. Verbalized understanding of teaching.

## 2019-07-06 ENCOUNTER — Telehealth: Payer: Self-pay | Admitting: Family Medicine

## 2019-07-06 ENCOUNTER — Other Ambulatory Visit: Payer: Self-pay | Admitting: Surgery

## 2019-07-06 ENCOUNTER — Telehealth: Payer: Self-pay | Admitting: Cardiovascular Disease

## 2019-07-06 DIAGNOSIS — K651 Peritoneal abscess: Secondary | ICD-10-CM

## 2019-07-06 LAB — AEROBIC/ANAEROBIC CULTURE W GRAM STAIN (SURGICAL/DEEP WOUND)

## 2019-07-06 MED ORDER — MELOXICAM 7.5 MG PO TABS: 7.5000 mg | ORAL_TABLET | Freq: Every day | ORAL | 5 refills | Status: DC

## 2019-07-06 NOTE — Telephone Encounter (Signed)
Pt is needing a refill on his meloxicam (MOBIC) 7.5 MG tablet sent in to the [facility] on [address].

## 2019-07-06 NOTE — Telephone Encounter (Signed)
Done

## 2019-07-06 NOTE — Telephone Encounter (Signed)
° °  Pt called and verify appt

## 2019-07-16 ENCOUNTER — Ambulatory Visit
Admission: RE | Admit: 2019-07-16 | Discharge: 2019-07-16 | Disposition: A | Discharge status: Home / Self Care | Payer: PRIVATE HEALTH INSURANCE | Source: Ambulatory Visit | Attending: Surgery | Admitting: Surgery

## 2019-07-16 ENCOUNTER — Other Ambulatory Visit: Payer: Self-pay | Admitting: Surgery

## 2019-07-16 ENCOUNTER — Ambulatory Visit
Admission: RE | Admit: 2019-07-16 | Discharge: 2019-07-16 | Disposition: A | Discharge status: Home / Self Care | Payer: PRIVATE HEALTH INSURANCE | Source: Ambulatory Visit | Attending: Radiology | Admitting: Radiology

## 2019-07-16 DIAGNOSIS — K651 Peritoneal abscess: Secondary | ICD-10-CM

## 2019-07-16 HISTORY — PX: IR RADIOLOGIST EVAL & MGMT: IMG5224

## 2019-07-16 MED ORDER — IOPAMIDOL (ISOVUE-300) INJECTION 61%
125.0000 mL | Freq: Once | INTRAVENOUS | Status: AC | PRN
  Administered 2019-07-16: 125 mL via INTRAVENOUS

## 2019-07-16 NOTE — Progress Notes (Signed)
Referring Physician(s): Allred,Darrell K  Chief Complaint: The patient is seen in follow up today s/p perforated appendicitis  History of present illness:  Mr. Bauernfeind is a 69 year old male with past medical history of HLD, HTN who presented to the [facility] ED 06/26/19 with RLQ pain and fever.  Patient found to have perforated appendicitis with extraluminal spillage but no initial organized fluid collection or abscess.  He failed to progress after several days of conservative management.  On 3/23 repeat CT imaging showed interval development of a well-defined abscess in the right lower quadrant. He underwent percutaneous drainage 07/01/19 with Dr. Loreta Ave.  He was subsequently discharged home 3 days later with PO antibiotics.  Mr. Karlen presents to IR drain clinic today for follow-up of his drain placement.  He has been doing better at home.  Denies new concerns.  Denies fevers, chills, abdominal pain, nausea, vomiting, diarrhea.  His drain is intact.  He has been flushing once daily. He has kept an excellent log of his output which shows 5-10 mL daily. He has completed antibiotics and has been able to advance his diet with tolerance.  He has surgical follow-up scheduled next week.   Past Medical History:  Diagnosis Date  . Erectile dysfunction   . Hyperlipidemia   . Hypertension     Past Surgical History:  Procedure Laterality Date  . IR RADIOLOGIST EVAL & MGMT  07/16/2019  . KNEE ARTHROSCOPY     1989 left    Allergies: Pregabalin  Medications: Prior to Admission medications   Medication Sig Start Date End Date Taking? Authorizing Provider  acetaminophen (TYLENOL) 325 MG tablet Take 2 tablets (650 mg total) by mouth every 6 (six) hours as needed for mild pain (or temp > 100). 07/03/19   Juliet Rude, PA-C  carvedilol (COREG) 25 MG tablet Take 1 tablet by mouth twice daily Patient taking differently: Take 25 mg by mouth in the morning and at bedtime.  06/03/19   Nahser, Deloris Ping, MD    Cetirizine HCl (ZYRTEC ALLERGY) 10 MG CAPS Take 10 mg by mouth daily.  11/15/08   [provider]  docusate sodium (COLACE) 100 MG capsule Take 1 capsule (100 mg total) by mouth daily as needed for mild constipation. 07/03/19   Juliet Rude, PA-C  doxazosin (CARDURA) 8 MG tablet Take 1 tablet (8 mg total) by mouth at bedtime. 06/03/19   Nahser, Deloris Ping, MD  gabapentin (NEURONTIN) 300 MG capsule TAKE 1 CAPSULE BY MOUTH THREE TIMES A DAY Patient taking differently: Take 300 mg by mouth 3 (three) times daily.  12/22/18   Lomax, Amy, NP  HYDROcodone-acetaminophen (NORCO/VICODIN) 5-325 MG tablet Take one or two po daily as needed Patient taking differently: Take 1-2 tablets by mouth daily as needed for moderate pain.  06/18/19   Sater, Pearletha Furl, MD  lamoTRIgine (LAMICTAL) 150 MG tablet TAKE 1 TABLET BY MOUTH TWICE A DAY Patient taking differently: Take 150 mg by mouth 2 (two) times daily.  04/28/19   Lomax, Amy, NP  lisinopril (ZESTRIL) 20 MG tablet Take 1 tablet by mouth twice daily Patient taking differently: Take 20 mg by mouth in the morning and at bedtime.  06/11/19   Nahser, Deloris Ping, MD  meloxicam (MOBIC) 7.5 MG tablet Take 1 tablet (7.5 mg total) by mouth daily. 07/06/19   Lomax, Amy, NP  polyethylene glycol (MIRALAX / GLYCOLAX) 17 g packet Take 17 g by mouth daily as needed for mild constipation. 07/03/19  Barkley Boards R, PA-C  sildenafil (VIAGRA) 100 MG tablet TAKE ONE HALF TO ONE PILL AS NEEDED Patient taking differently: Take 50-100 mg by mouth as needed for erectile dysfunction.  12/01/18   Lomax, Amy, NP  Sodium Chloride Flush (NORMAL SALINE FLUSH) 0.9 % SOLN 10 mLs by Intracatheter route daily. Flush drain with 5 mL fluid daily 07/03/19   Norm Parcel, PA-C  spironolactone (ALDACTONE) 25 MG tablet TAKE 1 TABLET BY MOUTH ONCE DAILY . APPOINTMENT REQUIRED FOR FUTURE REFILLS Patient taking differently: Take 25 mg by mouth daily.  07/21/18   Nahser, Wonda Cheng, MD  potassium chloride  (K-DUR,KLOR-CON) 10 MEQ tablet Take 1 tablet (10 mEq total) by mouth daily. 04/30/12 05/31/13  Nahser, Wonda Cheng, MD     Family History  Problem Relation Age of Onset  . Hypertension Mother   . Diabetes Mother     Social History   Socioeconomic History  . Marital status: Married    Spouse name: Not on file  . Number of children: Not on file  . Years of education: Not on file  . Highest education level: Not on file  Occupational History  . Not on file  Tobacco Use  . Smoking status: Never Smoker  . Smokeless tobacco: Never Used  Substance and Sexual Activity  . Alcohol use: Yes    Alcohol/week: 1.0 standard drinks    Types: 1 Cans of beer per week    Comment: very rare  . Drug use: No  . Sexual activity: Yes  Other Topics Concern  . Not on file  Social History Narrative  . Not on file   Social Determinants of Health   Financial Resource Strain:   . Difficulty of Paying Living Expenses:   Food Insecurity:   . Worried About Charity fundraiser in the Last Year:   . Arboriculturist in the Last Year:   Transportation Needs:   . Film/video editor (Medical):   Marland Kitchen Lack of Transportation (Non-Medical):   Physical Activity:   . Days of Exercise per Week:   . Minutes of Exercise per Session:   Stress:   . Feeling of Stress :   Social Connections:   . Frequency of Communication with Friends and Family:   . Frequency of Social Gatherings with Friends and Family:   . Attends Religious Services:   . Active Member of Clubs or Organizations:   . Attends Archivist Meetings:   Marland Kitchen Marital Status:      Vital Signs: BP 118/73   Pulse 72   Temp (!) 97.2 F (36.2 C)   SpO2 99%   Physical Exam  NAD, alert Abdomen: soft, non-tender. RLQ drain in place.  Insertion site c/d/i. Small amount of pink, cloudy output.  Output ranges from 5-10 mL/day.   Imaging: IR Radiologist Eval & Mgmt  Result Date: 07/16/2019 Please refer to notes tab for details about  interventional procedure. (Op Note)   Labs:  CBC: Recent Labs    06/30/19 0431 07/01/19 0444 07/03/19 0500 07/04/19 0503  WBC 8.1 8.7 9.1 9.8  HGB 10.6* 10.1* 10.9* 10.5*  HCT 32.8* 30.8* 33.9* 32.6*  PLT 189 201 245 252    COAGS: Recent Labs    06/26/19 0714 06/27/19 0411  INR 1.0 1.4*    BMP: Recent Labs    06/28/19 0358 06/30/19 0431 07/01/19 0444 07/03/19 0500  NA 135 140 142 138  K 3.7 3.5 3.5 3.6  CL 104 104  108 102  CO2 22 25 26 26   GLUCOSE 108* 103* 103* 115*  BUN 16 19 19 9   CALCIUM 8.6* 8.6* 8.4* 8.6*  CREATININE 0.96 0.88 0.89 0.82  GFRNONAA >60 >60 >60 >60  GFRAA >60 >60 >60 >60    LIVER FUNCTION TESTS: Recent Labs    06/26/19 0714 07/01/19 0444  BILITOT 0.5 0.9  AST 19 16  ALT 17 17  ALKPHOS 43 40  PROT 7.7 6.5  ALBUMIN 3.7 2.8*    Assessment: Perforated appendicitis Mr. Manzo is a 69 year old male with history of HTN, HLD who presented to [facility] ED with perforated appendicitis and ileus. Initially he was treated conservatively with NGT and antibiotics, however he continued to complain of abdominal pain.  Subsequent imaging showed the development of an intra-abdominal fluid collection.  He underwent drain placement 07/01/19.   Cultures from this drainage grew E. Coli, Strep constellatus, and Bacteroides thetaiotaomicron.  He has completed course of cefdinir. He returns to IR drain clinic today for routine follow-up of his drain.  He reports improvement in his symptoms.  Ultimate plans re: appendectomy vs. Continue conservative management are unknown at this time.  He does have surgical follow-up next week.  CT Abdomen Pelvis and drain injection performed today and reviewed by Dr[facility].  His abscess has resolved, however a fistulous connection is present as demonstrated on injection.  Drain to remain in place for now.  Transitioned to gravity bag.  Discontinue flushes.  Patient to follow-up in drain clinic in 2 weeks for repeat drain  injection. No CT imaging is needed at that time. Patient verbalizes understanding of information provided.   Signed: 07/03/19, PA 07/16/2019, 2:22 PM   Please refer to Dr. Hoyt Koch attestation of this note for management and plan.

## 2019-07-20 ENCOUNTER — Other Ambulatory Visit: Payer: Self-pay | Admitting: Cardiovascular Disease

## 2019-07-21 ENCOUNTER — Other Ambulatory Visit: Payer: Self-pay

## 2019-07-21 MED ORDER — CARVEDILOL 25 MG PO TABS: 25.0000 mg | ORAL_TABLET | Freq: Two times a day (BID) | ORAL | 0 refills | Status: DC

## 2019-07-22 ENCOUNTER — Other Ambulatory Visit: Payer: Self-pay

## 2019-07-22 ENCOUNTER — Ambulatory Visit: Payer: PRIVATE HEALTH INSURANCE | Admitting: Family Medicine

## 2019-07-22 ENCOUNTER — Encounter: Payer: Self-pay | Admitting: Family Medicine

## 2019-07-22 VITALS — BP 120/75 | HR 80 | Temp 98.6°F | Ht 70.0 in | Wt 212.4 lb

## 2019-07-22 DIAGNOSIS — G894 Chronic pain syndrome: Secondary | ICD-10-CM

## 2019-07-22 DIAGNOSIS — G629 Polyneuropathy, unspecified: Secondary | ICD-10-CM | POA: Diagnosis not present

## 2019-07-22 MED ORDER — HYDROCODONE-ACETAMINOPHEN 5-325 MG PO TABS: 1.0000 | ORAL_TABLET | Freq: Every day | ORAL | 0 refills | Status: DC | PRN

## 2019-07-22 NOTE — Progress Notes (Signed)
PATIENT: Jeffrey Good DOB: 1950/07/07  REASON FOR VISIT: follow up HISTORY FROM: patient  Chief Complaint  Patient presents with  . Follow-up    Follow up for lumbosacral radiculopathy room pt alone     HISTORY OF PRESENT ILLNESS: Today 07/22/19 Jeffrey Good is a 69 y.o. male here today for follow up for dysesthesias and back pain. He continues lamotrigine 141m twice daily and gabapentin 1 in the morning and 2 at night. He also takes hydrocodone 5-3233m1-2 times daily. Last refill 06/18/2019. He has tried  He continues to have "a little heat" in his feet. He feels that his back pain is actually better. He is waling every morning for about a mile. He tries to walk again in the evenings. He has been out of work due to perforated appendix.   He was hospitalized in 06/2019 for sepsis due to perforated appendix requiring percutaneous drainage. He is being followed by general surgery for consideration of an appendectomy. He was seen yesterday and will return for evaluation on 4/21.   HISTORY: (copied from my note on 11/19/2018)  Jeffrey Good a [age].o. male here today for follow up for dysesthesias and back pain. He continues lamotrigine 15049mwice daily and gabapentin 300m22mD. He is prescribed 300mg42mee times daily but hasn't increased dose. He uses hydrocodone as needed for pain. Last refill 11/10/2018 for 45 tablets. He states that he usually takes 1 everyday and sometimes he takes two tablets. He feels that back pain and neuropathy are stable. He is walking about 2 miles daily for exercise. 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    HISTORY: (copied from Dr SaterGarth Bigness on 05/20/2018)  Jeffrey Good 66 yo74an with burning dysesthesias and back pain.  Update 05/20/2018: He is having numbness and tingling dysesthesia in his feet. Lamotrigine 150 mg helped incompletely. More recently, gabapentin was started and she is on 300 mg po tid.  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 The leg pain is fairly symmetric. Pain is worse at night and when he first wakes up and sometimes after a long day of driving.. He Marland Kitchenometimes needs a hydrocodone when pain is bothering him more.   He denies weakness in his legs. He does not note bladder changes but has ED, helped by Viagra. 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    Update 11/13/2017: He has numbness in the feet and mild dysesthesia. Lamotrigine 150 mg twice daily has helped a lot. He tolerates it well. He denies any significant weakness. He feels the balance and gait are doing well. He walks up to 3 miles a day and also uses an exercise bike in bad weather. The foot pain is usually worse at night and he takes hydrocodone with benefit. 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 Viagra has been helpful. 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  He has some low back pain and feels it is doing about the same. Moving around helps the back pain when laying down worsens it. He notes it for at night. He is working as a truckAdministratorneeds to take some breaks to move around.  Update 05/14/2017:  He feels his dysesthetic nerve pain is doing well on lamotrigine 150 mg po bid. He tolerates it well. He notes no worsening numbness. He denies any change in strength. Balance and gait are good. He walks 3 miles and/or rides exercise bike x 45 minutes daily. He takes hydrocodone many nights with benefit as his foot pain is worse when he lays down  His LBP is about the same . It bothers him more at night or when laying down. He does better with movements. Sitting is not as bad. 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   He also has had ED since the neuropathy started. Viagra 50-100 mg usually helps a lot.   From 11/07/2016: Dysesthetic Pain: He has a burning pain in both feet. He feels that this is doing better on lamotrigine 150 mg twice a day that on a lower dose. There are still some days that hurt more and hydrocodone will help. Pain is worse on the bottom of the foot than the top of the foot. He does not note any weakness. He does not feel that this has worsened. He feels he walks well. He does not have any weakness. Balance is fine. He has not had any bladder or bowel changes. Etiology of the neuropathy is unknown. Lab work have been normal and he does not have diabetes or other systemic illnesses .   LBP: LBP is doing better. 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 MRI shows degenerative changes at L5-S1. Nerve conduction study / EMG showed a mild S1 chronic radiculopathy.  ED: He reports erectile dysfunction that is helped by Viagra.   History of dysesthesia: Early 2017, he had the onset of a burning sensation in his feet. He did not note any change in the color of most of the toes though the tip of the big toe did seem to be a mildly different color. Sometimes, he gets a sensation of swelling in the big toes. Initially, he was tried on Lyrica but had stomach upset and stopped. Then, he was tried on gabapentin. 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 He does not think he was on either of these medications long enough to know where the not he got a benefit. Lamotrigine combined with hydrocodone has helped best..   Studies : ESR, cryoglobulins, SPEP/IEF and ANA were normal. An NCV/EMG was more consistent with mild S1 chronic radiculopathy. There was not evidence of a large fiber polyneuropathy, though a  small fiber polyneuropathy is still possible.    REVIEW OF SYSTEMS: Out of a complete 14 system review of symptoms, the patient complains only of the following symptoms, numbness, burning, back pain and all other reviewed systems are negative.  ALLERGIES: Allergies  Allergen Reactions  . Pregabalin Nausea Only    Pt states he doesn't recall this allergy    HOME MEDICATIONS: Outpatient Medications Prior to Visit  Medication Sig Dispense Refill  . acetaminophen (TYLENOL) 325 MG tablet Take 2 tablets (650 mg total) by mouth every 6 (six) hours as needed for mild pain (or temp > 100).    . carvedilol (COREG) 25 MG tablet Take 1 tablet (25 mg total) by mouth 2 (two) times daily. Please keep upcoming appt with Dr. Acie Fredrickson in April for future refills. Thank you 60 tablet 0  . Cetirizine  HCl (ZYRTEC ALLERGY) 10 MG CAPS Take 10 mg by mouth daily.     Marland Kitchen docusate sodium (COLACE) 100 MG capsule Take 1 capsule (100 mg total) by mouth daily as needed for mild constipation.    Marland Kitchen doxazosin (CARDURA) 8 MG tablet Take 1 tablet (8 mg total) by mouth at bedtime. 90 tablet 0  . gabapentin (NEURONTIN) 300 MG capsule TAKE 1 CAPSULE BY MOUTH THREE TIMES A DAY (Patient taking differently: Take 300 mg by mouth 3 (three) times daily. ) 270 capsule 2  . lamoTRIgine (LAMICTAL) 150 MG tablet TAKE 1 TABLET BY MOUTH TWICE A DAY (Patient taking differently: Take 150 mg by mouth 2 (two) times daily. ) 180 tablet 3  . lisinopril (ZESTRIL) 20 MG tablet Take 1 tablet by mouth twice daily (Patient taking differently: Take 20 mg by mouth in the morning and at bedtime. ) 60 tablet 1  . meloxicam (MOBIC) 7.5 MG tablet Take 1 tablet (7.5 mg total) by mouth daily. 30 tablet 5  . polyethylene glycol (MIRALAX / GLYCOLAX) 17 g packet Take 17 g by mouth daily as needed for mild constipation.    . sildenafil (VIAGRA) 100 MG tablet TAKE ONE HALF TO ONE PILL AS NEEDED (Patient taking differently: Take 50-100 mg by mouth as needed for  erectile dysfunction. ) 10 tablet 11  . Sodium Chloride Flush (NORMAL SALINE FLUSH) 0.9 % SOLN 10 mLs by Intracatheter route daily. Flush drain with 5 mL fluid daily 50 mL 0  . spironolactone (ALDACTONE) 25 MG tablet TAKE 1 TABLET BY MOUTH ONCE DAILY . APPOINTMENT REQUIRED FOR FUTURE REFILLS (Patient taking differently: Take 25 mg by mouth daily. ) 90 tablet 3  . HYDROcodone-acetaminophen (NORCO/VICODIN) 5-325 MG tablet Take one or two po daily as needed (Patient taking differently: Take 1-2 tablets by mouth daily as needed for moderate pain. ) 45 tablet 0   No facility-administered medications prior to visit.    PAST MEDICAL HISTORY: Past Medical History:  Diagnosis Date  . Erectile dysfunction   . Hyperlipidemia   . Hypertension     PAST SURGICAL HISTORY: Past Surgical History:  Procedure Laterality Date  . IR RADIOLOGIST EVAL & MGMT  07/16/2019  . KNEE ARTHROSCOPY     1989 left    FAMILY HISTORY: Family History  Problem Relation Age of Onset  . Hypertension Mother   . Diabetes Mother     SOCIAL HISTORY: Social History   Socioeconomic History  . Marital status: Married    Spouse name: Not on file  . Number of children: Not on file  . Years of education: Not on file  . Highest education level: Not on file  Occupational History  . Not on file  Tobacco Use  . Smoking status: Never Smoker  . Smokeless tobacco: Never Used  Substance and Sexual Activity  . Alcohol use: Yes    Alcohol/week: 1.0 standard drinks    Types: 1 Cans of beer per week    Comment: very rare  . Drug use: No  . Sexual activity: Yes  Other Topics Concern  . Not on file  Social History Narrative  . Not on file   Social Determinants of Health   Financial Resource Strain:   . Difficulty of Paying Living Expenses:   Food Insecurity:   . Worried About Charity fundraiser in the Last Year:   . Arboriculturist in the Last Year:   Transportation Needs:   . Film/video editor (Medical):   Marland Kitchen  Lack of Transportation (Non-Medical):   Physical Activity:   . Days of Exercise per Week:   . Minutes of Exercise per Session:   Stress:   . Feeling of Stress :   Social Connections:   . Frequency of Communication with Friends and Family:   . Frequency of Social Gatherings with Friends and Family:   . Attends Religious Services:   . Active Member of Clubs or Organizations:   . Attends Archivist Meetings:   Marland Kitchen Marital Status:   Intimate Partner Violence:   . Fear of Current or Ex-Partner:   . Emotionally Abused:   Marland Kitchen Physically Abused:   . Sexually Abused:       PHYSICAL EXAM  Vitals:   07/22/19 1428  BP: 120/75  Pulse: 80  Temp: 98.6 F (37 C)  Weight: 212 lb 6.4 oz (96.3 kg)  Height: 5' 10"  (1.778 m)   Body mass index is 30.48 kg/m.  Generalized: Well developed, in no acute distress  Cardiology: normal rate and rhythm, no murmur noted Respiratory: clear to auscultation bilaterally  Neurological examination  Mentation: Alert oriented to time, place, history taking. Follows all commands speech and language fluent Cranial nerve II-XII: Pupils were equal round reactive to light. Extraocular movements were full, visual field were full on confrontational test. Facial sensation and strength were normal. Uvula tongue midline. Head turning and shoulder shrug  were normal and symmetric. Motor: The motor testing reveals 5 over 5 strength of all 4 extremities. Good symmetric motor tone is noted throughout.  Sensory: Sensory testing is intact to soft touch on all 4 extremities. No evidence of extinction is noted.  Coordination: Cerebellar testing reveals good finger-nose-finger and heel-to-shin bilaterally.  Gait and station: Gait is normal. Perc drainage tubing intact, no signs of infection noted. Serosanguinous drainage noted in tubing, bad empty.   DIAGNOSTIC DATA (LABS, IMAGING, TESTING) - I reviewed patient records, labs, notes, testing and imaging myself where  available.  No flowsheet data found.   Lab Results  Component Value Date   WBC 9.8 07/04/2019   HGB 10.5 (L) 07/04/2019   HCT 32.6 (L) 07/04/2019   MCV 88.8 07/04/2019   PLT 252 07/04/2019      Component Value Date/Time   NA 138 07/03/2019 0500   K 3.6 07/03/2019 0500   CL 102 07/03/2019 0500   CO2 26 07/03/2019 0500   GLUCOSE 115 (H) 07/03/2019 0500   BUN 9 07/03/2019 0500   CREATININE 0.82 07/03/2019 0500   CREATININE 1.16 04/05/2016 1408   CALCIUM 8.6 (L) 07/03/2019 0500   PROT 6.5 07/01/2019 0444   PROT 7.3 11/16/2014 1635   ALBUMIN 2.8 (L) 07/01/2019 0444   AST 16 07/01/2019 0444   ALT 17 07/01/2019 0444   ALKPHOS 40 07/01/2019 0444   BILITOT 0.9 07/01/2019 0444   GFRNONAA >60 07/03/2019 0500   GFRAA >60 07/03/2019 0500   Lab Results  Component Value Date   CHOL 126 09/01/2014   HDL 34.00 (L) 09/01/2014   LDLCALC 71 09/01/2014   TRIG 103.0 09/01/2014   CHOLHDL 4 09/01/2014   No results found for: HGBA1C No results found for: VITAMINB12 No results found for: TSH     ASSESSMENT AND PLAN 69 y.o. year old male  has a past medical history of Erectile dysfunction, Hyperlipidemia, and Hypertension. here with     ICD-10-CM   1. 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 Chronic pain syndrome  G89.4     Mr Springs is doing  well today.  We will continue lamotrigine, gabapentin and hydrocodone as prescribed.  I have reviewed PMP aware and appropriate refills have been requested.  I have sent in 1 month supply as requested today.  He was advised to use hydrocodone sparingly.  He will continue close follow-up with primary care and general surgery.  Percutaneous drainage system today is intact.  No signs of infection noted.  We have discussed signs and symptoms of infection and when to seek emergency medical attention.  He will stay well-hydrated.  I have encouraged him to stay as active as possible.  He will follow-up with Dr. Ladonna Snide in 6 months, sooner if needed.  He verbalizes  understanding and agreement with this plan.   No orders of the defined types were placed in this encounter.    Meds ordered this encounter  Medications  . HYDROcodone-acetaminophen (NORCO/VICODIN) 5-325 MG tablet    Sig: Take 1-2 tablets by mouth daily as needed for moderate pain.    Dispense:  45 tablet    Refill:  0    #45/ 30 days. Drug registry checked 05-14-19 $ 45    Order Specific Question:   Supervising Provider    Answer:   Melvenia Beam [[id number]]      I spent 25 minutes with the patient. 50% of this time was spent counseling and educating patient on plan of care and medications.    Debbora Presto, FNP-C 07/22/2019, 2:59 PM [facility] [address], [place], [place] [address] [phone]

## 2019-07-22 NOTE — Patient Instructions (Signed)
Continue lamotrigine, gabapentin and hydrocodone as prescribed   Follow up closely with PCP and general surgery. Monitor for signs of infection as discussed in office  Follow up with Dr Epimenio Foot in 6 months   Neuropathic Pain Neuropathic pain is pain caused by damage to the nerves that are responsible for certain sensations in your body (sensory nerves). The pain can be caused by:  Damage to the sensory nerves that send signals to your spinal cord and brain (peripheral nervous system).  Damage to the sensory nerves in your brain or spinal cord (central nervous system). Neuropathic pain can make you more sensitive to pain. Even a minor sensation can feel very painful. This is usually a long-term condition that can be difficult to treat. The type of pain differs from person to person. It may:  Start suddenly (acute), or it may develop slowly and last for a long time (chronic).  Come and go as damaged nerves heal, or it may stay at the same level for years.  Cause emotional distress, loss of sleep, and a lower quality of life. What are the causes? The most common cause of this condition is diabetes. Many other diseases and conditions can also cause neuropathic pain. Causes of neuropathic pain can be classified as:  Toxic. This is caused by medicines and chemicals. The most common cause of toxic neuropathic pain is damage from cancer treatments (chemotherapy).  Metabolic. This can be caused by: ? Diabetes. This is the most common disease that damages the nerves. ? Lack of vitamin B from long-term alcohol abuse.  Traumatic. Any injury that cuts, crushes, or stretches a nerve can cause damage and pain. A common example is feeling pain after losing an arm or leg (phantom limb pain).  Compression-related. If a sensory nerve gets trapped or compressed for a long period of time, the blood supply to the nerve can be cut off.  Vascular. Many blood vessel diseases can cause neuropathic pain by  decreasing blood supply and oxygen to nerves.  Autoimmune. This type of pain results from diseases in which the body's defense system (immune system) mistakenly attacks sensory nerves. Examples of autoimmune diseases that can cause neuropathic pain include lupus and multiple sclerosis.  Infectious. Many types of viral infections can damage sensory nerves and cause pain. Shingles infection is a common cause of this type of pain.  Inherited. Neuropathic pain can be a symptom of many diseases that are passed down through families (genetic). What increases the risk? You are more likely to develop this condition if:  You have diabetes.  You smoke.  You drink too much alcohol.  You are taking certain medicines, including medicines that kill cancer cells (chemotherapy) or that treat immune system disorders. What are the signs or symptoms? The main symptom is pain. Neuropathic pain is often described as:  Burning.  Shock-like.  Stinging.  Hot or cold.  Itching. How is this diagnosed? No single test can diagnose neuropathic pain. It is diagnosed based on:  Physical exam and your symptoms. Your health care provider will ask you about your pain. You may be asked to use a pain scale to describe how bad your pain is.  Tests. These may be done to see if you have a high sensitivity to pain and to help find the cause and location of any sensory nerve damage. They include: ? Nerve conduction studies to test how well nerve signals travel through your sensory nerves (electrodiagnostic testing). ? Stimulating your sensory nerves through electrodes on  your skin and measuring the response in your spinal cord and brain (somatosensory evoked potential).  Imaging studies, such as: ? X-rays. ? CT scan. ? MRI. How is this treated? Treatment for neuropathic pain may change over time. You may need to try different treatment options or a combination of treatments. Some options include:  Treating the  underlying cause of the neuropathy, such as diabetes, kidney disease, or vitamin deficiencies.  Stopping medicines that can cause neuropathy, such as chemotherapy.  Medicine to relieve pain. Medicines may include: ? Prescription or over-the-counter pain medicine. ? Anti-seizure medicine. ? Antidepressant medicines. ? Pain-relieving patches that are applied to painful areas of skin. ? A medicine to numb the area (local anesthetic), which can be injected as a nerve block.  Transcutaneous nerve stimulation. This uses electrical currents to block painful nerve signals. The treatment is painless.  Alternative treatments, such as: ? Acupuncture. ? Meditation. ? Massage. ? Physical therapy. ? Pain management programs. ? Counseling. Follow these instructions at home: Medicines   Take over-the-counter and prescription medicines only as told by your health care provider.  Do not drive or use heavy machinery while taking prescription pain medicine.  If you are taking prescription pain medicine, take actions to prevent or treat constipation. Your health care provider may recommend that you: ? Drink enough fluid to keep your urine pale yellow. ? Eat foods that are high in fiber, such as fresh fruits and vegetables, whole grains, and beans. ? Limit foods that are high in fat and processed sugars, such as fried or sweet foods. ? Take an over-the-counter or prescription medicine for constipation. Lifestyle   Have a good support system at home.  Consider joining a chronic pain support group.  Do not use any products that contain nicotine or tobacco, such as cigarettes and e-cigarettes. If you need help quitting, ask your health care provider.  Do not drink alcohol. General instructions  Learn as much as you can about your condition.  Work closely with all your health care providers to find the treatment plan that works best for you.  Ask your health care provider what activities are  safe for you.  Keep all follow-up visits as told by your health care provider. This is important. Contact a health care provider if:  Your pain treatments are not working.  You are having side effects from your medicines.  You are struggling with tiredness (fatigue), mood changes, depression, or anxiety. Summary  Neuropathic pain is pain caused by damage to the nerves that are responsible for certain sensations in your body (sensory nerves).  Neuropathic pain may come and go as damaged nerves heal, or it may stay at the same level for years.  Neuropathic pain is usually a long-term condition that can be difficult to treat. Consider joining a chronic pain support group. This information is not intended to replace advice given to you by your health care provider. Make sure you discuss any questions you have with your health care provider. Document Revised: 07/17/2018 Document Reviewed: 04/12/2017 Elsevier Patient Education  [employer].    Chronic Back Pain When back pain lasts longer than 3 months, it is called chronic back pain. Pain may get worse at certain times (flare-ups). There are things you can do at home to manage your pain. Follow these instructions at home: Activity      Avoid bending and other activities that make pain worse.  When standing: ? Keep your upper back and neck straight. ? Keep  your shoulders pulled back. ? Avoid slouching.  When sitting: ? Keep your back straight. ? Relax your shoulders. Do not round your shoulders or pull them backward.  Do not sit or stand in one place for long periods of time.  Take short rest breaks during the day. Lying down or standing is usually better than sitting. Resting can help relieve pain.  When sitting or lying down for a long time, do some mild activity or stretching. This will help to prevent stiffness and pain.  Get regular exercise. Ask your doctor what activities are safe for you.  Do not lift  anything that is heavier than 10 lb (4.5 kg). To prevent injury when you lift things: ? Bend your knees. ? Keep the weight close to your body. ? Avoid twisting. Managing pain  If told, put ice on the painful area. Your doctor may tell you to use ice for 24-48 hours after a flare-up starts. ? Put ice in a plastic bag. ? Place a towel between your skin and the bag. ? Leave the ice on for 20 minutes, 2-3 times a day.  If told, put heat on the painful area as often as told by your doctor. Use the heat source that your doctor recommends, such as a moist heat pack or a heating pad. ? Place a towel between your skin and the heat source. ? Leave the heat on for 20-30 minutes. ? Remove the heat if your skin turns bright red. This is especially important if you are unable to feel pain, heat, or cold. You may have a greater risk of getting burned.  Soak in a warm bath. This can help relieve pain.  Take over-the-counter and prescription medicines only as told by your doctor. General instructions  Sleep on a firm mattress. Try lying on your side with your knees slightly bent. If you lie on your back, put a pillow under your knees.  Keep all follow-up visits as told by your doctor. This is important. Contact a doctor if:  You have pain that does not get better with rest or medicine. Get help right away if:  One or both of your arms or legs feel weak.  One or both of your arms or legs lose feeling (numbness).  You have trouble controlling when you poop (bowel movement) or pee (urinate).  You feel sick to your stomach (nauseous).  You throw up (vomit).  You have belly (abdominal) pain.  You have shortness of breath.  You pass out (faint). Summary  When back pain lasts longer than 3 months, it is called chronic back pain.  Pain may get worse at certain times (flare-ups).  Use ice and heat as told by your doctor. Your doctor may tell you to use ice after flare-ups. This information  is not intended to replace advice given to you by your health care provider. Make sure you discuss any questions you have with your health care provider. Document Revised: 07/17/2018 Document Reviewed: 11/08/2016 Elsevier Patient Education  2020 [employer].

## 2019-07-22 NOTE — Progress Notes (Signed)
I have read the note, and I agree with the clinical assessment and plan.  Dquan Cortopassi A. Margret Moat, MD, PhD, FAAN Certified in Neurology, Clinical Neurophysiology, Sleep Medicine, Pain Medicine and Neuroimaging  [facility] [address], [address] , [place] [address] [phone]  

## 2019-07-23 ENCOUNTER — Ambulatory Visit: Payer: PRIVATE HEALTH INSURANCE | Admitting: Cardiovascular Disease

## 2019-07-23 ENCOUNTER — Encounter: Payer: Self-pay | Admitting: Cardiovascular Disease

## 2019-07-23 ENCOUNTER — Other Ambulatory Visit: Payer: Self-pay

## 2019-07-23 VITALS — BP 108/66 | HR 74 | Ht 70.0 in | Wt 211.5 lb

## 2019-07-23 DIAGNOSIS — I10 Essential (primary) hypertension: Secondary | ICD-10-CM

## 2019-07-23 MED ORDER — SPIRONOLACTONE 25 MG PO TABS: ORAL_TABLET | ORAL | 3 refills | Status: DC

## 2019-07-23 MED ORDER — LISINOPRIL 20 MG PO TABS: 20.0000 mg | ORAL_TABLET | Freq: Two times a day (BID) | ORAL | 3 refills | Status: DC

## 2019-07-23 MED ORDER — DOXAZOSIN MESYLATE ER 4 MG PO TB24: 4.0000 mg | ORAL_TABLET | Freq: Every day | ORAL | 11 refills | Status: DC

## 2019-07-23 MED ORDER — CARVEDILOL 25 MG PO TABS: 25.0000 mg | ORAL_TABLET | Freq: Two times a day (BID) | ORAL | 3 refills | Status: DC

## 2019-07-23 NOTE — Patient Instructions (Signed)
Medication Instructions:  Your physician has recommended you make the following change in your medication:  DECREASE Doxazosin (Cardura) to 4 mg once daily **Refills of your cardiac medications have been sent to your pharmacy  *If you need a refill on your cardiac medications before your next appointment, please call your pharmacy*   Lab Work: None Ordered If you have labs (blood work) drawn today and your tests are completely normal, you will receive your results only by: Marland Kitchen MyChart Message (if you have MyChart) OR . A paper copy in the mail If you have any lab test that is abnormal or we need to change your treatment, we will call you to review the results.   Testing/Procedures: None Ordered   Follow-Up: At [facility], you and your health needs are our priority.  As part of our continuing mission to provide you with exceptional heart care, we have created designated Provider Care Teams.  These Care Teams include your primary Cardiologist (physician) and Advanced Practice Providers (APPs -  Physician Assistants and Nurse Practitioners) who all work together to provide you with the care you need, when you need it.  We recommend signing up for the patient portal called "MyChart".  Sign up information is provided on this After Visit Summary.  MyChart is used to connect with patients for Virtual Visits (Telemedicine).  Patients are able to view lab/test results, encounter notes, upcoming appointments, etc.  Non-urgent messages can be sent to your provider as well.   To learn more about what you can do with MyChart, go to [email or web address].    Your next appointment:   3 month(s) on Monday July 19 at 8:00 am  The format for your next appointment:   In Person  Provider:   Kristeen Miss, MD
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
## 2019-07-29 ENCOUNTER — Ambulatory Visit
Admission: RE | Admit: 2019-07-29 | Discharge: 2019-07-29 | Disposition: A | Discharge status: Home / Self Care | Payer: PRIVATE HEALTH INSURANCE | Source: Ambulatory Visit | Attending: Surgery | Admitting: Surgery

## 2019-07-29 ENCOUNTER — Encounter: Payer: Self-pay | Admitting: Radiology

## 2019-07-29 DIAGNOSIS — K651 Peritoneal abscess: Secondary | ICD-10-CM

## 2019-07-29 HISTORY — PX: IR RADIOLOGIST EVAL & MGMT: IMG5224

## 2019-07-29 NOTE — Progress Notes (Signed)
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 Erectile dysfunction   . Hyperlipidemia   . Hypertension     Past Surgical History:  Procedure Laterality Date  . IR RADIOLOGIST EVAL & MGMT  07/16/2019  . IR RADIOLOGIST EVAL & MGMT  07/29/2019  . KNEE ARTHROSCOPY     1989 left    Allergies: Pregabalin  Medications: Prior to Admission medications   Medication Sig Start Date End Date Taking? Authorizing Provider  acetaminophen (TYLENOL) 325 MG tablet Take 2 tablets (650 mg total) by mouth every 6 (six) hours as needed for mild pain (or temp > 100). 07/03/19   Norm Parcel, PA-C  carvedilol (COREG) 25 MG tablet Take 1 tablet (25 mg total) by mouth 2 (two) times daily. 07/23/19   Nahser, Wonda Cheng, MD  Cetirizine HCl (ZYRTEC ALLERGY) 10 MG CAPS Take 10 mg by mouth daily.  11/15/08   [provider]  docusate sodium (COLACE) 100 MG capsule Take 1 capsule (100 mg total) by mouth daily as needed for mild constipation. 07/03/19   Norm Parcel, PA-C  doxazosin (CARDURA XL) 4 MG 24 hr tablet Take 1 tablet (4 mg total) by mouth daily with breakfast. 07/23/19   Nahser, Wonda Cheng, MD  gabapentin (NEURONTIN) 300 MG capsule TAKE 1 CAPSULE BY MOUTH THREE TIMES A DAY 12/22/18   Lomax, Amy, NP  HYDROcodone-acetaminophen (NORCO/VICODIN) 5-325 MG tablet Take 1-2 tablets by mouth daily as needed for moderate pain. 07/22/19   Lomax, Amy, NP  lamoTRIgine (LAMICTAL) 150 MG tablet TAKE 1 TABLET  BY MOUTH TWICE A DAY 04/28/19   Lomax, Amy, NP  lisinopril (ZESTRIL) 20 MG tablet Take 1 tablet (20 mg total) by mouth 2 (two) times daily. 07/23/19   Nahser, Wonda Cheng, MD  meloxicam (MOBIC) 7.5 MG tablet Take 1 tablet (7.5 mg total) by mouth daily. 07/06/19   Lomax, Amy, NP  polyethylene glycol (MIRALAX / GLYCOLAX) 17 g packet Take 17 g by mouth daily as needed for mild constipation. 07/03/19   Norm Parcel, PA-C  sildenafil (VIAGRA) 100 MG tablet TAKE ONE HALF TO ONE PILL AS NEEDED 12/01/18   Lomax, Amy, NP  Sodium Chloride Flush (NORMAL SALINE FLUSH) 0.9 % SOLN 10 mLs by Intracatheter route daily. Flush drain with 5 mL fluid daily 07/03/19   Norm Parcel, PA-C  spironolactone (ALDACTONE) 25 MG tablet TAKE 1 TABLET BY MOUTH ONCE DAILY 07/23/19   Nahser, Wonda Cheng, MD  potassium chloride (K-DUR,KLOR-CON) 10 MEQ tablet Take 1 tablet (10 mEq total) by mouth daily. 04/30/12 07/23/19  Nahser, Wonda Cheng, MD     Family History  Problem Relation Age of Onset  . Hypertension Mother   . Diabetes Mother     Social History   Socioeconomic History  . Marital status: Married    Spouse name: Not on file  . Number of children: Not on file  . Years of education: Not on file  . Highest education level: Not on file  Occupational History  . Not on  file  Tobacco Use  . Smoking status: Never Smoker  . Smokeless tobacco: Never Used  Substance and Sexual Activity  . Alcohol use: Yes    Alcohol/week: 1.0 standard drinks    Types: 1 Cans of beer per week    Comment: very rare  . Drug use: No  . Sexual activity: Yes  Other Topics Concern  . Not on file  Social History Narrative  . Not on file   Social Determinants of Health   Financial Resource Strain:   . Difficulty of Paying Living Expenses:   Food Insecurity:   . Worried About Programme researcher, broadcasting/film/video in the Last Year:   . Barista in the Last Year:   Transportation Needs:   . Freight forwarder (Medical):   Marland Kitchen Lack of Transportation  (Non-Medical):   Physical Activity:   . Days of Exercise per Week:   . Minutes of Exercise per Session:   Stress:   . Feeling of Stress :   Social Connections:   . Frequency of Communication with Friends and Family:   . Frequency of Social Gatherings with Friends and Family:   . Attends Religious Services:   . Active Member of Clubs or Organizations:   . Attends Banker Meetings:   Marland Kitchen Marital Status:      Vital Signs: BP 105/61   Pulse (!) 103   Temp 98 F (36.7 C)   SpO2 98%   Physical Exam Skin:    General: Skin is warm and dry.     Comments: Site is clean and dry NT no bleeding No fistula per imaging today per Dr Miles Costain     Imaging: IR Radiologist Eval & Mgmt  Result Date: 07/29/2019 Please refer to notes tab for details about interventional procedure. (Op Note)   Labs:  CBC: Recent Labs    06/30/19 0431 07/01/19 0444 07/03/19 0500 07/04/19 0503  WBC 8.1 8.7 9.1 9.8  HGB 10.6* 10.1* 10.9* 10.5*  HCT 32.8* 30.8* 33.9* 32.6*  PLT 189 201 245 252    COAGS: Recent Labs    06/26/19 0714 06/27/19 0411  INR 1.0 1.4*    BMP: Recent Labs    06/28/19 0358 06/30/19 0431 07/01/19 0444 07/03/19 0500  NA 135 140 142 138  K 3.7 3.5 3.5 3.6  CL 104 104 108 102  CO2 22 25 26 26   GLUCOSE 108* 103* 103* 115*  BUN 16 19 19 9   CALCIUM 8.6* 8.6* 8.4* 8.6*  CREATININE 0.96 0.88 0.89 0.82  GFRNONAA >60 >60 >60 >60  GFRAA >60 >60 >60 >60    LIVER FUNCTION TESTS: Recent Labs    06/26/19 0714 07/01/19 0444  BILITOT 0.5 0.9  AST 19 16  ALT 17 17  ALKPHOS 43 40  PROT 7.7 6.5  ALBUMIN 3.7 2.8*    Assessment:  Drain removed per Dr 06/28/19 order New dressing placed Pt is to call Dr 07/03/19 office-- need to see [facility] in 7-10 days for recheck and determine plan. Pt has good uderstanding of plan   Signed: Luisa Hart, PA-C 07/29/2019, 1:59 PM   Please refer to Dr. Robet Leu attestation of this note for management and plan.

## 2019-08-07 ENCOUNTER — Telehealth: Payer: Self-pay | Admitting: Cardiovascular Disease

## 2019-08-07 NOTE — Telephone Encounter (Signed)
*  STAT* If patient is at the pharmacy, call can be transferred to refill team.   1. Which medications need to be refilled? (please list name of each medication and dose if known)  spironolactone (ALDACTONE) 25 MG tablet  meloxicam (MOBIC) 7.5 MG tablet     2. Which pharmacy/location (including street and city if local pharmacy) is medication to be sent to? [id number] - [place], [place] - [address]  3. Do they need a 30 day or 90 day supply? 90 day  Only has 2 pills left.

## 2019-08-07 NOTE — Telephone Encounter (Signed)
Called pt to informed him that his medications were already at his pharmacy as requested. I advised him that if he has any other problems, questions or concerns, to give our office a call back. Pt verbalized understanding.

## 2019-08-19 ENCOUNTER — Other Ambulatory Visit: Payer: Self-pay | Admitting: Family Medicine

## 2019-08-19 MED ORDER — HYDROCODONE-ACETAMINOPHEN 5-325 MG PO TABS: 1.0000 | ORAL_TABLET | Freq: Every day | ORAL | 0 refills | Status: DC | PRN

## 2019-08-19 NOTE — Telephone Encounter (Signed)
Pt is needing a refill on his HYDROcodone-acetaminophen (NORCO/VICODIN) 5-325 MG tablet [facility] [address]

## 2019-08-28 ENCOUNTER — Ambulatory Visit: Payer: Self-pay | Admitting: Surgery

## 2019-08-28 NOTE — H&P (Signed)
Jeffrey Good Appointment: 08/28/2019 9:00 AM Location: [facility] Patient #: [id number] DOB: 1951-02-18 Married / Language: English / Race: Black or African American Male  History of Present Illness Jeffrey Fus A. Kima Malenfant MD; 08/28/2019 9:45 AM) Patient words: Patient returns for follow-up of perforated appendicitis treated with percutaneous drain. 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 Resolution of abscess. He has been relatively pain free for the last month. He has no nausea, vomiting or significant abdominal pain. No fever or chills.  The patient is a 69 year old male.   Allergies (Tanisha A. Manson Passey, RMA; 08/28/2019 8:52 AM) No Known Allergies [07/21/2019]: No Known Drug Allergies [07/21/2019]: Allergies Reconciled  Medication History (Tanisha A. Manson Passey, RMA; 08/28/2019 8:52 AM) Meloxicam (7.5MG  Tablet, Oral) Active. Gabapentin (300MG  Capsule, Oral) Active. Carvedilol (25MG  Tablet, Oral) Active. Doxazosin Mesylate (8MG  Tablet, Oral) Active. Lisinopril (20MG  Tablet, Oral) Active. ZyrTEC Allergy (10MG  Tablet, Oral) Active. Medications Reconciled     Review of Systems (Perina Salvaggio A. Kennedy Brines MD; 08/28/2019 9:45 AM) All other systems negative  Vitals (Tanisha A. Brown RMA; 08/28/2019 8:53 AM) 08/28/2019 8:52 AM Weight: 219.8 lb Height: 70in Body Surface Area: 2.17 m Body Mass Index: 31.54 kg/m  Temp.: 35F  Pulse: 65 (Regular)  BP: 122/72(Sitting, Left Arm, Standard)        Physical Exam (Oreta Soloway A. Zorawar Strollo MD; 08/28/2019 9:46 AM)  General Mental Status-Alert. General Appearance-Consistent with stated age. Hydration-Well hydrated. Voice-Normal.  Head and Neck Head-normocephalic, atraumatic with no lesions or palpable masses. Trachea-midline. Thyroid Gland Characteristics - normal size and consistency.  Cardiovascular Cardiovascular examination  reveals -normal heart sounds, regular rate and rhythm with no murmurs and normal pedal pulses bilaterally.  Abdomen Note: Soft nontender without rebound or guarding.  Neurologic Neurologic evaluation reveals -alert and oriented x 3 with no impairment of recent or remote memory. Mental Status-Normal.  Neuropsychiatric The patient's mood and affect are described as -normal. Associations-intact. Judgment and Insight-insight is appropriate concerning matters relevant to self.  Musculoskeletal Normal Exam - Left-Upper Extremity Strength Normal and Lower Extremity Strength Normal. Normal Exam - Right-Upper Extremity Strength Normal and Lower Extremity Strength Normal.    Assessment & Plan (Jamara Vary A. Lavona Norsworthy MD; 08/28/2019 9:47 AM)  PERFORATED APPENDIX (K35.32) Impression: Patient returns for follow-up. His last CT 1 months ago showed resolution of abscess and fistula. I discussed operative and nonoperative treatments. Discuss potential appendectomy and open surgery versus laparoscopic appendectomy. He would like to proceed electively with laparoscopic appendectomy understanding the above risks.  Total time 20 minutes  Current Plans Pt Education - [facility] Free Text Education/Instructions: discussed with patient and provided information. The anatomy & physiology of the digestive tract was discussed. The pathophysiology of appendicitis and other appendiceal disorders were discussed. Natural history risks without surgery was discussed. I feel the risks of no intervention will lead to serious problems that outweigh the operative risks; therefore, I recommended diagnostic laparoscopy with removal of appendix to remove the pathology. Laparoscopic & open techniques were discussed. I noted a good likelihood this will help address the problem. Risks such as bleeding, infection, abscess, leak, reoperation, possible ostomy, hernia, heart attack, death, and other risks were discussed.  Goals of post-operative recovery were discussed as well. We will work to minimize complications. Questions were answered. The patient expresses understanding & wishes to proceed with surgery.

## 2019-09-09 ENCOUNTER — Ambulatory Visit: Payer: Self-pay | Admitting: Surgery

## 2019-09-18 ENCOUNTER — Telehealth: Payer: Self-pay | Admitting: Family Medicine

## 2019-09-18 NOTE — Telephone Encounter (Signed)
Pt has called for a refill on his HYDROcodone-acetaminophen (NORCO/VICODIN) 5-325 MG tablet to [id number]  

## 2019-09-21 ENCOUNTER — Other Ambulatory Visit: Payer: Self-pay | Admitting: Family Medicine

## 2019-09-21 NOTE — Telephone Encounter (Signed)
Pt called needing a refill on his HYDROcodone-acetaminophen (NORCO/VICODIN) 5-325 MG tablet [facility] [address]  

## 2019-09-22 MED ORDER — HYDROCODONE-ACETAMINOPHEN 5-325 MG PO TABS: 1.0000 | ORAL_TABLET | Freq: Every day | ORAL | 0 refills | Status: DC | PRN

## 2019-09-22 NOTE — Telephone Encounter (Signed)
Pt has called because his refill request for his  HYDROcodone-acetaminophen (NORCO/VICODIN) 5-325 MG tablet [facility] [address] has yet to be filled. Please reply

## 2019-09-22 NOTE — Addendum Note (Signed)
Addended by: Salina April on: 09/22/2019 04:18 PM   Modules accepted: Orders

## 2019-10-06 ENCOUNTER — Other Ambulatory Visit: Payer: Self-pay

## 2019-10-06 ENCOUNTER — Inpatient Hospital Stay (HOSPITAL_COMMUNITY)
Admission: RE | Admit: 2019-10-06 | Discharge: 2019-10-06 | Disposition: A | Discharge status: Home / Self Care | Payer: PRIVATE HEALTH INSURANCE | Source: Ambulatory Visit

## 2019-10-06 ENCOUNTER — Encounter (HOSPITAL_COMMUNITY): Payer: Self-pay

## 2019-10-06 NOTE — Progress Notes (Signed)
Anesthesia Chart Review: Same day workup (pt missed PAT appointment)  Admitted March 2021 for perforated appendicitis that ultimately required perc drainage. Drain catheter subsequently removed by IR 07/29/19.  Follows with cardiology for management of HTN and HLD. Last seen by Dr. Elease Hashimoto 07/23/19. He had lost significant weight following recent illness and his BP medications were reduced.  Also discussed potential need for surgery for perforated appendix. Per note, "Surgery has not been mentioned at this point but if he does need abdominal surgery I think he is at low risk for any cardiovascular complications."  Will need DOS labs and eval.  EKG 06/29/19: Normal sinus rhythm. Rate 88. Left anterior fascicular block. Poor R wave progression.  [facility] Short Stay Center/Anesthesiology Phone [phone] 10/06/2019 2:26 PM

## 2019-10-06 NOTE — Pre-Procedure Instructions (Signed)
KRISTAIN HU  10/06/2019      [facility] - [place], [place] - [address] [place] [address] Phone: [phone] Fax: [phone]  [facility] [address], [place] - [address] [address] Phone: [phone] Fax: [phone]  [facility] - [address] ([address]), [place] - [address] [place] ([place]) [place] [address] Phone: [phone] Fax: [phone]    Your procedure is scheduled on 10/15/19.  Report to [facility] Admitting at 530 A.M.  Call this number if you have problems the morning of surgery:  [phone]   Remember:  Do not eat or drink after midnight.  You may drink clear liquids until 430am .  Clear liquids allowed are:                    Water, Juice (non-citric and without pulp - diabetics please choose diet or no sugar options), Carbonated beverages - (diabetics please choose diet or no sugar options), Clear Tea and Gatorade (diabetics please choose diet or no sugar options)    Take these medicines the morning of surgery with A SIP OF WATER ----TYLENOL,CARVEDILOL,CARDURA,VICODAN,LAMICTAL   Do not take any aspirin,anti-inflammatories,vitamins,or herbal supplements 5-7 days prior to surgery.[place] - Preparing for Surgery  Before surgery, you can play an important role.  Because skin is not sterile, your skin needs to be as free of germs as possible.  You can reduce the number of germs on you skin by washing with CHG (chlorahexidine gluconate) soap before surgery.  CHG is an antiseptic cleaner which kills germs and bonds with the skin to continue killing germs even after washing.  Oral Hygiene is also important in reducing the risk of infection.  Remember to brush your teeth with your regular toothpaste the morning of surgery.  Please DO NOT use if you have an allergy to CHG or antibacterial soaps.  If your skin becomes  reddened/irritated stop using the CHG and inform your nurse when you arrive at Short Stay.  Do not shave (including legs and underarms) for at least 48 hours prior to the first CHG shower.  You may shave your face.  Please follow these instructions carefully:   1.  Shower with CHG Soap the night before surgery and the morning of Surgery.  2.  If you choose to wash your hair, wash your hair first as usual with your normal shampoo.  3.  After you shampoo, rinse your hair and body thoroughly to remove the shampoo. 4.  Use CHG as you would any other liquid soap.  You can apply chg directly to the skin and wash gently with a      scrungie or washcloth.           5.  Apply the CHG Soap to your body ONLY FROM THE NECK DOWN.   Do not use on open wounds or open sores. Avoid contact with your eyes, ears, mouth and genitals (private parts).  Wash genitals (private parts) with your normal soap.  6.  Wash thoroughly, paying special attention to the area where your surgery will be performed.  7.  Thoroughly rinse your body with warm water from the neck down.  8.  DO NOT shower/wash with your normal soap after using and rinsing off the CHG Soap.  9.  Pat yourself dry with a clean towel.  10.  Wear clean pajamas.            11.  Place clean sheets on your bed the night of your first shower and do not sleep with pets.  Day of Surgery  Do not apply any lotions/deoderants the morning of surgery.   Please wear clean clothes to the hospital/surgery center. Remember to brush your teeth with toothpaste.     Do not wear jewelry, make-up or nail polish.  Do not wear lotions, powders, or perfumes, or deodorant.  Do not shave 48 hours prior to surgery.  Men may shave face and neck.  Do not bring valuables to the hospital.  [facility] is not responsible for any belongings or valuables.  Contacts, dentures or bridgework may not be worn into surgery.  Leave your suitcase in the car.  After surgery it  may be brought to your room.  For patients admitted to the hospital, discharge time will be determined by your treatment team.  Patients discharged the day of surgery will not be allowed to drive home.   Name and phone number of your driver:       Please read over the following fact sheets that you were given.

## 2019-10-06 NOTE — Progress Notes (Signed)
PCP - dr sun Cardiologist - DR NASHER        Chest x-ray - 3/21 EKG - 3/21  -               -       ns: Aspirin Instructions:STOP  ERAS Protcol -YES PRE-SURGERY Ensure or G2- DISCUSSED DIET  COVID TEST- FOR MON   Anesthesia review: HEART HX. PT WAS NO SHOW FOR PAT  Patient denies shortness of breath, fever, cough and chest pain at PAT appointment   All instructions explained to the patient, with a verbal understanding of the material. Patient agrees to go over the instructions while at home for a better understanding. Patient also instructed to self quarantine after being tested for COVID-19. The opportunity to ask questions was provided.

## 2019-10-06 NOTE — Anesthesia Preprocedure Evaluation (Addendum)
Anesthesia Evaluation  Patient identified by MRN, date of birth, ID band Patient awake    Reviewed: Allergy & Precautions, H&P , NPO status , Patient's Chart, lab work & pertinent test results  Airway Mallampati: II  TM Distance: >3 FB Neck ROM: Full    Dental no notable dental hx. (+) Partial Lower, Partial Upper, Dental Advisory Given   Pulmonary neg pulmonary ROS,    Pulmonary exam normal breath sounds clear to auscultation       Cardiovascular Exercise Tolerance: Good hypertension, Pt. on medications and Pt. on home beta blockers  Rhythm:Regular Rate:Normal     Neuro/Psych negative neurological ROS  negative psych ROS   GI/Hepatic negative GI ROS, Neg liver ROS,   Endo/Other  negative endocrine ROS  Renal/GU negative Renal ROS  negative genitourinary   Musculoskeletal   Abdominal   Peds  Hematology negative hematology ROS (+)   Anesthesia Other Findings   Reproductive/Obstetrics negative OB ROS                           Anesthesia Physical Anesthesia Plan  ASA: II  Anesthesia Plan: General   Post-op Pain Management:    Induction: Intravenous  PONV Risk Score and Plan: 3 and Ondansetron, Dexamethasone and Midazolam  Airway Management Planned: Oral ETT  Additional Equipment:   Intra-op Plan:   Post-operative Plan: Extubation in OR  Informed Consent: I have reviewed the patients History and Physical, chart, labs and discussed the procedure including the risks, benefits and alternatives for the proposed anesthesia with the patient or authorized representative who has indicated his/her understanding and acceptance.     Dental advisory given  Plan Discussed with: CRNA  Anesthesia Plan Comments: (PAT note by Antionette Poles, PA-C: Admitted March 2021 for perforated appendicitis that ultimately required perc drainage. Drain catheter subsequently removed by IR 07/29/19.  Follows  with cardiology for management of HTN and HLD. Last seen by Dr. Elease Hashimoto 07/23/19. He had lost significant weight following recent illness and his BP medications were reduced.  Also discussed potential need for surgery for perforated appendix. Per note, "Surgery has not been mentioned at this point but if he does need abdominal surgery I think he is at low risk for any cardiovascular complications."  Will need DOS labs and eval.  EKG 06/29/19: Normal sinus rhythm. Rate 88. Left anterior fascicular block. Poor R wave progression. )      Anesthesia Quick Evaluation

## 2019-10-13 ENCOUNTER — Other Ambulatory Visit (HOSPITAL_COMMUNITY)
Admission: RE | Admit: 2019-10-13 | Discharge: 2019-10-13 | Disposition: A | Discharge status: Home / Self Care | Payer: PRIVATE HEALTH INSURANCE | Source: Ambulatory Visit | Attending: Surgery | Admitting: Surgery

## 2019-10-13 DIAGNOSIS — Z01812 Encounter for preprocedural laboratory examination: Secondary | ICD-10-CM | POA: Insufficient documentation

## 2019-10-13 DIAGNOSIS — Z20822 Contact with and (suspected) exposure to covid-19: Secondary | ICD-10-CM | POA: Insufficient documentation

## 2019-10-13 LAB — SARS CORONAVIRUS 2 (TAT 6-24 HRS): SARS Coronavirus 2: NEGATIVE

## 2019-10-14 MED ORDER — SODIUM CHLORIDE 0.9 % IV SOLN
1.0000 g | INTRAVENOUS | Status: DC
  Filled 2019-10-14: qty 1

## 2019-10-15 ENCOUNTER — Ambulatory Visit (HOSPITAL_COMMUNITY)
Admission: RE | Admit: 2019-10-15 | Discharge: 2019-10-15 | Disposition: A | Discharge status: Home / Self Care | Payer: PRIVATE HEALTH INSURANCE | Attending: Surgery | Admitting: Surgery

## 2019-10-15 ENCOUNTER — Encounter (HOSPITAL_COMMUNITY): Payer: Self-pay | Admitting: Surgery

## 2019-10-15 ENCOUNTER — Ambulatory Visit (HOSPITAL_COMMUNITY): Payer: PRIVATE HEALTH INSURANCE | Admitting: Anesthesiology

## 2019-10-15 ENCOUNTER — Other Ambulatory Visit: Payer: Self-pay

## 2019-10-15 ENCOUNTER — Encounter (HOSPITAL_COMMUNITY)
Admission: RE | Disposition: A | Discharge status: Home / Self Care | Payer: Self-pay | Source: Home / Self Care | Attending: Surgery

## 2019-10-15 ENCOUNTER — Ambulatory Visit (HOSPITAL_COMMUNITY): Payer: PRIVATE HEALTH INSURANCE | Admitting: Vascular Surgery

## 2019-10-15 DIAGNOSIS — Z791 Long term (current) use of non-steroidal anti-inflammatories (NSAID): Secondary | ICD-10-CM | POA: Insufficient documentation

## 2019-10-15 DIAGNOSIS — K3532 Acute appendicitis with perforation and localized peritonitis, without abscess: Secondary | ICD-10-CM | POA: Diagnosis not present

## 2019-10-15 DIAGNOSIS — I10 Essential (primary) hypertension: Secondary | ICD-10-CM | POA: Diagnosis not present

## 2019-10-15 DIAGNOSIS — Z79899 Other long term (current) drug therapy: Secondary | ICD-10-CM | POA: Insufficient documentation

## 2019-10-15 HISTORY — DX: Other seasonal allergic rhinitis: J30.2

## 2019-10-15 HISTORY — PX: LAPAROSCOPIC APPENDECTOMY: SHX408

## 2019-10-15 LAB — CBC WITH DIFFERENTIAL/PLATELET
Abs Immature Granulocytes: 0.02 10*3/uL (ref 0.00–0.07)
Basophils Absolute: 0 10*3/uL (ref 0.0–0.1)
Basophils Relative: 0 %
Eosinophils Absolute: 0.1 10*3/uL (ref 0.0–0.5)
Eosinophils Relative: 1 %
HCT: 38.6 % — ABNORMAL LOW (ref 39.0–52.0)
Hemoglobin: 11.9 g/dL — ABNORMAL LOW (ref 13.0–17.0)
Immature Granulocytes: 0 %
Lymphocytes Relative: 54 %
Lymphs Abs: 2.7 10*3/uL (ref 0.7–4.0)
MCH: 27.7 pg (ref 26.0–34.0)
MCHC: 30.8 g/dL (ref 30.0–36.0)
MCV: 90 fL (ref 80.0–100.0)
Monocytes Absolute: 0.5 10*3/uL (ref 0.1–1.0)
Monocytes Relative: 10 %
Neutro Abs: 1.8 10*3/uL (ref 1.7–7.7)
Neutrophils Relative %: 35 %
Platelets: 158 10*3/uL (ref 150–400)
RBC: 4.29 MIL/uL (ref 4.22–5.81)
RDW: 14 % (ref 11.5–15.5)
WBC: 5.1 10*3/uL (ref 4.0–10.5)
nRBC: 0 % (ref 0.0–0.2)

## 2019-10-15 LAB — COMPREHENSIVE METABOLIC PANEL
ALT: 22 U/L (ref 0–44)
AST: 20 U/L (ref 15–41)
Albumin: 3.6 g/dL (ref 3.5–5.0)
Alkaline Phosphatase: 58 U/L (ref 38–126)
Anion gap: 8 (ref 5–15)
BUN: 17 mg/dL (ref 8–23)
CO2: 26 mmol/L (ref 22–32)
Calcium: 9.2 mg/dL (ref 8.9–10.3)
Chloride: 105 mmol/L (ref 98–111)
Creatinine, Ser: 1 mg/dL (ref 0.61–1.24)
GFR calc Af Amer: >60 mL/min (ref >60)
GFR calc non Af Amer: >60 mL/min (ref >60)
Glucose, Bld: 97 mg/dL (ref 70–99)
Potassium: 4.2 mmol/L (ref 3.5–5.1)
Sodium: 139 mmol/L (ref 135–145)
Total Bilirubin: 0.4 mg/dL (ref 0.3–1.2)
Total Protein: 7.3 g/dL (ref 6.5–8.1)

## 2019-10-15 SURGERY — APPENDECTOMY, LAPAROSCOPIC
Anesthesia: General | Site: Abdomen

## 2019-10-15 MED ORDER — PHENYLEPHRINE 40 MCG/ML (10ML) SYRINGE FOR IV PUSH (FOR BLOOD PRESSURE SUPPORT)
PREFILLED_SYRINGE | INTRAVENOUS | Status: DC | PRN
  Administered 2019-10-15 (×3): 80 ug via INTRAVENOUS

## 2019-10-15 MED ORDER — CHLORHEXIDINE GLUCONATE CLOTH 2 % EX PADS: 6.0000 | MEDICATED_PAD | Freq: Once | CUTANEOUS | Status: DC

## 2019-10-15 MED ORDER — BUPIVACAINE-EPINEPHRINE 0.5% -1:200000 IJ SOLN
INTRAMUSCULAR | Status: AC
  Filled 2019-10-15: qty 1

## 2019-10-15 MED ORDER — ONDANSETRON HCL 4 MG/2ML IJ SOLN: INTRAMUSCULAR | Status: DC | PRN

## 2019-10-15 MED ORDER — MIDAZOLAM HCL 2 MG/2ML IJ SOLN
INTRAMUSCULAR | Status: AC
  Filled 2019-10-15: qty 2

## 2019-10-15 MED ORDER — CELECOXIB 200 MG PO CAPS
200.0000 mg | ORAL_CAPSULE | ORAL | Status: AC
  Administered 2019-10-15: 200 mg via ORAL
  Filled 2019-10-15: qty 1

## 2019-10-15 MED ORDER — HYDROMORPHONE HCL 1 MG/ML IJ SOLN: 0.2500 mg | INTRAMUSCULAR | Status: DC | PRN

## 2019-10-15 MED ORDER — DEXAMETHASONE SODIUM PHOSPHATE 10 MG/ML IJ SOLN
INTRAMUSCULAR | Status: DC | PRN
  Administered 2019-10-15: 10 mg via INTRAVENOUS

## 2019-10-15 MED ORDER — FENTANYL CITRATE (PF) 250 MCG/5ML IJ SOLN
INTRAMUSCULAR | Status: AC
  Filled 2019-10-15: qty 5

## 2019-10-15 MED ORDER — BUPIVACAINE-EPINEPHRINE 0.5% -1:200000 IJ SOLN
INTRAMUSCULAR | Status: DC | PRN
  Administered 2019-10-15: 10 mL

## 2019-10-15 MED ORDER — SODIUM CHLORIDE 0.9 % IV SOLN
2.0000 g | INTRAVENOUS | Status: DC
  Filled 2019-10-15: qty 2

## 2019-10-15 MED ORDER — ROCURONIUM BROMIDE 10 MG/ML (PF) SYRINGE
PREFILLED_SYRINGE | INTRAVENOUS | Status: DC | PRN
  Administered 2019-10-15: 60 mg via INTRAVENOUS

## 2019-10-15 MED ORDER — ACETAMINOPHEN 500 MG PO TABS
1000.0000 mg | ORAL_TABLET | ORAL | Status: AC
  Administered 2019-10-15: 1000 mg via ORAL
  Filled 2019-10-15: qty 2

## 2019-10-15 MED ORDER — MIDAZOLAM HCL 5 MG/5ML IJ SOLN
INTRAMUSCULAR | Status: DC | PRN
  Administered 2019-10-15: 2 mg via INTRAVENOUS

## 2019-10-15 MED ORDER — SODIUM CHLORIDE 0.9 % IR SOLN
Status: DC | PRN
  Administered 2019-10-15: 1000 mL

## 2019-10-15 MED ORDER — LIDOCAINE 2% (20 MG/ML) 5 ML SYRINGE
INTRAMUSCULAR | Status: DC | PRN
  Administered 2019-10-15: 60 mg via INTRAVENOUS

## 2019-10-15 MED ORDER — IBUPROFEN 800 MG PO TABS
800.0000 mg | ORAL_TABLET | Freq: Three times a day (TID) | ORAL | 0 refills | Status: DC | PRN
Start: 2019-10-15 — End: 2020-07-28

## 2019-10-15 MED ORDER — PHENYLEPHRINE HCL-NACL 10-0.9 MG/250ML-% IV SOLN
INTRAVENOUS | Status: DC | PRN
  Administered 2019-10-15: 30 ug/min via INTRAVENOUS

## 2019-10-15 MED ORDER — ONDANSETRON HCL 4 MG/2ML IJ SOLN
INTRAMUSCULAR | Status: DC | PRN
  Administered 2019-10-15: 4 mg via INTRAVENOUS

## 2019-10-15 MED ORDER — PROPOFOL 10 MG/ML IV BOLUS
INTRAVENOUS | Status: DC | PRN
  Administered 2019-10-15: 130 mg via INTRAVENOUS

## 2019-10-15 MED ORDER — GABAPENTIN 300 MG PO CAPS
300.0000 mg | ORAL_CAPSULE | ORAL | Status: AC
  Administered 2019-10-15: 300 mg via ORAL
  Filled 2019-10-15: qty 1

## 2019-10-15 MED ORDER — LACTATED RINGERS IV SOLN: INTRAVENOUS | Status: DC | PRN

## 2019-10-15 MED ORDER — EPHEDRINE SULFATE-NACL 50-0.9 MG/10ML-% IV SOSY
PREFILLED_SYRINGE | INTRAVENOUS | Status: DC | PRN
  Administered 2019-10-15: 10 mg via INTRAVENOUS

## 2019-10-15 MED ORDER — PROPOFOL 10 MG/ML IV BOLUS
INTRAVENOUS | Status: AC
  Filled 2019-10-15: qty 40

## 2019-10-15 MED ORDER — OXYCODONE HCL 5 MG PO TABS
5.0000 mg | ORAL_TABLET | Freq: Four times a day (QID) | ORAL | 0 refills | Status: DC | PRN
Start: 2019-10-15 — End: 2020-01-21

## 2019-10-15 MED ORDER — SUGAMMADEX SODIUM 200 MG/2ML IV SOLN
INTRAVENOUS | Status: DC | PRN
  Administered 2019-10-15: 200 mg via INTRAVENOUS

## 2019-10-15 MED ORDER — FENTANYL CITRATE (PF) 250 MCG/5ML IJ SOLN
INTRAMUSCULAR | Status: DC | PRN
  Administered 2019-10-15: 100 ug via INTRAVENOUS

## 2019-10-15 MED ORDER — SODIUM CHLORIDE 0.9 % IV SOLN
INTRAVENOUS | Status: DC | PRN
  Administered 2019-10-15: 2 g via INTRAVENOUS

## 2019-10-15 MED ORDER — 0.9 % SODIUM CHLORIDE (POUR BTL) OPTIME
TOPICAL | Status: DC | PRN
  Administered 2019-10-15: 1000 mL

## 2019-10-15 SURGICAL SUPPLY — 43 items
APPLIER CLIP ROT 10 11.4 M/L (STAPLE)
BLADE CLIPPER SURG (BLADE) IMPLANT
CANISTER SUCT 3000ML PPV (MISCELLANEOUS) ×3 IMPLANT
CHLORAPREP W/TINT 26 (MISCELLANEOUS) ×3 IMPLANT
CLIP APPLIE ROT 10 11.4 M/L (STAPLE) IMPLANT
COVER SURGICAL LIGHT HANDLE (MISCELLANEOUS) ×3 IMPLANT
COVER WAND RF STERILE (DRAPES) IMPLANT
CUTTER FLEX LINEAR 45M (STAPLE) ×3 IMPLANT
DERMABOND ADVANCED (GAUZE/BANDAGES/DRESSINGS) ×2
DERMABOND ADVANCED .7 DNX12 (GAUZE/BANDAGES/DRESSINGS) ×1 IMPLANT
ELECT REM PT RETURN 9FT ADLT (ELECTROSURGICAL) ×3
ELECTRODE REM PT RTRN 9FT ADLT (ELECTROSURGICAL) ×1 IMPLANT
ENDOLOOP SUT PDS II  0 18 (SUTURE)
ENDOLOOP SUT PDS II 0 18 (SUTURE) IMPLANT
GLOVE BIO SURGEON STRL SZ8 (GLOVE) ×3 IMPLANT
GLOVE BIOGEL PI IND STRL 8 (GLOVE) ×1 IMPLANT
GLOVE BIOGEL PI INDICATOR 8 (GLOVE) ×2
GOWN STRL REUS W/ TWL LRG LVL3 (GOWN DISPOSABLE) ×2 IMPLANT
GOWN STRL REUS W/ TWL XL LVL3 (GOWN DISPOSABLE) ×1 IMPLANT
GOWN STRL REUS W/TWL LRG LVL3 (GOWN DISPOSABLE) ×4
GOWN STRL REUS W/TWL XL LVL3 (GOWN DISPOSABLE) ×2
KIT BASIN OR (CUSTOM PROCEDURE TRAY) ×3 IMPLANT
KIT TURNOVER KIT B (KITS) ×3 IMPLANT
NS IRRIG 1000ML POUR BTL (IV SOLUTION) ×3 IMPLANT
PAD ARMBOARD 7.5X6 YLW CONV (MISCELLANEOUS) ×6 IMPLANT
POUCH RETRIEVAL ECOSAC 10 (ENDOMECHANICALS) ×1 IMPLANT
POUCH RETRIEVAL ECOSAC 10MM (ENDOMECHANICALS) ×3
RELOAD STAPLE TA45 3.5 REG BLU (ENDOMECHANICALS) ×3 IMPLANT
SCISSORS LAP 5X35 DISP (ENDOMECHANICALS) ×3 IMPLANT
SET IRRIG TUBING LAPAROSCOPIC (IRRIGATION / IRRIGATOR) ×3 IMPLANT
SET TUBE SMOKE EVAC HIGH FLOW (TUBING) ×3 IMPLANT
SHEARS HARMONIC ACE PLUS 36CM (ENDOMECHANICALS) ×3 IMPLANT
SPECIMEN JAR SMALL (MISCELLANEOUS) ×3 IMPLANT
SUT MON AB 4-0 PC3 18 (SUTURE) ×3 IMPLANT
TOWEL GREEN STERILE (TOWEL DISPOSABLE) ×3 IMPLANT
TOWEL GREEN STERILE FF (TOWEL DISPOSABLE) ×3 IMPLANT
TRAY FOLEY W/BAG SLVR 16FR (SET/KITS/TRAYS/PACK)
TRAY FOLEY W/BAG SLVR 16FR ST (SET/KITS/TRAYS/PACK) IMPLANT
TRAY LAPAROSCOPIC MC (CUSTOM PROCEDURE TRAY) ×3 IMPLANT
TROCAR XCEL BLADELESS 5X75MML (TROCAR) ×6 IMPLANT
TROCAR XCEL BLUNT TIP 100MML (ENDOMECHANICALS) ×3 IMPLANT
WARMER LAPAROSCOPE (MISCELLANEOUS) ×3 IMPLANT
WATER STERILE IRR 1000ML POUR (IV SOLUTION) ×3 IMPLANT

## 2019-10-15 NOTE — Anesthesia Procedure Notes (Signed)
Procedure Name: Intubation Date/Time: 10/15/2019 7:38 AM Performed by: Griffin Dakin, CRNA Pre-anesthesia Checklist: Patient identified, Emergency Drugs available, Suction available and Patient being monitored Patient Re-evaluated:Patient Re-evaluated prior to induction Oxygen Delivery Method: Circle system utilized Preoxygenation: Pre-oxygenation with 100% oxygen Induction Type: IV induction Ventilation: Mask ventilation without difficulty Laryngoscope Size: Mac and 4 Grade View: Grade I Tube type: Oral Tube size: 7.5 mm Number of attempts: 1 Airway Equipment and Method: Stylet and Oral airway Placement Confirmation: ETT inserted through vocal cords under direct vision,  positive ETCO2 and breath sounds checked- equal and bilateral Secured at: 23 cm Tube secured with: Tape Dental Injury: Teeth and Oropharynx as per pre-operative assessment  Comments: Performed by paramedic student.

## 2019-10-15 NOTE — Transfer of Care (Signed)
Immediate Anesthesia Transfer of Care Note  Patient: COLSEN MODI  Procedure(s) Performed: LAPAROSCOPIC APPENDECTOMY (N/A Abdomen)  Patient Location: PACU  Anesthesia Type:General  Level of Consciousness: drowsy  Airway & Oxygen Therapy: Patient Spontanous Breathing and Patient connected to face mask oxygen  Post-op Assessment: Report given to RN and Post -op Vital signs reviewed and stable  Post vital signs: Reviewed and stable  Last Vitals:  Vitals Value Taken Time  BP 115/79 10/15/19 0855  Temp 36.1 C 10/15/19 0855  Pulse 57 10/15/19 0858  Resp 10 10/15/19 0858  SpO2 100 % 10/15/19 0858  Vitals shown include unvalidated device data.  Last Pain:  Vitals:   10/15/19 0855  TempSrc:   PainSc: Asleep      Patients Stated Pain Goal: 5 (10/15/19 0622)  Complications: No complications documented.

## 2019-10-15 NOTE — Op Note (Signed)
Appendectomy, Lap, Procedure Note  Indications: The patient presented with a history of right-sided abdominal pain 3 months ago and had CT findings of perforated appendicitis.  He had a drain placed and was treated medically .He opted for interval appendectomy after talking about medical and surgical options and risk of recurrence of 30- 40 % over the next 12 months.  The procedure has been discussed with the patient.  Alternative therapies have been discussed with the patient.  Operative risks include bleeding,  Infection,  Organ injury,  Nerve injury,  Blood vessel injury,  DVT,  Pulmonary embolism,  Death,  And possible reoperation.  Medical management risks include worsening of present situation.  The success of the procedure is 50 -90 % at treating patients symptoms.  The patient understands and agrees to proceed. Pre-operative Diagnosis: Other appendicitis  Post-operative Diagnosis: Same  Surgeon: Dortha Schwalbe  MD   Assistants: or STAFF   Anesthesia: General endotracheal anesthesia  ASA Class: 2  Procedure Details  The patient was seen again in the Holding Room. The risks, benefits, complications, treatment options, and expected outcomes were discussed with the patient and/or family. The possibilities of reaction to medication, pulmonary aspiration, perforation of viscus, bleeding, recurrent infection, finding a normal appendix, the need for additional procedures, failure to diagnose a condition, and creating a complication requiring transfusion or operation were discussed. There was concurrence with the proposed plan and informed consent was obtained. The site of surgery was properly noted/marked. The patient was taken to Operating Room, identified as Jeffrey Good and the procedure verified as Appendectomy. A Time Out was held and the above information confirmed.  The patient was placed in the supine position and general anesthesia was induced, along with placement of orogastric tube,  Venodyne boots, and a Foley catheter. The abdomen was prepped and draped in a sterile fashion. A one centimeter infraumbilical incision was made and the peritoneal cavity was accessed using the OPEN  technique. The pneumoperitoneum was then established to steady pressure of 12 mmHg. A 12 mm port was placed through the umbilical incision. Additional 5 mm cannulas then placed in the left lower quadrant of the abdomen and half way between the umbilicus and xyphoid  under direct vision. A careful evaluation of the entire abdomen was carried out. The patient was placed in Trendelenburg and left lateral decubitus position. The small intestines were retracted in the cephalad and left lateral direction away from the pelvis and right lower quadrant. The patient was found to have an enlarged and CHRONICALLY  inflamed appendix that was extending into the pelvis. There was no evidence of perforation.He had some loose adhesions that were taken down by sharp dissection.   The appendix was carefully dissected. A window was made in the mesoappendix at the base of the appendix. A harmonic scalpel was used across the mesoappendix. The appendix was divided at its base using an endo-GIA stapler. Minimal appendiceal stump was left in place. There was no evidence of bleeding, leakage, or complication after division of the appendix. Irrigation was also performed and irrigate suctioned from the abdomen as well.  The umbilical port site was closed using 0 vicryl pursestring sutures fashion at the level of the fascia. The trocar site skin closed with 4 0 monocryl.  Instrument, sponge, and needle counts were correct at the conclusion of the case.   Findings: The appendix was found to be inflamed. There were not signs of necrosis.  There was not perforation. There was not abscess  formation.  Estimated Blood Loss:  Minimal         Drains: none         Total IV Fluids: per record          Specimens: appendix           Complications:  None; patient tolerated the procedure well.         Disposition: PACU - hemodynamically stable.         Condition: stable

## 2019-10-15 NOTE — Discharge Instructions (Signed)
[facility] ______[facility] °LAPAROSCOPIC SURGERY: POST OP INSTRUCTIONS °Always review your discharge instruction sheet given to you by the facility where your surgery was performed. °IF YOU HAVE DISABILITY OR FAMILY LEAVE FORMS, YOU MUST BRING THEM TO THE OFFICE FOR PROCESSING.   °DO NOT GIVE THEM TO YOUR DOCTOR. ° °1. A prescription for pain medication may be given to you upon discharge.  Take your pain medication as prescribed, if needed.  If narcotic pain medicine is not needed, then you may take acetaminophen (Tylenol) or ibuprofen (Advil) as needed. °2. Take your usually prescribed medications unless otherwise directed. °3. If you need a refill on your pain medication, please contact your pharmacy.  They will contact our office to request authorization. Prescriptions will not be filled after 5pm or on week-ends. °4. You should follow a light diet the first few days after arrival home, such as soup and crackers, etc.  Be sure to include lots of fluids daily. °5. Most patients will experience some swelling and bruising in the area of the incisions.  Ice packs will help.  Swelling and bruising can take several days to resolve.  °6. It is common to experience some constipation if taking pain medication after surgery.  Increasing fluid intake and taking a stool softener (such as Colace) will usually help or prevent this problem from occurring.  A mild laxative (Milk of Magnesia or Miralax) should be taken according to package instructions if there are no bowel movements after 48 hours. °7. Unless discharge instructions indicate otherwise, you may remove your bandages 24-48 hours after surgery, and you may shower at that time.  You may have steri-strips (small skin tapes) in place directly over the incision.  These strips should be left on the skin for 7-10 days.  If your surgeon used skin glue on the incision, you may shower in 24 hours.  The glue will flake off over the next 2-3 weeks.  Any sutures or  staples will be removed at the office during your follow-up visit. °8. ACTIVITIES:  You may resume regular (light) daily activities beginning the next day--such as daily self-care, walking, climbing stairs--gradually increasing activities as tolerated.  You may have sexual intercourse when it is comfortable.  Refrain from any heavy lifting or straining until approved by your doctor. °a. You may drive when you are no longer taking prescription pain medication, you can comfortably wear a seatbelt, and you can safely maneuver your car and apply brakes. °b. RETURN TO WORK:  __________________________________________________________ °9. You should see your doctor in the office for a follow-up appointment approximately 2-3 weeks after your surgery.  Make sure that you call for this appointment within a day or two after you arrive home to insure a convenient appointment time. °10. OTHER INSTRUCTIONS: __________________________________________________________________________________________________________________________ __________________________________________________________________________________________________________________________ °WHEN TO CALL YOUR DOCTOR: °1. Fever over 101.0 °2. Inability to urinate °3. Continued bleeding from incision. °4. Increased pain, redness, or drainage from the incision. °5. Increasing abdominal pain ° °The clinic staff is available to answer your questions during regular business hours.  Please don’t hesitate to call and ask to speak to one of the nurses for clinical concerns.  If you have a medical emergency, go to the nearest emergency room or call 911.  A surgeon from [facility] is always on call at the hospital. °[address], [address], [place], [place]  [address] ? [address] [address], , [place]   [address] °[phone] ? [phone] ? [phone] [phone] °Web site:   [email or web address] °

## 2019-10-15 NOTE — H&P (Signed)
Jeffrey Good  Location: [facility] Patient #: [id number] DOB: 1951-01-25 Married / Language: English / Race: Black or African American Male  History of Present Illness Jeffrey Fus A. Arrion Burruel MD; 08/28/2019 9:45 AM) Patient words: Patient returns for follow-up of perforated appendicitis treated with percutaneous drain. 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 Resolution of abscess. He has been relatively pain free for the last month. He has no nausea, vomiting or significant abdominal pain. No fever or chills.  The patient is a 69 year old male.   Allergies No Known Allergies [07/21/2019]: No Known Drug Allergies [07/21/2019]: Allergies Reconciled  Medication History  Meloxicam (7.5MG  Tablet, Oral) Active. Gabapentin (300MG  Capsule, Oral) Active. Carvedilol (25MG  Tablet, Oral) Active. Doxazosin Mesylate (8MG  Tablet, Oral) Active. Lisinopril (20MG  Tablet, Oral) Active. ZyrTEC Allergy (10MG  Tablet, Oral) Active. Medications Reconciled     Review of Systems All other systems negative  Vitals 08/28/2019 8:52 AM Weight: 219.8 lb Height: 70in Body Surface Area: 2.17 m Body Mass Index: 31.54 kg/m  Temp.: 16F  Pulse: 65 (Regular)  BP: 122/72(Sitting, Left Arm, Standard)        Physical Exam  General Mental Status-Alert. General Appearance-Consistent with stated age. Hydration-Well hydrated. Voice-Normal.  Head and Neck Head-normocephalic, atraumatic with no lesions or palpable masses. Trachea-midline. Thyroid Gland Characteristics - normal size and consistency.  Cardiovascular Cardiovascular examination reveals -normal heart sounds, regular rate and rhythm with no murmurs and normal pedal pulses bilaterally.  Abdomen Note: Soft nontender without rebound or guarding.  Neurologic Neurologic evaluation reveals  -alert and oriented x 3 with no impairment of recent or remote memory. Mental Status-Normal.  Neuropsychiatric The patient's mood and affect are described as -normal. Associations-intact. Judgment and Insight-insight is appropriate concerning matters relevant to self.  Musculoskeletal Normal Exam - Left-Upper Extremity Strength Normal and Lower Extremity Strength Normal. Normal Exam - Right-Upper Extremity Strength Normal and Lower Extremity Strength Normal.    Assessment & Plan  PERFORATED APPENDIX (K35.32) Impression: Patient returns for follow-up. His last CT 1 months ago showed resolution of abscess and fistula. I discussed operative and nonoperative treatments. Discuss potential appendectomy and open surgery versus laparoscopic appendectomy. He would like to proceed electively with laparoscopic appendectomy understanding the above risks.  Total time 20 minutes  Current Plans Pt Education - [facility] Free Text Education/Instructions: discussed with patient and provided information. The anatomy & physiology of the digestive tract was discussed. The pathophysiology of appendicitis and other appendiceal disorders were discussed. Natural history risks without surgery was discussed. I feel the risks of no intervention will lead to serious problems that outweigh the operative risks; therefore, I recommended diagnostic laparoscopy with removal of appendix to remove the pathology. Laparoscopic & open techniques were discussed. I noted a good likelihood this will help address the problem. Risks such as bleeding, infection, abscess, leak, reoperation, possible ostomy, hernia, heart attack, death, and other risks were discussed. Goals of post-operative recovery were discussed as well. We will work to minimize complications. Questions were answered. The patient expresses understanding & wishes to proceed with surgery.

## 2019-10-15 NOTE — Anesthesia Postprocedure Evaluation (Signed)
Anesthesia Post Note  Patient: Jeffrey Good  Procedure(s) Performed: LAPAROSCOPIC APPENDECTOMY (N/A Abdomen)     Patient location during evaluation: PACU Anesthesia Type: General Level of consciousness: awake and alert Pain management: pain level controlled Vital Signs Assessment: post-procedure vital signs reviewed and stable Respiratory status: spontaneous breathing, nonlabored ventilation and respiratory function stable Cardiovascular status: blood pressure returned to baseline and stable Postop Assessment: no apparent nausea or vomiting Anesthetic complications: no   No complications documented.  Last Vitals:  Vitals:   10/15/19 0915 10/15/19 0930  BP: 121/85 130/82  Pulse: 61 65  Resp: 13 14  Temp:    SpO2: 100% 99%    Last Pain:  Vitals:   10/15/19 0915  TempSrc:   PainSc: 0-No pain                 Yasmen Cortner,W. EDMOND

## 2019-10-16 ENCOUNTER — Encounter (HOSPITAL_COMMUNITY): Payer: Self-pay | Admitting: Surgery

## 2019-10-16 LAB — SURGICAL PATHOLOGY

## 2019-10-25 ENCOUNTER — Encounter: Payer: Self-pay | Admitting: Cardiovascular Disease
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
## 2019-10-26 ENCOUNTER — Other Ambulatory Visit: Payer: Self-pay

## 2019-10-26 ENCOUNTER — Encounter: Payer: Self-pay | Admitting: Cardiovascular Disease

## 2019-10-26 ENCOUNTER — Telehealth (INDEPENDENT_AMBULATORY_CARE_PROVIDER_SITE_OTHER): Payer: PRIVATE HEALTH INSURANCE | Admitting: Cardiovascular Disease

## 2019-10-26 VITALS — Ht 70.0 in | Wt 222.0 lb

## 2019-10-26 DIAGNOSIS — I10 Essential (primary) hypertension: Secondary | ICD-10-CM

## 2019-10-26 DIAGNOSIS — E782 Mixed hyperlipidemia: Secondary | ICD-10-CM

## 2019-10-26 NOTE — Patient Instructions (Signed)
Medication Instructions:  Your physician recommends that you continue on your current medications as directed. Please refer to the Current Medication list given to you today.  *If you need a refill on your cardiac medications before your next appointment, please call your pharmacy*   Lab Work: Your physician recommends that you return for lab work in: 6 months on the day of or a few days before your office visit  You will need to FAST for this appointment - nothing to eat or drink after midnight the night before except water.   If you have labs (blood work) drawn today and your tests are completely normal, you will receive your results only by: Marland Kitchen MyChart Message (if you have MyChart) OR . A paper copy in the mail If you have any lab test that is abnormal or we need to change your treatment, we will call you to review the results.   Testing/Procedures: None Ordered   Follow-Up: At [facility], you and your health needs are our priority.  As part of our continuing mission to provide you with exceptional heart care, we have created designated Provider Care Teams.  These Care Teams include your primary Cardiologist (physician) and Advanced Practice Providers (APPs -  Physician Assistants and Nurse Practitioners) who all work together to provide you with the care you need, when you need it.  We recommend signing up for the patient portal called "MyChart".  Sign up information is provided on this After Visit Summary.  MyChart is used to connect with patients for Virtual Visits (Telemedicine).  Patients are able to view lab/test results, encounter notes, upcoming appointments, etc.  Non-urgent messages can be sent to your provider as well.   To learn more about what you can do with MyChart, go to [email or web address].    Your next appointment:   6 month(s)  The format for your next appointment:   In Person  Provider:   Tereso Newcomer, PA-C or Chelsea Aus, PA-C

## 2019-10-27 ENCOUNTER — Other Ambulatory Visit: Payer: Self-pay | Admitting: Family Medicine

## 2019-10-27 MED ORDER — HYDROCODONE-ACETAMINOPHEN 5-325 MG PO TABS: 1.0000 | ORAL_TABLET | ORAL | 0 refills | Status: DC | PRN

## 2019-10-27 NOTE — Telephone Encounter (Signed)
Pt is asking if Dr Epimenio Foot can fill his HYDROcodone-acetaminophen (NORCO/VICODIN) 5-325 MG tablet to  [phone] .  Pt states the oxyCODONE (OXY IR/ROXICODONE) 5 MG immediate release tablet that was called in by another office was too strong

## 2019-10-27 NOTE — Telephone Encounter (Signed)
Pt has called Sandy,RN back please call re: his medication

## 2019-10-27 NOTE — Telephone Encounter (Addendum)
Drug registry checked last fill Hydrocodone 09-22-19 #45 Dr Epimenio Foot.  Had LAP APPENDECTOMY 10-15-19 received 10-15-19 #15 Oxy 5mg  take 1 every 6 hours prn severe pain. I called and LMVM for pt to return call.  If still treating post op pain, then may need to contact surgeon if this is for just renewal from then can send to Dr. [place].  Spoke to pt and he is finished with surgeon, not having pain from surgery.  Finished the oxycodone.   Just needs his regular monthly pain medication form Epimenio Foot hydrocodone.

## 2019-10-27 NOTE — Addendum Note (Signed)
Addended by: Guy Begin on: 10/27/2019 11:41 AM   Modules accepted: Orders

## 2019-11-06 ENCOUNTER — Telehealth: Payer: Self-pay | Admitting: Family Medicine

## 2019-11-06 NOTE — Telephone Encounter (Signed)
Pt is needing a refill on his gabapentin (NEURONTIN) 300 MG capsule [facility] [place] Pt is scheduled for an appt in October with MD

## 2019-11-09 MED ORDER — GABAPENTIN 300 MG PO CAPS: ORAL_CAPSULE | ORAL | 0 refills | Status: DC

## 2019-11-26 ENCOUNTER — Other Ambulatory Visit: Payer: Self-pay | Admitting: Family Medicine

## 2019-11-26 MED ORDER — HYDROCODONE-ACETAMINOPHEN 5-325 MG PO TABS: 1.0000 | ORAL_TABLET | ORAL | 0 refills | Status: DC | PRN

## 2019-11-26 NOTE — Telephone Encounter (Addendum)
Reviewed pt chart. We prescribe hydrocodone to pt. Appears per drug registry he received oxycodone from Harriette Bouillon, MD at [facility] on 10/15/19 #15. Last refilled hydrocodone 10/27/19 #45.  Last seen 07/22/19 and next f/u is 01/21/20. I called pt to further discuss. He states he had appendix removed and that is why a short supply of oxycodone was called in for him.

## 2019-11-26 NOTE — Telephone Encounter (Signed)
Pt request refill oxyCODONE (OXY IR/ROXICODONE) 5 MG immediate release [facility] [phone]

## 2019-11-26 NOTE — Addendum Note (Signed)
Addended by: Marcelina Morel L on: 11/26/2019 11:05 AM   Modules accepted: Orders

## 2019-12-24 ENCOUNTER — Other Ambulatory Visit: Payer: Self-pay | Admitting: Family Medicine

## 2019-12-24 NOTE — Telephone Encounter (Signed)
Pt is requesting a refill for HYDROcodone-acetaminophen (NORCO/VICODIN) 5-325 MG tablet .  [id number] 

## 2019-12-24 NOTE — Addendum Note (Signed)
Addended by: Arther Abbott on: 12/24/2019 05:06 PM   Modules accepted: Orders

## 2019-12-27 MED ORDER — HYDROCODONE-ACETAMINOPHEN 5-325 MG PO TABS: 1.0000 | ORAL_TABLET | ORAL | 0 refills | Status: DC | PRN

## 2019-12-27 NOTE — Addendum Note (Signed)
Addended byNicholas Lose, Tatem Holsonback L on: 12/27/2019 01:13 PM   Modules accepted: Orders

## 2020-01-20 ENCOUNTER — Encounter (INDEPENDENT_AMBULATORY_CARE_PROVIDER_SITE_OTHER): Payer: PRIVATE HEALTH INSURANCE | Admitting: Ophthalmology

## 2020-01-21 ENCOUNTER — Ambulatory Visit (INDEPENDENT_AMBULATORY_CARE_PROVIDER_SITE_OTHER): Payer: PRIVATE HEALTH INSURANCE | Admitting: Neurology

## 2020-01-21 ENCOUNTER — Encounter: Payer: Self-pay | Admitting: Neurology

## 2020-01-21 VITALS — BP 133/77 | HR 64 | Ht 70.0 in | Wt 221.0 lb

## 2020-01-21 DIAGNOSIS — N529 Male erectile dysfunction, unspecified: Secondary | ICD-10-CM

## 2020-01-21 DIAGNOSIS — G629 Polyneuropathy, unspecified: Secondary | ICD-10-CM

## 2020-01-21 DIAGNOSIS — G894 Chronic pain syndrome: Secondary | ICD-10-CM

## 2020-01-21 DIAGNOSIS — R208 Other disturbances of skin sensation: Secondary | ICD-10-CM

## 2020-01-21 DIAGNOSIS — M5417 Radiculopathy, lumbosacral region: Secondary | ICD-10-CM | POA: Diagnosis not present

## 2020-01-21 MED ORDER — SILDENAFIL CITRATE 100 MG PO TABS: ORAL_TABLET | ORAL | 11 refills | Status: DC

## 2020-01-21 MED ORDER — HYDROCODONE-ACETAMINOPHEN 5-325 MG PO TABS: 1.0000 | ORAL_TABLET | ORAL | 0 refills | Status: DC | PRN

## 2020-01-21 MED ORDER — MELOXICAM 7.5 MG PO TABS: 7.5000 mg | ORAL_TABLET | Freq: Every day | ORAL | 11 refills | Status: DC

## 2020-01-21 NOTE — Progress Notes (Signed)
[facility]  PATIENT: Jeffrey Good DOB: 04/06/1951  REFERRING DOCTOR OR PCP:  Donald Prose SOURCE: patient, records from Dr. Nancy Fetter  _________________________________   HISTORICAL  CHIEF COMPLAINT:  Chief Complaint  Patient presents with  . Follow-up    RM 13, alone. Last seen 07/22/19 by AL,NP. Here to f/u on polyneuropathy/chronic pain. Needs refill on meloxicam.    HISTORY OF PRESENT ILLNESS:  Jeffrey Good is a 69 yo man with burning dysesthesias and back pain.      Update 01/21/2019: He feels the polyneuropathy is stable.   He gets a burning sensation at times but not as bad as before the medication.     Lamotrigine 150 mg helped incompletely and he has done better with the addition of gabapentin.    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  The leg pain is fairly symmetric.   He has no weakness.   Pain is worse at night and when he first wakes up and sometimes after a long day of driving.Marland Kitchen  He sometimes needs a hydrocodone when pain is bothering him more.      He denies weakness in his legs.    He does not note bladder changes but has ED, helped by Viagra.     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    Hydrocodone has helped the neuropathic pain as well as the back pain.     The PDMP was reviewed and he is compliant.    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      History of dysesthesia:     Early 2017, he had the onset of a burning sensation in his feet.  He did not note any change in the color of most of the toes though the tip of the big toe did seem to be a mildly different color. Sometimes, he gets a sensation of swelling in the big toes. Initially, he was tried on Lyrica but had stomach upset and stopped. Then, he was tried on gabapentin. 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 He does not think he was on either of these medications long enough to know where the not he got a benefit.  Lamotrigine combined with hydrocodone has helped best..        Studies :    ESR, cryoglobulins, SPEP/IEF and ANA were normal.    An NCV/EMG was more consistent with mild S1 chronic radiculopathy. There was not evidence of a large fiber polyneuropathy, though a small fiber polyneuropathy is still possible.   REVIEW OF SYSTEMS: Constitutional: No fevers, chills, sweats, or change in appetite Eyes: No visual changes, double vision, eye pain Ear, nose and throat: No hearing loss, ear pain, nasal congestion, sore throat Cardiovascular: No chest pain, palpitations Respiratory: No shortness of breath at rest or with exertion.   No wheezes GastrointestinaI: No nausea, vomiting, diarrhea, abdominal pain, fecal incontinence Genitourinary: No dysuria, urinary retention or frequency.  No nocturia.  He has mild ED. Musculoskeletal: No neck pain, back pain Integumentary: No rash, pruritus, skin lesions Neurological: as above Psychiatric: No depression at this time.  No anxiety Endocrine: No palpitations, diaphoresis, change in appetite, change in weigh or increased thirst Hematologic/Lymphatic: No anemia, purpura, petechiae.   ALLERGIES: Allergies  Allergen Reactions  . Pregabalin Nausea Only  HOME MEDICATIONS:  Current Outpatient Medications:  .  acetaminophen (TYLENOL) 325 MG tablet, Take 2 tablets (650 mg total) by mouth every 6 (six) hours as needed for mild pain (or temp > 100)., Disp:  , Rfl:  .  carvedilol (COREG) 25 MG tablet, Take 1 tablet (25 mg total) by mouth 2 (two) times daily., Disp: 180 tablet, Rfl: 3 .  Cetirizine HCl (ZYRTEC ALLERGY) 10 MG CAPS, Take 10 mg by mouth daily. , Disp: , Rfl:  .  doxazosin (CARDURA XL) 4 MG 24 hr tablet, Take 1 tablet (4 mg total) by mouth daily with breakfast., Disp: 30 tablet, Rfl: 11 .  gabapentin (NEURONTIN) 300 MG capsule,  TAKE 1 CAPSULE BY MOUTH THREE TIMES A DAY, Disp: 270 capsule, Rfl: 0 .  HYDROcodone-acetaminophen (NORCO/VICODIN) 5-325 MG tablet, Take 1-2 tablets by mouth as needed for moderate pain., Disp: 45 tablet, Rfl: 0 .  ibuprofen (ADVIL) 800 MG tablet, Take 1 tablet (800 mg total) by mouth every 8 (eight) hours as needed., Disp: 30 tablet, Rfl: 0 .  lamoTRIgine (LAMICTAL) 150 MG tablet, TAKE 1 TABLET BY MOUTH TWICE A DAY, Disp: 180 tablet, Rfl: 3 .  lisinopril (ZESTRIL) 20 MG tablet, Take 1 tablet (20 mg total) by mouth 2 (two) times daily., Disp: 180 tablet, Rfl: 3 .  meloxicam (MOBIC) 7.5 MG tablet, Take 1 tablet (7.5 mg total) by mouth daily., Disp: 30 tablet, Rfl: 11 .  sildenafil (VIAGRA) 100 MG tablet, One half to one po prn before intercourse, Disp: 10 tablet, Rfl: 11 .  spironolactone (ALDACTONE) 25 MG tablet, TAKE 1 TABLET BY MOUTH ONCE DAILY, Disp: 90 tablet, Rfl: 3  PAST MEDICAL HISTORY: Past Medical History:  Diagnosis Date  . Erectile dysfunction   . Hyperlipidemia   . Hypertension   . Seasonal allergies     PAST SURGICAL HISTORY: Past Surgical History:  Procedure Laterality Date  . IR RADIOLOGIST EVAL & MGMT  07/16/2019  . IR RADIOLOGIST EVAL & MGMT  07/29/2019  . KNEE ARTHROSCOPY     1989 left  . LAPAROSCOPIC APPENDECTOMY N/A 10/15/2019   Procedure: LAPAROSCOPIC APPENDECTOMY;  Surgeon: Erroll Luna, MD;  Location: [facility] OR;  Service: General;  Laterality: N/A;    FAMILY HISTORY: Family History  Problem Relation Age of Onset  . Hypertension Mother   . Diabetes Mother     SOCIAL HISTORY:  Social History   Socioeconomic History  . Marital status: Married    Spouse name: Not on file  . Number of children: Not on file  . Years of education: Not on file  . Highest education level: Not on file  Occupational History  . Not on file  Tobacco Use  . Smoking status: Never Smoker  . Smokeless tobacco: Never Used  Vaping Use  . Vaping Use: Never used  Substance and Sexual  Activity  . Alcohol use: Yes    Alcohol/week: 1.0 standard drink    Types: 1 Cans of beer per week    Comment: very rare  . Drug use: No  . Sexual activity: Yes  Other Topics Concern  . Not on file  Social History Narrative  . Not on file   Social Determinants of Health   Financial Resource Strain:   . Difficulty of Paying Living Expenses: Not on file  Food Insecurity:   . Worried About Charity fundraiser in the Last Year: Not on file  . Ran Out of Food in the Last Year: Not on file  Transportation Needs:   . Film/video editor (Medical): Not on file  . Lack of Transportation (Non-Medical): Not on file  Physical Activity:   . Days of Exercise per Week: Not on file  . Minutes of Exercise per Session: Not on file  Stress:   . Feeling of Stress : Not on file  Social Connections:   . Frequency of Communication with Friends and Family: Not on file  . Frequency of Social Gatherings with Friends and Family: Not on file  . Attends Religious Services: Not on file  . Active Member of Clubs or Organizations: Not on file  . Attends Archivist Meetings: Not on file  . Marital Status: Not on file  Intimate Partner Violence:   . Fear of Current or Ex-Partner: Not on file  . Emotionally Abused: Not on file  . Physically Abused: Not on file  . Sexually Abused: Not on file     PHYSICAL EXAM  Vitals:   01/21/20 1427  BP: 133/77  Pulse: 64  Weight: 221 lb (100.2 kg)  Height: _0  (1.778 m)    Body mass index is 31.71 kg/m.   General: The patient is well-developed and well-nourished and in no acute distress  Back:   The back is nontender. Range of motion is slightly reduced.  Neurologic Exam  Mental status: The patient is alert and oriented x 3 at the time of the examination. The patient has apparent normal recent and remote memory, with an apparently normal attention span and concentration ability.   Speech is normal.  Cranial nerves: Extraocular movements  are full.  Facial strength was normal.  Hearing appeared to be normal and symmetric.  Motor:  Muscle bulk is normal.   Muscle tone is normal.  Strength was 5/5 in the arms and legs including the feet.  Sensory:  He has intact sensation to touch and temperature and vibration in the arms.  He has normal vibration sensation at the knees and ankles but reduced sensation of the toes.  He has reduced touch sensation at the toes but normal sensation above the ankles.    Coordination: Cerebellar testing reveals good finger-nose-finger and heel-to-shin bilaterally.  Gait and station: Station is normal.  Gait and tandem gait are normal.. Romberg is  negative.   Reflexes: Deep tendon reflexes are symmetric and normal bilaterally.      DIAGNOSTIC DATA (LABS, IMAGING, TESTING) - I reviewed patient records, labs, notes, testing and imaging myself where available.  Lab Results  Component Value Date   WBC 5.1 10/15/2019   HGB 11.9 (L) 10/15/2019   HCT 38.6 (L) 10/15/2019   MCV 90.0 10/15/2019   PLT 158 10/15/2019      Component Value Date/Time   NA 139 10/15/2019 0629   K 4.2 10/15/2019 0629   CL 105 10/15/2019 0629   CO2 26 10/15/2019 0629   GLUCOSE 97 10/15/2019 0629   BUN 17 10/15/2019 0629   CREATININE 1.00 10/15/2019 0629   CREATININE 1.16 04/05/2016 1408   CALCIUM 9.2 10/15/2019 0629   PROT 7.3 10/15/2019 0629   PROT 7.3 11/16/2014 1635   ALBUMIN 3.6 10/15/2019 0629   AST 20 10/15/2019 0629   ALT 22 10/15/2019 0629   ALKPHOS 58 10/15/2019 0629   BILITOT 0.4 10/15/2019 0629   GFRNONAA >60 10/15/2019 0629   GFRAA >60 10/15/2019 0629   Lab Results  Component Value Date   CHOL 126 09/01/2014   HDL 34.00 (L) 09/01/2014   LDLCALC 71 09/01/2014  TRIG 103.0 09/01/2014   CHOLHDL 4 09/01/2014       ASSESSMENT AND PLAN  Polyneuropathy  Chronic pain syndrome  Lumbosacral radiculopathy at S1  Dysesthesia  Erectile dysfunction, unspecified erectile dysfunction  type   1.   Continue lamotrigine 150 mg p.o. twice daily for the neuropathic pain.   2.   Continue Hydrocodone 1-2 at bedtime for neuropathy and back pain.    45 pills needs to last 30 days.   NCCSRS reviewed and he is compliant.   He has never had drug seeking behavior.   3.   Renew Viagra as needed for ED 3.   rtc 6 mos or sooner if  new or worsening symptoms   Mi Balla A. Felecia Shelling, MD, PhD 28/76/8115, 7:26 PM Certified in Neurology, Clinical Neurophysiology, Sleep Medicine, Pain Medicine and Neuroimaging  [facility] [address], [place],  [address] [phone] =

## 2020-01-25 NOTE — Progress Notes (Signed)
[facility] - Clinic Note  01/26/2020     CHIEF COMPLAINT Patient presents for Retina Evaluation   HISTORY OF PRESENT ILLNESS: Jeffrey Good is a 69 y.o. male who presents to the clinic today for:   HPI    Retina Evaluation    I, the attending physician,  performed the HPI with the patient and updated documentation appropriately.          Comments    Retina Eval per Dr. Conley Rolls- Floaters, Ischemia, Heme OU Pt states Dr. Conley Rolls noticed blood in the back of OD.  He had noticed a "shadow" that is constant in his eye OD X2 weeks ago.  It is stationary.  Vision appears stable.  Denies FOLs.         Last edited by Rennis Chris, MD on 01/26/2020  1:28 PM. (History)    pt started seeing new floaters in his right eye only about 3 weeks ago, he denies seeing any fol, pt saw Dr. Conley Rolls who said she saw blood in the back of his eye and referred him here, pt states he is on medication for BP  Referring physician: Conley Rolls [place], [place] [address] [place],  [place] [address]  HISTORICAL INFORMATION:   Selected notes from the MEDICAL RECORD NUMBER Referred by Dr. Conley Rolls for eval of floaters/ischemia/heme OU.   CURRENT MEDICATIONS: No current outpatient medications on file. (Ophthalmic Drugs)   No current facility-administered medications for this visit. (Ophthalmic Drugs)   Current Outpatient Medications (Other)  Medication Sig  . acetaminophen (TYLENOL) 325 MG tablet Take 2 tablets (650 mg total) by mouth every 6 (six) hours as needed for mild pain (or temp > 100).  . carvedilol (COREG) 25 MG tablet Take 1 tablet (25 mg total) by mouth 2 (two) times daily.  . Cetirizine HCl (ZYRTEC ALLERGY) 10 MG CAPS Take 10 mg by mouth daily.   Marland Kitchen doxazosin (CARDURA XL) 4 MG 24 hr tablet Take 1 tablet (4 mg total) by mouth daily with breakfast.  . gabapentin (NEURONTIN) 300 MG capsule TAKE 1 CAPSULE BY MOUTH THREE TIMES A DAY  . HYDROcodone-acetaminophen (NORCO/VICODIN) 5-325 MG tablet Take  1-2 tablets by mouth as needed for moderate pain.  Marland Kitchen ibuprofen (ADVIL) 800 MG tablet Take 1 tablet (800 mg total) by mouth every 8 (eight) hours as needed.  . lamoTRIgine (LAMICTAL) 150 MG tablet TAKE 1 TABLET BY MOUTH TWICE A DAY  . lisinopril (ZESTRIL) 20 MG tablet Take 1 tablet (20 mg total) by mouth 2 (two) times daily.  . meloxicam (MOBIC) 7.5 MG tablet Take 1 tablet (7.5 mg total) by mouth daily.  . sildenafil (VIAGRA) 100 MG tablet One half to one po prn before intercourse  . spironolactone (ALDACTONE) 25 MG tablet TAKE 1 TABLET BY MOUTH ONCE DAILY   No current facility-administered medications for this visit. (Other)      REVIEW OF SYSTEMS: ROS    Positive for: Musculoskeletal, Eyes, Allergic/Imm   Negative for: Constitutional, Gastrointestinal, Neurological, Skin, Genitourinary, HENT, Endocrine, Cardiovascular, Respiratory, Psychiatric, Heme/Lymph   Last edited by Joni Reining, COA on 01/26/2020  1:13 PM. (History)       ALLERGIES Allergies  Allergen Reactions  . Pregabalin Nausea Only    PAST MEDICAL HISTORY Past Medical History:  Diagnosis Date  . Erectile dysfunction   . Hyperlipidemia   . Hypertension   . Seasonal allergies    Past Surgical History:  Procedure Laterality Date  . IR RADIOLOGIST EVAL &  MGMT  07/16/2019  . IR RADIOLOGIST EVAL & MGMT  07/29/2019  . KNEE ARTHROSCOPY     1989 left  . LAPAROSCOPIC APPENDECTOMY N/A 10/15/2019   Procedure: LAPAROSCOPIC APPENDECTOMY;  Surgeon: Harriette Bouillon, MD;  Location: [facility] OR;  Service: General;  Laterality: N/A;    FAMILY HISTORY Family History  Problem Relation Age of Onset  . Hypertension Mother   . Diabetes Mother     SOCIAL HISTORY Social History   Tobacco Use  . Smoking status: Never Smoker  . Smokeless tobacco: Never Used  Vaping Use  . Vaping Use: Never used  Substance Use Topics  . Alcohol use: Yes    Alcohol/week: 1.0 standard drink    Types: 1 Cans of beer per week    Comment: very rare   . Drug use: No         OPHTHALMIC EXAM:  Base Eye Exam    Visual Acuity (Snellen - Linear)      Right Left   Dist cc 20/25 +1 20/20   Dist ph cc 20/20        Tonometry (Tonopen, 1:26 PM)      Right Left   Pressure 13 15       Pupils      Dark Light Shape React APD   Right 3 2 Round Brisk None   Left 3 2 Round Brisk None       Visual Fields (Counting fingers)      Left Right    Full Full       Extraocular Movement      Right Left    Full Full       Neuro/Psych    Oriented x3: Yes   Mood/Affect: Normal       Dilation    Both eyes: 1.0% Mydriacyl, 2.5% Phenylephrine @ 1:26 PM        Slit Lamp and Fundus Exam    Slit Lamp Exam      Right Left   Lids/Lashes Dermatochalasis - upper lid Dermatochalasis - upper lid   Conjunctiva/Sclera mild Melanosis nasal and temporal pinguecula, mild melanosis   Cornea arcus, mild Debris in tear film arcus, mild Debris in tear film   Anterior Chamber Deep and quiet Deep and quiet   Iris Round and moderately dilated Round and moderately dilated   Lens 2+ Nuclear sclerosis, 2+ Cortical cataract 2+ Nuclear sclerosis, 2+ Cortical cataract   Vitreous Vitreous syneresis, Posterior vitreous detachment, vitreous condensations Vitreous syneresis       Fundus Exam      Right Left   Disc Pink and Sharp, Compact, corkscrew vessel nasally Pink and Sharp, Compact   C/D Ratio 0.2 0.2   Macula Flat, Good foveal reflex, mild RPE mottling, No heme or edema Flat, Good foveal reflex, mild RPE mottling, No heme or edema   Vessels attenuated, mild tortuousity attenuated, Tortuous, mild Copper wiring   Periphery Attached, peripheral drusen, sclerotic vessels nasal periphery Attached, focal punctate CR atrophy superior to disc           Refraction    Wearing Rx      Sphere Cylinder Axis Add   Right +0.25 +1.25 125 +2.75   Left -0.25 +1.50 075 +2.75       Manifest Refraction      Sphere Cylinder Axis Dist VA   Right -0.75 +0.75 115  20/20   Left -0.50 +1.25 075 20/20          IMAGING AND PROCEDURES  Imaging and Procedures for 01/26/2020  OCT, Retina - OU - Both Eyes       Right Eye Quality was good. Central Foveal Thickness: 282. Progression has no prior data. Findings include normal foveal contour, no IRF, no SRF (Mild vitreous opacities).   Left Eye Quality was good. Central Foveal Thickness: 276. Progression has no prior data. Findings include normal foveal contour, no IRF, no SRF, vitreomacular adhesion .   Notes *Images captured and stored on drive  Diagnosis / Impression:  NFP, no IRF/SRF OU  Clinical management:  See below  Abbreviations: NFP - Normal foveal profile. CME - cystoid macular edema. PED - pigment epithelial detachment. IRF - intraretinal fluid. SRF - subretinal fluid. EZ - ellipsoid zone. ERM - epiretinal membrane. ORA - outer retinal atrophy. ORT - outer retinal tubulation. SRHM - subretinal hyper-reflective material. IRHM - intraretinal hyper-reflective material        Fluorescein Angiography Optos (Transit OD)       Right Eye   Progression has no prior data. Early phase findings include delayed filling, vascular perfusion defect, microaneurysm (telangectasia's ). Mid/Late phase findings include vascular perfusion defect, microaneurysm, leakage (Telangectasia's).   Left Eye   Progression has no prior data. Early phase findings include microaneurysm. Mid/Late phase findings include microaneurysm.   Notes **Images stored on drive**  Impression: OD: ST peripheral quad with Telangiectatic vessels, vascular non-perfusion and late leakage -- remote BRVO OS: +MAs                  ASSESSMENT/PLAN:    ICD-10-CM   1. Posterior vitreous detachment of right eye  H43.811   2. Retinal ischemia  H35.82   3. 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 Retinal edema  H35.81 OCT, Retina - OU - Both Eyes  5. Essential hypertension  I10   6.  Hypertensive retinopathy of both eyes  H35.033 Fluorescein Angiography Optos (Transit OD)  7. Combined forms of age-related cataract of both eyes  H25.813     1. PVD / vitreous syneresis OD  - 3 wk history of floaters OD, onset ~1st of Oct 2021  - Discussed findings and prognosis  - No RT or RD on 360 peripheral exam  - Reviewed s/s of RT/RD  - Strict return precautions for any such RT/RD signs/symptoms  - f/u in 3-4 wks -- DFE/OCT  2,3. Retinal Ischemia, remote BRVO OD  - exam shows sclerotic vessels in SN peripheral quad  - FA 10.19.21 shows telangectasias w/ late leakge, vascular perfusion defects in superonasal peripheral quad -- suggestive of remote BRVO   - optic disc with nasal corkscrew vessel  - discussed findings, prognosis  - no retinal intervention indicated at this time, but pt may benefit from segmental PRP to areas of nonperfusion OD  - monitor  4. No retinal edema on exam or OCT  5,6. Hypertensive retinopathy OU - discussed importance of tight BP control - monitor  7. Mixed Cataract OU - The symptoms of cataract, surgical options, and treatments and risks were discussed with patient. - discussed diagnosis and progression - monitor  Ophthalmic Meds Ordered this visit:  No orders of the defined types were placed in this encounter.      Return for f/u 3-4 weeks, PVD OD, DFE, OCT.  There are no Patient Instructions on file for this visit.   Explained the diagnoses, plan, and follow up with the patient and they expressed understanding.  Patient expressed understanding of  the importance of proper follow up care.   This document serves as a record of services personally performed by Karie Chimera, MD, PhD. It was created on their behalf by Cristopher Estimable, COT an ophthalmic technician. The creation of this record is the provider's dictation and/or activities during the visit.    Electronically signed by: Cristopher Estimable, COT 10.18.21 @ 5:09 PM   This document  serves as a record of services personally performed by Karie Chimera, MD, PhD. It was created on their behalf by Glee Arvin. Manson Passey, OA an ophthalmic technician. The creation of this record is the provider's dictation and/or activities during the visit.    Electronically signed by: Glee Arvin. Manson Passey, [place] 10.19.2021 5:09 PM   Karie Chimera, M.D., Ph.D. Diseases & Surgery of the Retina and Vitreous [facility]  I have reviewed the above documentation for accuracy and completeness, and I agree with the above. Karie Chimera, M.D., Ph.D. 01/26/20 5:09 PM   Abbreviations: M myopia (nearsighted); A astigmatism; H hyperopia (farsighted); P presbyopia; Mrx spectacle prescription;  CTL contact lenses; OD right eye; OS left eye; OU both eyes  XT exotropia; ET esotropia; PEK punctate epithelial keratitis; PEE punctate epithelial erosions; DES dry eye syndrome; MGD meibomian gland dysfunction; ATs artificial tears; PFAT's preservative free artificial tears; NSC nuclear sclerotic cataract; PSC posterior subcapsular cataract; ERM epi-retinal membrane; PVD posterior vitreous detachment; RD retinal detachment; DM diabetes mellitus; DR diabetic retinopathy; NPDR non-proliferative diabetic retinopathy; PDR proliferative diabetic retinopathy; CSME clinically significant macular edema; DME diabetic macular edema; dbh dot blot hemorrhages; CWS cotton wool spot; POAG primary open angle glaucoma; C/D cup-to-disc ratio; HVF humphrey visual field; GVF goldmann visual field; OCT optical coherence tomography; IOP intraocular pressure; BRVO Branch retinal vein occlusion; CRVO central retinal vein occlusion; CRAO central retinal artery occlusion; BRAO branch retinal artery occlusion; RT retinal tear; SB scleral buckle; PPV pars plana vitrectomy; VH Vitreous hemorrhage; PRP panretinal laser photocoagulation; IVK intravitreal kenalog; VMT vitreomacular traction; MH Macular hole;  NVD neovascularization of the  disc; NVE neovascularization elsewhere; AREDS age related eye disease study; ARMD age related macular degeneration; POAG primary open angle glaucoma; EBMD epithelial/anterior basement membrane dystrophy; ACIOL anterior chamber intraocular lens; IOL intraocular lens; PCIOL posterior chamber intraocular lens; Phaco/IOL phacoemulsification with intraocular lens placement; PRK photorefractive keratectomy; LASIK laser assisted in situ keratomileusis; HTN hypertension; DM diabetes mellitus; COPD chronic obstructive pulmonary disease

## 2020-01-26 ENCOUNTER — Other Ambulatory Visit: Payer: Self-pay

## 2020-01-26 ENCOUNTER — Ambulatory Visit (INDEPENDENT_AMBULATORY_CARE_PROVIDER_SITE_OTHER): Payer: PRIVATE HEALTH INSURANCE | Admitting: Ophthalmology

## 2020-01-26 ENCOUNTER — Encounter (INDEPENDENT_AMBULATORY_CARE_PROVIDER_SITE_OTHER): Payer: Self-pay | Admitting: Ophthalmology

## 2020-01-26 DIAGNOSIS — H3582 Retinal ischemia: Secondary | ICD-10-CM | POA: Diagnosis not present

## 2020-01-26 DIAGNOSIS — H43811 Vitreous degeneration, right eye: Secondary | ICD-10-CM | POA: Diagnosis not present

## 2020-01-26 DIAGNOSIS — H348312 Tributary (branch) retinal vein occlusion, right eye, stable: Secondary | ICD-10-CM

## 2020-01-26 DIAGNOSIS — H35033 Hypertensive retinopathy, bilateral: Secondary | ICD-10-CM | POA: Diagnosis not present

## 2020-01-26 DIAGNOSIS — I10 Essential (primary) hypertension: Secondary | ICD-10-CM

## 2020-01-26 DIAGNOSIS — H3581 Retinal edema: Secondary | ICD-10-CM

## 2020-01-26 DIAGNOSIS — H25813 Combined forms of age-related cataract, bilateral: Secondary | ICD-10-CM

## 2020-02-17 ENCOUNTER — Encounter (INDEPENDENT_AMBULATORY_CARE_PROVIDER_SITE_OTHER): Payer: PRIVATE HEALTH INSURANCE | Admitting: Ophthalmology

## 2020-02-17 DIAGNOSIS — I10 Essential (primary) hypertension: Secondary | ICD-10-CM

## 2020-02-17 DIAGNOSIS — H3582 Retinal ischemia: Secondary | ICD-10-CM

## 2020-02-17 DIAGNOSIS — H3581 Retinal edema: Secondary | ICD-10-CM

## 2020-02-17 DIAGNOSIS — H25813 Combined forms of age-related cataract, bilateral: Secondary | ICD-10-CM

## 2020-02-17 DIAGNOSIS — H43811 Vitreous degeneration, right eye: Secondary | ICD-10-CM

## 2020-02-17 DIAGNOSIS — H35033 Hypertensive retinopathy, bilateral: Secondary | ICD-10-CM

## 2020-02-17 DIAGNOSIS — H348312 Tributary (branch) retinal vein occlusion, right eye, stable: Secondary | ICD-10-CM

## 2020-02-22 ENCOUNTER — Other Ambulatory Visit: Payer: Self-pay | Admitting: Neurology

## 2020-02-22 MED ORDER — GABAPENTIN 300 MG PO CAPS: ORAL_CAPSULE | ORAL | 1 refills | Status: DC

## 2020-02-22 MED ORDER — HYDROCODONE-ACETAMINOPHEN 5-325 MG PO TABS: 1.0000 | ORAL_TABLET | ORAL | 0 refills | Status: DC | PRN

## 2020-02-22 NOTE — Telephone Encounter (Signed)
Pt is needing his HYDROcodone-acetaminophen (NORCO/VICODIN) 5-325 MG tablet and his gabapentin (NEURONTIN) 300 MG capsule called in for him to the [facility] on [address].

## 2020-02-23 NOTE — Progress Notes (Signed)
[facility] - Clinic Note  02/24/2020     CHIEF COMPLAINT Patient presents for Retina Follow Up   HISTORY OF PRESENT ILLNESS: Jeffrey Good is a 69 y.o. male who presents to the clinic today for:   HPI    Retina Follow Up    Patient presents with  PVD.  In right eye.  This started 3.5 weeks ago.  Severity is mild.  Duration of 3.5 weeks.  Since onset it is stable.  I, the attending physician,  performed the HPI with the patient and updated documentation appropriately.          Comments    69 y/o male pt here for 3.5 wk f/u for PVD/retinal ischemia-remote BRVO OD.  No change in [place] OU.  Is less "aware" of floaters OD.  Denies pain, FOL.  No gtts.       Last edited by Rennis Chris, MD on 02/24/2020  2:26 PM. (History)    pt states he has gotten used to the floaters in his vision and they are not as bothersome  Referring physician: Deatra James, MD [phone] [address] [place],  [place] [address]  HISTORICAL INFORMATION:   Selected notes from the MEDICAL RECORD NUMBER Referred by Dr. Conley Rolls for eval of floaters/ischemia/heme OU.   CURRENT MEDICATIONS: No current outpatient medications on file. (Ophthalmic Drugs)   No current facility-administered medications for this visit. (Ophthalmic Drugs)   Current Outpatient Medications (Other)  Medication Sig  . acetaminophen (TYLENOL) 325 MG tablet Take 2 tablets (650 mg total) by mouth every 6 (six) hours as needed for mild pain (or temp > 100).  . carvedilol (COREG) 25 MG tablet Take 1 tablet (25 mg total) by mouth 2 (two) times daily.  . Cetirizine HCl (ZYRTEC ALLERGY) 10 MG CAPS Take 10 mg by mouth daily.   Marland Kitchen doxazosin (CARDURA XL) 4 MG 24 hr tablet Take 1 tablet (4 mg total) by mouth daily with breakfast.  . gabapentin (NEURONTIN) 300 MG capsule TAKE 1 CAPSULE BY MOUTH THREE TIMES A DAY  . HYDROcodone-acetaminophen (NORCO/VICODIN) 5-325 MG tablet Take 1-2 tablets by mouth as needed for moderate pain.  Marland Kitchen  ibuprofen (ADVIL) 800 MG tablet Take 1 tablet (800 mg total) by mouth every 8 (eight) hours as needed.  . lamoTRIgine (LAMICTAL) 150 MG tablet TAKE 1 TABLET BY MOUTH TWICE A DAY  . lisinopril (ZESTRIL) 20 MG tablet Take 1 tablet (20 mg total) by mouth 2 (two) times daily.  . meloxicam (MOBIC) 7.5 MG tablet Take 1 tablet (7.5 mg total) by mouth daily.  . sildenafil (VIAGRA) 100 MG tablet One half to one po prn before intercourse  . spironolactone (ALDACTONE) 25 MG tablet TAKE 1 TABLET BY MOUTH ONCE DAILY  . sulfamethoxazole-trimethoprim (BACTRIM DS) 800-160 MG tablet Take 1 tablet by mouth 2 (two) times daily.   No current facility-administered medications for this visit. (Other)      REVIEW OF SYSTEMS: ROS    Positive for: Eyes   Negative for: Constitutional, Gastrointestinal, Neurological, Skin, Genitourinary, Musculoskeletal, HENT, Endocrine, Cardiovascular, Respiratory, Psychiatric, Allergic/Imm, Heme/Lymph   Last edited by Celine Mans, COA on 02/24/2020  1:55 PM. (History)       ALLERGIES Allergies  Allergen Reactions  . Pregabalin Nausea Only    PAST MEDICAL HISTORY Past Medical History:  Diagnosis Date  . Cataract    Mixed form OU  . Erectile dysfunction   . Hyperlipidemia   . Hypertension   .  Hypertensive retinopathy    OU  . Seasonal allergies    Past Surgical History:  Procedure Laterality Date  . IR RADIOLOGIST EVAL & MGMT  07/16/2019  . IR RADIOLOGIST EVAL & MGMT  07/29/2019  . KNEE ARTHROSCOPY     1989 left  . LAPAROSCOPIC APPENDECTOMY N/A 10/15/2019   Procedure: LAPAROSCOPIC APPENDECTOMY;  Surgeon: Harriette Bouillon, MD;  Location: [facility] OR;  Service: General;  Laterality: N/A;    FAMILY HISTORY Family History  Problem Relation Age of Onset  . Hypertension Mother   . Diabetes Mother     SOCIAL HISTORY Social History   Tobacco Use  . Smoking status: Never Smoker  . Smokeless tobacco: Never Used  Vaping Use  . Vaping Use: Never used  Substance  Use Topics  . Alcohol use: Yes    Alcohol/week: 1.0 standard drink    Types: 1 Cans of beer per week    Comment: very rare  . Drug use: No         OPHTHALMIC EXAM:  Base Eye Exam    Visual Acuity (Snellen - Linear)      Right Left   Dist [place] 20/20 -2 20/20       Tonometry (Tonopen, 1:58 PM)      Right Left   Pressure 17 15  Squeezing       Pupils      Dark Light Shape React APD   Right 3 2 Round Brisk None   Left 3 2 Round Brisk None       Visual Fields (Counting fingers)      Left Right     Full       Extraocular Movement      Right Left    Full, Ortho Full, Ortho       Neuro/Psych    Oriented x3: Yes   Mood/Affect: Normal       Dilation    Both eyes: 1.0% Mydriacyl, 2.5% Phenylephrine @ 1:58 PM        Slit Lamp and Fundus Exam    Slit Lamp Exam      Right Left   Lids/Lashes Dermatochalasis - upper lid Dermatochalasis - upper lid   Conjunctiva/Sclera mild Melanosis nasal and temporal pinguecula, mild melanosis   Cornea arcus, mild Debris in tear film arcus, mild Debris in tear film, 1+ Punctate epithelial erosions   Anterior Chamber Deep and quiet Deep and quiet   Iris Round and moderately dilated Round and moderately dilated   Lens 2+ Nuclear sclerosis, 2+ Cortical cataract 2+ Nuclear sclerosis, 2+ Cortical cataract   Vitreous Vitreous syneresis, Posterior vitreous detachment, mild improvement in vitreous condensations Vitreous syneresis       Fundus Exam      Right Left   Disc Pink and Sharp, Compact, corkscrew vessel nasally Pink and Sharp, Compact, focal temporal PPP   C/D Ratio 0.2 0.2   Macula Flat, Good foveal reflex, mild RPE mottling, No heme or edema Flat, Good foveal reflex, mild RPE mottling, No heme or edema   Vessels attenuated, mild tortuousity, sclerosis SN peripheray attenuated, Tortuous, mild Copper wiring   Periphery Attached, peripheral drusen, sclerotic vessels nasal periphery, No heme, focal blot hemes and punctate exudates  SN quad, No RT/RD Attached, focal punctate CR atrophy superior to disc             IMAGING AND PROCEDURES  Imaging and Procedures for 02/24/2020  OCT, Retina - OU - Both Eyes  Right Eye Quality was good. Central Foveal Thickness: 281. Progression has improved. Findings include normal foveal contour, no IRF, no SRF (Mild improvement in vitreous opacities).   Left Eye Quality was good. Central Foveal Thickness: 276. Progression has been stable. Findings include normal foveal contour, no IRF, no SRF, vitreomacular adhesion .   Notes *Images captured and stored on drive  Diagnosis / Impression:  NFP, no IRF/SRF OU OD: interval improvement in vitreous opacities  Clinical management:  See below  Abbreviations: NFP - Normal foveal profile. CME - cystoid macular edema. PED - pigment epithelial detachment. IRF - intraretinal fluid. SRF - subretinal fluid. EZ - ellipsoid zone. ERM - epiretinal membrane. ORA - outer retinal atrophy. ORT - outer retinal tubulation. SRHM - subretinal hyper-reflective material. IRHM - intraretinal hyper-reflective material                 ASSESSMENT/PLAN:    ICD-10-CM   1. Posterior vitreous detachment of right eye  H43.811   2. Retinal ischemia  H35.82   3. 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 Retinal edema  H35.81 OCT, Retina - OU - Both Eyes  5. Essential hypertension  I10   6. Hypertensive retinopathy of both eyes  H35.033   7. Combined forms of age-related cataract of both eyes  H25.813     1. PVD / vitreous syneresis OD  - 3 wk history of floaters OD, onset ~1st of Oct 2021  - symptoms improving  - Discussed findings and prognosis  - No RT or RD on 360 peripheral exam  - Reviewed s/s of RT/RD  - Strict return precautions for any such RT/RD signs/symptoms  - f/u week of December 6 -- DFE/OCT  2,3. Retinal Ischemia, remote BRVO OD  - exam shows sclerotic vessels in SN peripheral quad  - FA 10.19.21  shows telangectasias w/ late leakge, vascular perfusion defects in superonasal peripheral quad -- suggestive of remote BRVO   - optic disc with nasal corkscrew vessel  - discussed findings, prognosis  - no retinal intervention indicated at this time, but pt may benefit from segmental PRP to areas of nonperfusion OD  - recommend PRP OD in Dec  - f/u week of December 6, DFE, OCT, PRP OD  4. No retinal edema on exam or OCT  5,6. Hypertensive retinopathy OU - discussed importance of tight BP control - monitor  7. Mixed Cataract OU - The symptoms of cataract, surgical options, and treatments and risks were discussed with patient. - discussed diagnosis and progression - monitor  Ophthalmic Meds Ordered this visit:  No orders of the defined types were placed in this encounter.      Return for f/u week of december 6, BRVO OD, DFE, OCT.  There are no Patient Instructions on file for this visit.  This document serves as a record of services personally performed by Karie ChimeraBrian G. Malia Corsi, MD, PhD. It was created on their behalf by Herby AbrahamAshley English, COA, an ophthalmic technician. The creation of this record is the provider's dictation and/or activities during the visit.    Electronically signed by: Herby AbrahamAshley English, COA 11.16.2021 9:54 PM   This document serves as a record of services personally performed by Karie ChimeraBrian G. Ameera Tigue, MD, PhD. It was created on their behalf by Glee ArvinAmanda J. Manson PasseyBrown, OA an ophthalmic technician. The creation of this record is the provider's dictation and/or activities during the visit.    Electronically signed by: Glee ArvinAmanda J. [place], [place] 11.17.2021 9:54  PM  Karie Chimera, M.D., Ph.D. Diseases & Surgery of the Retina and Vitreous [facility] 02/24/2020   I have reviewed the above documentation for accuracy and completeness, and I agree with the above. Karie Chimera, M.D., Ph.D. 02/24/20 9:54 PM   Abbreviations: M myopia (nearsighted); A astigmatism; H  hyperopia (farsighted); P presbyopia; Mrx spectacle prescription;  CTL contact lenses; OD right eye; OS left eye; OU both eyes  XT exotropia; ET esotropia; PEK punctate epithelial keratitis; PEE punctate epithelial erosions; DES dry eye syndrome; MGD meibomian gland dysfunction; ATs artificial tears; PFAT's preservative free artificial tears; NSC nuclear sclerotic cataract; PSC posterior subcapsular cataract; ERM epi-retinal membrane; PVD posterior vitreous detachment; RD retinal detachment; DM diabetes mellitus; DR diabetic retinopathy; NPDR non-proliferative diabetic retinopathy; PDR proliferative diabetic retinopathy; CSME clinically significant macular edema; DME diabetic macular edema; dbh dot blot hemorrhages; CWS cotton wool spot; POAG primary open angle glaucoma; C/D cup-to-disc ratio; HVF humphrey visual field; GVF goldmann visual field; OCT optical coherence tomography; IOP intraocular pressure; BRVO Branch retinal vein occlusion; CRVO central retinal vein occlusion; CRAO central retinal artery occlusion; BRAO branch retinal artery occlusion; RT retinal tear; SB scleral buckle; PPV pars plana vitrectomy; VH Vitreous hemorrhage; PRP panretinal laser photocoagulation; IVK intravitreal kenalog; VMT vitreomacular traction; MH Macular hole;  NVD neovascularization of the disc; NVE neovascularization elsewhere; AREDS age related eye disease study; ARMD age related macular degeneration; POAG primary open angle glaucoma; EBMD epithelial/anterior basement membrane dystrophy; ACIOL anterior chamber intraocular lens; IOL intraocular lens; PCIOL posterior chamber intraocular lens; Phaco/IOL phacoemulsification with intraocular lens placement; PRK photorefractive keratectomy; LASIK laser assisted in situ keratomileusis; HTN hypertension; DM diabetes mellitus; COPD chronic obstructive pulmonary disease

## 2020-02-24 ENCOUNTER — Encounter (INDEPENDENT_AMBULATORY_CARE_PROVIDER_SITE_OTHER): Payer: Self-pay | Admitting: Ophthalmology

## 2020-02-24 ENCOUNTER — Other Ambulatory Visit: Payer: Self-pay

## 2020-02-24 ENCOUNTER — Ambulatory Visit (INDEPENDENT_AMBULATORY_CARE_PROVIDER_SITE_OTHER): Payer: PRIVATE HEALTH INSURANCE | Admitting: Ophthalmology

## 2020-02-24 DIAGNOSIS — I10 Essential (primary) hypertension: Secondary | ICD-10-CM

## 2020-02-24 DIAGNOSIS — H3582 Retinal ischemia: Secondary | ICD-10-CM | POA: Diagnosis not present

## 2020-02-24 DIAGNOSIS — H35033 Hypertensive retinopathy, bilateral: Secondary | ICD-10-CM

## 2020-02-24 DIAGNOSIS — H3581 Retinal edema: Secondary | ICD-10-CM | POA: Diagnosis not present

## 2020-02-24 DIAGNOSIS — H43811 Vitreous degeneration, right eye: Secondary | ICD-10-CM | POA: Diagnosis not present

## 2020-02-24 DIAGNOSIS — H348312 Tributary (branch) retinal vein occlusion, right eye, stable: Secondary | ICD-10-CM | POA: Diagnosis not present

## 2020-02-24 DIAGNOSIS — H25813 Combined forms of age-related cataract, bilateral: Secondary | ICD-10-CM

## 2020-03-14 NOTE — Progress Notes (Deleted)
[facility] - Clinic Note  03/16/2020     CHIEF COMPLAINT Patient presents for No chief complaint on file.   HISTORY OF PRESENT ILLNESS: Jeffrey Good is a 68 y.o. male who presents to the clinic today for:   pt states he has gotten used to the floaters in his vision and they are not as bothersome  Referring physician: Deatra James, MD [phone] [address] [place],  [place] [address]  HISTORICAL INFORMATION:   Selected notes from the MEDICAL RECORD NUMBER Referred by Dr. Conley Rolls for eval of floaters/ischemia/heme OU.   CURRENT MEDICATIONS: No current outpatient medications on file. (Ophthalmic Drugs)   No current facility-administered medications for this visit. (Ophthalmic Drugs)   Current Outpatient Medications (Other)  Medication Sig  . acetaminophen (TYLENOL) 325 MG tablet Take 2 tablets (650 mg total) by mouth every 6 (six) hours as needed for mild pain (or temp > 100).  . carvedilol (COREG) 25 MG tablet Take 1 tablet (25 mg total) by mouth 2 (two) times daily.  . Cetirizine HCl (ZYRTEC ALLERGY) 10 MG CAPS Take 10 mg by mouth daily.   Marland Kitchen doxazosin (CARDURA XL) 4 MG 24 hr tablet Take 1 tablet (4 mg total) by mouth daily with breakfast.  . gabapentin (NEURONTIN) 300 MG capsule TAKE 1 CAPSULE BY MOUTH THREE TIMES A DAY  . HYDROcodone-acetaminophen (NORCO/VICODIN) 5-325 MG tablet Take 1-2 tablets by mouth as needed for moderate pain.  Marland Kitchen ibuprofen (ADVIL) 800 MG tablet Take 1 tablet (800 mg total) by mouth every 8 (eight) hours as needed.  . lamoTRIgine (LAMICTAL) 150 MG tablet TAKE 1 TABLET BY MOUTH TWICE A DAY  . lisinopril (ZESTRIL) 20 MG tablet Take 1 tablet (20 mg total) by mouth 2 (two) times daily.  . meloxicam (MOBIC) 7.5 MG tablet Take 1 tablet (7.5 mg total) by mouth daily.  . sildenafil (VIAGRA) 100 MG tablet One half to one po prn before intercourse  . spironolactone (ALDACTONE) 25 MG tablet TAKE 1 TABLET BY MOUTH ONCE DAILY  .  sulfamethoxazole-trimethoprim (BACTRIM DS) 800-160 MG tablet Take 1 tablet by mouth 2 (two) times daily.   No current facility-administered medications for this visit. (Other)      REVIEW OF SYSTEMS:    ALLERGIES Allergies  Allergen Reactions  . Pregabalin Nausea Only    PAST MEDICAL HISTORY Past Medical History:  Diagnosis Date  . Cataract    Mixed form OU  . Erectile dysfunction   . Hyperlipidemia   . Hypertension   . Hypertensive retinopathy    OU  . Seasonal allergies    Past Surgical History:  Procedure Laterality Date  . IR RADIOLOGIST EVAL & MGMT  07/16/2019  . IR RADIOLOGIST EVAL & MGMT  07/29/2019  . KNEE ARTHROSCOPY     1989 left  . LAPAROSCOPIC APPENDECTOMY N/A 10/15/2019   Procedure: LAPAROSCOPIC APPENDECTOMY;  Surgeon: Harriette Bouillon, MD;  Location: [facility] OR;  Service: General;  Laterality: N/A;    FAMILY HISTORY Family History  Problem Relation Age of Onset  . Hypertension Mother   . Diabetes Mother     SOCIAL HISTORY Social History   Tobacco Use  . Smoking status: Never Smoker  . Smokeless tobacco: Never Used  Vaping Use  . Vaping Use: Never used  Substance Use Topics  . Alcohol use: Yes    Alcohol/week: 1.0 standard drink    Types: 1 Cans of beer per week    Comment: very rare  .  Drug use: No         OPHTHALMIC EXAM:  Not recorded     IMAGING AND PROCEDURES  Imaging and Procedures for 03/16/2020           ASSESSMENT/PLAN:    ICD-10-CM   1. Posterior vitreous detachment of right eye  H43.811   2. Retinal ischemia  H35.82   3. 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 Retinal edema  H35.81 OCT, Retina - OU - Both Eyes  5. Essential hypertension  I10   6. Hypertensive retinopathy of both eyes  H35.033   7. Combined forms of age-related cataract of both eyes  H25.813     1. PVD / vitreous syneresis OD  - 3 wk history of floaters OD, onset ~1st of Oct 2021  - symptoms improving  - Discussed  findings and prognosis  - No RT or RD on 360 peripheral exam  - Reviewed s/s of RT/RD  - Strict return precautions for any such RT/RD signs/symptoms  - f/u week of December 6 -- DFE/OCT  2,3. Retinal Ischemia, remote BRVO OD  - exam shows sclerotic vessels in SN peripheral quad  - FA 10.19.21 shows telangectasias w/ late leakge, vascular perfusion defects in superonasal peripheral quad -- suggestive of remote BRVO   - optic disc with nasal corkscrew vessel  - discussed findings, prognosis  - no retinal intervention indicated at this time, but pt may benefit from segmental PRP to areas of nonperfusion OD  - recommend PRP OD today, 12.08.21  - pt wishes to proceed  - RBA of procedure discussed, questions answered  - informed consent obtained and signed  - see procedure note  - f/u , DFE, OCT  4. No retinal edema on exam or OCT  5,6. Hypertensive retinopathy OU - discussed importance of tight BP control - monitor  7. Mixed Cataract OU - The symptoms of cataract, surgical options, and treatments and risks were discussed with patient. - discussed diagnosis and progression - monitor  Ophthalmic Meds Ordered this visit:  No orders of the defined types were placed in this encounter.      No follow-ups on file.  There are no Patient Instructions on file for this visit.  This document serves as a record of services personally performed by Karie Chimera, MD, PhD. It was created on their behalf by Herby Abraham, COA, an ophthalmic technician. The creation of this record is the provider's dictation and/or activities during the visit.    This document serves as a record of services personally performed by Karie Chimera, MD, PhD. It was created on their behalf by Glee Arvin. Manson Passey, OA an ophthalmic technician. The creation of this record is the provider's dictation and/or activities during the visit.    Electronically signed by: Glee Arvin. Manson Passey, [place] 12.06.2021 1:04 PM  Karie Chimera, M.D., Ph.D. Diseases & Surgery of the Retina and Vitreous [facility]   Abbreviations: M myopia (nearsighted); A astigmatism; H hyperopia (farsighted); P presbyopia; Mrx spectacle prescription;  CTL contact lenses; OD right eye; OS left eye; OU both eyes  XT exotropia; ET esotropia; PEK punctate epithelial keratitis; PEE punctate epithelial erosions; DES dry eye syndrome; MGD meibomian gland dysfunction; ATs artificial tears; PFAT's preservative free artificial tears; NSC nuclear sclerotic cataract; PSC posterior subcapsular cataract; ERM epi-retinal membrane; PVD posterior vitreous detachment; RD retinal detachment; DM diabetes mellitus; DR diabetic retinopathy; NPDR non-proliferative diabetic retinopathy; PDR proliferative  diabetic retinopathy; CSME clinically significant macular edema; DME diabetic macular edema; dbh dot blot hemorrhages; CWS cotton wool spot; POAG primary open angle glaucoma; C/D cup-to-disc ratio; HVF humphrey visual field; GVF goldmann visual field; OCT optical coherence tomography; IOP intraocular pressure; BRVO Branch retinal vein occlusion; CRVO central retinal vein occlusion; CRAO central retinal artery occlusion; BRAO branch retinal artery occlusion; RT retinal tear; SB scleral buckle; PPV pars plana vitrectomy; VH Vitreous hemorrhage; PRP panretinal laser photocoagulation; IVK intravitreal kenalog; VMT vitreomacular traction; MH Macular hole;  NVD neovascularization of the disc; NVE neovascularization elsewhere; AREDS age related eye disease study; ARMD age related macular degeneration; POAG primary open angle glaucoma; EBMD epithelial/anterior basement membrane dystrophy; ACIOL anterior chamber intraocular lens; IOL intraocular lens; PCIOL posterior chamber intraocular lens; Phaco/IOL phacoemulsification with intraocular lens placement; PRK photorefractive keratectomy; LASIK laser assisted in situ keratomileusis; HTN hypertension; DM diabetes  mellitus; COPD chronic obstructive pulmonary disease

## 2020-03-15 NOTE — Progress Notes (Signed)
[facility] - Clinic Note  03/16/2020     CHIEF COMPLAINT Patient presents for Retina Follow Up   HISTORY OF PRESENT ILLNESS: Jeffrey Good is a 69 y.o. male who presents to the clinic today for:   HPI    Retina Follow Up    Patient presents with  CRVO/BRVO.  In right eye.  I, the attending physician,  performed the HPI with the patient and updated documentation appropriately.          Comments    3 week follow up Retinal Ischemia, BRVO OD- PRP OD ordered for today.  Vision stable OU.        Last edited by Rennis Chris, MD on 03/16/2020  5:01 PM. (History)    pt states he has gotten used to the floaters in his vision and they are not as bothersome  Referring physician: Deatra James, MD [phone] [address] [place],  [place] [address]  HISTORICAL INFORMATION:   Selected notes from the MEDICAL RECORD NUMBER Referred by Dr. Conley Rolls for eval of floaters/ischemia/heme OU.   CURRENT MEDICATIONS: Current Outpatient Medications (Ophthalmic Drugs)  Medication Sig  . prednisoLONE acetate (PRED FORTE) 1 % ophthalmic suspension Place 1 drop into the right eye 4 (four) times daily for 7 days.   No current facility-administered medications for this visit. (Ophthalmic Drugs)   Current Outpatient Medications (Other)  Medication Sig  . acetaminophen (TYLENOL) 325 MG tablet Take 2 tablets (650 mg total) by mouth every 6 (six) hours as needed for mild pain (or temp > 100).  . carvedilol (COREG) 25 MG tablet Take 1 tablet (25 mg total) by mouth 2 (two) times daily.  . Cetirizine HCl (ZYRTEC ALLERGY) 10 MG CAPS Take 10 mg by mouth daily.   Marland Kitchen doxazosin (CARDURA XL) 4 MG 24 hr tablet Take 1 tablet (4 mg total) by mouth daily with breakfast.  . gabapentin (NEURONTIN) 300 MG capsule TAKE 1 CAPSULE BY MOUTH THREE TIMES A DAY  . HYDROcodone-acetaminophen (NORCO/VICODIN) 5-325 MG tablet Take 1-2 tablets by mouth as needed for moderate pain.  Marland Kitchen ibuprofen (ADVIL) 800 MG  tablet Take 1 tablet (800 mg total) by mouth every 8 (eight) hours as needed.  . lamoTRIgine (LAMICTAL) 150 MG tablet TAKE 1 TABLET BY MOUTH TWICE A DAY  . lisinopril (ZESTRIL) 20 MG tablet Take 1 tablet (20 mg total) by mouth 2 (two) times daily.  . meloxicam (MOBIC) 7.5 MG tablet Take 1 tablet (7.5 mg total) by mouth daily.  . sildenafil (VIAGRA) 100 MG tablet One half to one po prn before intercourse  . spironolactone (ALDACTONE) 25 MG tablet TAKE 1 TABLET BY MOUTH ONCE DAILY  . sulfamethoxazole-trimethoprim (BACTRIM DS) 800-160 MG tablet Take 1 tablet by mouth 2 (two) times daily. (Patient not taking: Reported on 03/16/2020)   No current facility-administered medications for this visit. (Other)      REVIEW OF SYSTEMS: ROS    Positive for: Eyes   Negative for: Constitutional, Gastrointestinal, Neurological, Skin, Genitourinary, Musculoskeletal, HENT, Endocrine, Cardiovascular, Respiratory, Psychiatric, Allergic/Imm, Heme/Lymph   Last edited by Joni Reining, COA on 03/16/2020  2:36 PM. (History)       ALLERGIES Allergies  Allergen Reactions  . Pregabalin Nausea Only    PAST MEDICAL HISTORY Past Medical History:  Diagnosis Date  . Cataract    Mixed form OU  . Erectile dysfunction   . Hyperlipidemia   . Hypertension   . Hypertensive retinopathy    OU  .  Seasonal allergies    Past Surgical History:  Procedure Laterality Date  . IR RADIOLOGIST EVAL & MGMT  07/16/2019  . IR RADIOLOGIST EVAL & MGMT  07/29/2019  . KNEE ARTHROSCOPY     1989 left  . LAPAROSCOPIC APPENDECTOMY N/A 10/15/2019   Procedure: LAPAROSCOPIC APPENDECTOMY;  Surgeon: Harriette Bouillon, MD;  Location: [facility] OR;  Service: General;  Laterality: N/A;    FAMILY HISTORY Family History  Problem Relation Age of Onset  . Hypertension Mother   . Diabetes Mother     SOCIAL HISTORY Social History   Tobacco Use  . Smoking status: Never Smoker  . Smokeless tobacco: Never Used  Vaping Use  . Vaping Use: Never used   Substance Use Topics  . Alcohol use: Yes    Alcohol/week: 1.0 standard drink    Types: 1 Cans of beer per week    Comment: very rare  . Drug use: No         OPHTHALMIC EXAM:  Base Eye Exam    Visual Acuity (Snellen - Linear)      Right Left   Dist [place] 20/25- 20/20   Dist ph  20/20        Tonometry (Tonopen, 2:42 PM)      Right Left   Pressure 11 13       Pupils      Dark Light Shape React APD   Right 3 2 Round Brisk None   Left 3 2 Round Brisk None       Visual Fields (Counting fingers)      Left Right    Full Full       Extraocular Movement      Right Left    Full Full       Neuro/Psych    Oriented x3: Yes   Mood/Affect: Normal       Dilation    Both eyes: 1.0% Mydriacyl, 2.5% Phenylephrine @ 2:42 PM        Slit Lamp and Fundus Exam    Slit Lamp Exam      Right Left   Lids/Lashes Dermatochalasis - upper lid Dermatochalasis - upper lid   Conjunctiva/Sclera mild Melanosis nasal and temporal pinguecula, mild melanosis   Cornea arcus, mild Debris in tear film arcus, mild Debris in tear film, 1+ Punctate epithelial erosions   Anterior Chamber Deep and quiet Deep and quiet   Iris Round and moderately dilated Round and moderately dilated   Lens 2+ Nuclear sclerosis, 2+ Cortical cataract 2+ Nuclear sclerosis, 2+ Cortical cataract   Vitreous Vitreous syneresis, Posterior vitreous detachment, mild improvement in vitreous condensations Vitreous syneresis       Fundus Exam      Right Left   Disc Pink and Sharp, Compact, corkscrew vessel nasally Pink and Sharp, Compact, focal temporal PPP   C/D Ratio 0.2 0.2   Macula Flat, Good foveal reflex, mild RPE mottling, No heme or edema Flat, Good foveal reflex, mild RPE mottling, No heme or edema   Vessels attenuated, mild tortuousity, sclerosis SN peripheray attenuated, Tortuous, mild Copper wiring   Periphery Attached, peripheral drusen, sclerotic vessels superonasal periphery, focal blot hemes and punctate  exudates SN quad, No RT/RD Attached, focal punctate CR atrophy superior to disc             IMAGING AND PROCEDURES  Imaging and Procedures for 03/16/2020  OCT, Retina - OU - Both Eyes       Right Eye Quality was good. Central Foveal Thickness:  276. Progression has improved. Findings include normal foveal contour, no IRF, no SRF (Interval improvement in vitreous opacities).   Left Eye Quality was good. Central Foveal Thickness: 274. Progression has been stable. Findings include normal foveal contour, no IRF, no SRF, vitreomacular adhesion .   Notes *Images captured and stored on drive  Diagnosis / Impression:  NFP, no IRF/SRF OU OD: interval improvement in vitreous opacities  Clinical management:  See below  Abbreviations: NFP - Normal foveal profile. CME - cystoid macular edema. PED - pigment epithelial detachment. IRF - intraretinal fluid. SRF - subretinal fluid. EZ - ellipsoid zone. ERM - epiretinal membrane. ORA - outer retinal atrophy. ORT - outer retinal tubulation. SRHM - subretinal hyper-reflective material. 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   Lumenis [id number] slit lamp laser Pattern: 3x3 square Power: 240 mW Duration: 30 msec  Spot size: 200 microns  # spots: 1000 spots superior and nasal periphery in areas of vascular nonperfusion w/ sclerotic vessels  Complications: None.  Notes: focal cortical cataract obscuring view and preventing laser up take in focal peripheral areas  RTC: 6 wks for DFE/OCT  Patient tolerated the procedure well and received  written and verbal post-procedure care information/education.                  ASSESSMENT/PLAN:    ICD-10-CM   1. Posterior vitreous detachment of right eye  H43.811   2. Retinal ischemia  H35.82 Panretinal Photocoagulation - OD - Right Eye  3. 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 Proliferative retinopathy of right eye  H35.21 Panretinal Photocoagulation - OD - Right Eye  5. Retinal edema  H35.81 OCT, Retina - OU - Both Eyes  6. Essential hypertension  I10   7. Hypertensive retinopathy of both eyes  H35.033   8. Combined forms of age-related cataract of both eyes  H25.813     1. PVD / vitreous syneresis OD  - history of floaters OD, onset ~1st of Oct 2021 -- improving  - symptoms improving  - Discussed findings and prognosis  - No RT or RD on 360 peripheral exam  - Reviewed s/s of RT/RD  - Strict return precautions for any such RT/RD signs/symptoms  2-4. Retinal Ischemia, remote BRVO w/ proliferative retinpathy OD  - exam shows sclerotic vessels in SN peripheral quad  - FA 10.19.21 shows telangectasias w/ late leakage, vascular perfusion defects in superonasal peripheral quad w/ early NV-- suggestive of remote BRVO   - optic disc with nasal corkscrew vessel  - discussed findings, prognosis  - no retinal intervention indicated at this time, but pt may benefit from segmental PRP to areas of nonperfusion OD  - recommend PRP OD in Dec  - f/u week of December 6, DFE, OCT, PRP OD  5. No retinal edema on exam or OCT  6,7. Hypertensive retinopathy OU - discussed importance of tight BP control - monitor  8. Mixed Cataract OU - The symptoms of cataract, surgical options, and treatments and risks were discussed with  patient. - discussed diagnosis and progression - monitor  Ophthalmic Meds Ordered this visit:  Meds ordered this encounter  Medications  . prednisoLONE acetate (PRED FORTE) 1 % ophthalmic  suspension    Sig: Place 1 drop into the right eye 4 (four) times daily for 7 days.    Dispense:  10 mL    Refill:  0       Return in about 6 weeks (around 04/27/2020) for BRVO OD, Dilated Exam, OCT.  There are no Patient Instructions on file for this visit.  This document serves as a record of services personally performed by Karie ChimeraBrian G. Oliana Gowens, MD, PhD. It was created on their behalf by Joni Reiningobin Hodges, an ophthalmic technician. The creation of this record is the provider's dictation and/or activities during the visit.    Electronically signed by: Joni Reiningobin Hodges COA, 03/16/20  5:10 PM   Karie ChimeraBrian G. Kerrigan Gombos, M.D., Ph.D. Diseases & Surgery of the Retina and Vitreous [facility]  I have reviewed the above documentation for accuracy and completeness, and I agree with the above. Karie ChimeraBrian G. Cornellius Kropp, M.D., Ph.D. 03/16/20 5:10 PM      Abbreviations: M myopia (nearsighted); A astigmatism; H hyperopia (farsighted); P presbyopia; Mrx spectacle prescription;  CTL contact lenses; OD right eye; OS left eye; OU both eyes  XT exotropia; ET esotropia; PEK punctate epithelial keratitis; PEE punctate epithelial erosions; DES dry eye syndrome; MGD meibomian gland dysfunction; ATs artificial tears; PFAT's preservative free artificial tears; NSC nuclear sclerotic cataract; PSC posterior subcapsular cataract; ERM epi-retinal membrane; PVD posterior vitreous detachment; RD retinal detachment; DM diabetes mellitus; DR diabetic retinopathy; NPDR non-proliferative diabetic retinopathy; PDR proliferative diabetic retinopathy; CSME clinically significant macular edema; DME diabetic macular edema; dbh dot blot hemorrhages; CWS cotton wool spot; POAG primary open angle glaucoma; C/D cup-to-disc ratio; HVF humphrey visual field; GVF goldmann visual field; OCT optical coherence tomography; IOP intraocular pressure; BRVO Branch retinal vein occlusion; CRVO central retinal vein occlusion; CRAO central retinal  artery occlusion; BRAO branch retinal artery occlusion; RT retinal tear; SB scleral buckle; PPV pars plana vitrectomy; VH Vitreous hemorrhage; PRP panretinal laser photocoagulation; IVK intravitreal kenalog; VMT vitreomacular traction; MH Macular hole;  NVD neovascularization of the disc; NVE neovascularization elsewhere; AREDS age related eye disease study; ARMD age related macular degeneration; POAG primary open angle glaucoma; EBMD epithelial/anterior basement membrane dystrophy; ACIOL anterior chamber intraocular lens; IOL intraocular lens; PCIOL posterior chamber intraocular lens; Phaco/IOL phacoemulsification with intraocular lens placement; PRK photorefractive keratectomy; LASIK laser assisted in situ keratomileusis; HTN hypertension; DM diabetes mellitus; COPD chronic obstructive pulmonary disease

## 2020-03-16 ENCOUNTER — Other Ambulatory Visit: Payer: Self-pay

## 2020-03-16 ENCOUNTER — Ambulatory Visit (INDEPENDENT_AMBULATORY_CARE_PROVIDER_SITE_OTHER): Payer: PRIVATE HEALTH INSURANCE | Admitting: Ophthalmology

## 2020-03-16 ENCOUNTER — Encounter (INDEPENDENT_AMBULATORY_CARE_PROVIDER_SITE_OTHER): Payer: Self-pay | Admitting: Ophthalmology

## 2020-03-16 DIAGNOSIS — H25813 Combined forms of age-related cataract, bilateral: Secondary | ICD-10-CM

## 2020-03-16 DIAGNOSIS — H348312 Tributary (branch) retinal vein occlusion, right eye, stable: Secondary | ICD-10-CM | POA: Diagnosis not present

## 2020-03-16 DIAGNOSIS — H43811 Vitreous degeneration, right eye: Secondary | ICD-10-CM

## 2020-03-16 DIAGNOSIS — H3581 Retinal edema: Secondary | ICD-10-CM

## 2020-03-16 DIAGNOSIS — H35033 Hypertensive retinopathy, bilateral: Secondary | ICD-10-CM

## 2020-03-16 DIAGNOSIS — H3582 Retinal ischemia: Secondary | ICD-10-CM

## 2020-03-16 DIAGNOSIS — I10 Essential (primary) hypertension: Secondary | ICD-10-CM

## 2020-03-16 DIAGNOSIS — H3521 Other non-diabetic proliferative retinopathy, right eye: Secondary | ICD-10-CM | POA: Diagnosis not present

## 2020-03-16 MED ORDER — PREDNISOLONE ACETATE 1 % OP SUSP
1.0000 [drp] | Freq: Four times a day (QID) | OPHTHALMIC | 0 refills | Status: AC
Start: 2020-03-16 — End: 2020-03-23

## 2020-03-22 ENCOUNTER — Other Ambulatory Visit: Payer: Self-pay | Admitting: Neurology

## 2020-03-22 MED ORDER — HYDROCODONE-ACETAMINOPHEN 5-325 MG PO TABS: 1.0000 | ORAL_TABLET | ORAL | 0 refills | Status: DC | PRN

## 2020-03-22 NOTE — Addendum Note (Signed)
Addended by: Arther Abbott on: 03/22/2020 12:57 PM   Modules accepted: Orders

## 2020-03-22 NOTE — Telephone Encounter (Signed)
Pt. requests refill for HYDROcodone-acetaminophen (NORCO/VICODIN) 5-325 MG tablet.  Pharmacy: [facility] [phone]

## 2020-04-27 ENCOUNTER — Other Ambulatory Visit: Payer: Self-pay | Admitting: Neurology

## 2020-04-27 MED ORDER — HYDROCODONE-ACETAMINOPHEN 5-325 MG PO TABS: 1.0000 | ORAL_TABLET | ORAL | 0 refills | Status: DC | PRN

## 2020-04-27 NOTE — Telephone Encounter (Signed)
Pt called needing a refill on his HYDROcodone-acetaminophen (NORCO/VICODIN) 5-325 MG tablet sent in to the [facility] on [address].

## 2020-04-29 ENCOUNTER — Encounter (INDEPENDENT_AMBULATORY_CARE_PROVIDER_SITE_OTHER): Payer: PRIVATE HEALTH INSURANCE | Admitting: Ophthalmology

## 2020-05-02 ENCOUNTER — Telehealth (INDEPENDENT_AMBULATORY_CARE_PROVIDER_SITE_OTHER): Payer: Self-pay

## 2020-05-04 ENCOUNTER — Telehealth: Payer: Self-pay | Admitting: Neurology

## 2020-05-04 NOTE — Telephone Encounter (Signed)
Reviewed pt chart. Dr. Epimenio Foot last refilled rx to [facility] on 01/21/20 #10 with 11 refills. There should be refills on file at the pharmacy.

## 2020-05-04 NOTE — Telephone Encounter (Signed)
Pt. is requesting a refill for sildenafil (VIAGRA) 100 MG tablet.  Pharmacy: [facility] [phone]

## 2020-05-11 NOTE — Progress Notes (Shared)
[facility] - Clinic Note  05/13/2020     CHIEF COMPLAINT Patient presents for No chief complaint on file.   HISTORY OF PRESENT ILLNESS: Jeffrey Good is a 70 y.o. male who presents to the clinic today for:   pt states he has gotten used to the floaters in his vision and they are not as bothersome  Referring physician: Deatra James, MD [phone] [address] [place],  [place] [address]  HISTORICAL INFORMATION:   Selected notes from the MEDICAL RECORD NUMBER Referred by Dr. Conley Rolls for eval of floaters/ischemia/heme OU.   CURRENT MEDICATIONS: No current outpatient medications on file. (Ophthalmic Drugs)   No current facility-administered medications for this visit. (Ophthalmic Drugs)   Current Outpatient Medications (Other)  Medication Sig  . acetaminophen (TYLENOL) 325 MG tablet Take 2 tablets (650 mg total) by mouth every 6 (six) hours as needed for mild pain (or temp > 100).  . carvedilol (COREG) 25 MG tablet Take 1 tablet (25 mg total) by mouth 2 (two) times daily.  . Cetirizine HCl (ZYRTEC ALLERGY) 10 MG CAPS Take 10 mg by mouth daily.   Marland Kitchen doxazosin (CARDURA XL) 4 MG 24 hr tablet Take 1 tablet (4 mg total) by mouth daily with breakfast.  . gabapentin (NEURONTIN) 300 MG capsule TAKE 1 CAPSULE BY MOUTH THREE TIMES A DAY  . HYDROcodone-acetaminophen (NORCO/VICODIN) 5-325 MG tablet Take 1-2 tablets by mouth as needed for moderate pain.  Marland Kitchen ibuprofen (ADVIL) 800 MG tablet Take 1 tablet (800 mg total) by mouth every 8 (eight) hours as needed.  . lamoTRIgine (LAMICTAL) 150 MG tablet TAKE 1 TABLET BY MOUTH TWICE A DAY  . lisinopril (ZESTRIL) 20 MG tablet Take 1 tablet (20 mg total) by mouth 2 (two) times daily.  . meloxicam (MOBIC) 7.5 MG tablet Take 1 tablet (7.5 mg total) by mouth daily.  . sildenafil (VIAGRA) 100 MG tablet One half to one po prn before intercourse  . spironolactone (ALDACTONE) 25 MG tablet TAKE 1 TABLET BY MOUTH ONCE DAILY  .  sulfamethoxazole-trimethoprim (BACTRIM DS) 800-160 MG tablet Take 1 tablet by mouth 2 (two) times daily. (Patient not taking: Reported on 03/16/2020)   No current facility-administered medications for this visit. (Other)      REVIEW OF SYSTEMS:    ALLERGIES Allergies  Allergen Reactions  . Pregabalin Nausea Only    PAST MEDICAL HISTORY Past Medical History:  Diagnosis Date  . Cataract    Mixed form OU  . Erectile dysfunction   . Hyperlipidemia   . Hypertension   . Hypertensive retinopathy    OU  . Seasonal allergies    Past Surgical History:  Procedure Laterality Date  . IR RADIOLOGIST EVAL & MGMT  07/16/2019  . IR RADIOLOGIST EVAL & MGMT  07/29/2019  . KNEE ARTHROSCOPY     1989 left  . LAPAROSCOPIC APPENDECTOMY N/A 10/15/2019   Procedure: LAPAROSCOPIC APPENDECTOMY;  Surgeon: Harriette Bouillon, MD;  Location: [facility] OR;  Service: General;  Laterality: N/A;    FAMILY HISTORY Family History  Problem Relation Age of Onset  . Hypertension Mother   . Diabetes Mother     SOCIAL HISTORY Social History   Tobacco Use  . Smoking status: Never Smoker  . Smokeless tobacco: Never Used  Vaping Use  . Vaping Use: Never used  Substance Use Topics  . Alcohol use: Yes    Alcohol/week: 1.0 standard drink    Types: 1 Cans of beer per week  Comment: very rare  . Drug use: No         OPHTHALMIC EXAM:  Not recorded     IMAGING AND PROCEDURES  Imaging and Procedures for 05/13/2020           ASSESSMENT/PLAN:    ICD-10-CM   1. Posterior vitreous detachment of right eye  H43.811   2. Retinal ischemia  H35.82   3. 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 Proliferative retinopathy of right eye  H35.21   5. Retinal edema  H35.81   6. Essential hypertension  I10   7. Hypertensive retinopathy of both eyes  H35.033   8. Combined forms of age-related cataract of both eyes  H25.813     1. PVD / vitreous syneresis OD  - history of floaters OD,  onset ~1st of Oct 2021 -- improving  - symptoms improving  - Discussed findings and prognosis  - No RT or RD on 360 peripheral exam  - Reviewed s/s of RT/RD  - Strict return precautions for any such RT/RD signs/symptoms  2-4. Retinal Ischemia, remote BRVO w/ proliferative retinpathy OD  - exam shows sclerotic vessels in SN peripheral quad  - FA 10.19.21 shows telangectasias w/ late leakage, vascular perfusion defects in superonasal peripheral quad w/ early NV-- suggestive of remote BRVO   - optic disc with nasal corkscrew vessel  - discussed findings, prognosis  - s/p PRP OD (12.08.21)  - f/u   5. No retinal edema on exam or OCT  6,7. Hypertensive retinopathy OU - discussed importance of tight BP control - monitor  8. Mixed Cataract OU - The symptoms of cataract, surgical options, and treatments and risks were discussed with patient. - discussed diagnosis and progression - monitor  Ophthalmic Meds Ordered this visit:  No orders of the defined types were placed in this encounter.      No follow-ups on file.  There are no Patient Instructions on file for this visit.  This document serves as a record of services personally performed by Karie Chimera, MD, PhD. It was created on their behalf by Glee Arvin. Manson Passey, OA an ophthalmic technician. The creation of this record is the provider's dictation and/or activities during the visit.    Electronically signed by: Glee Arvin. Manson Passey, [place] 02.02.2022 1:09 PM   Karie Chimera, M.D., Ph.D. Diseases & Surgery of the Retina and Vitreous [facility]       Abbreviations: M myopia (nearsighted); A astigmatism; H hyperopia (farsighted); P presbyopia; Mrx spectacle prescription;  CTL contact lenses; OD right eye; OS left eye; OU both eyes  XT exotropia; ET esotropia; PEK punctate epithelial keratitis; PEE punctate epithelial erosions; DES dry eye syndrome; MGD meibomian gland dysfunction; ATs artificial tears;  PFAT's preservative free artificial tears; NSC nuclear sclerotic cataract; PSC posterior subcapsular cataract; ERM epi-retinal membrane; PVD posterior vitreous detachment; RD retinal detachment; DM diabetes mellitus; DR diabetic retinopathy; NPDR non-proliferative diabetic retinopathy; PDR proliferative diabetic retinopathy; CSME clinically significant macular edema; DME diabetic macular edema; dbh dot blot hemorrhages; CWS cotton wool spot; POAG primary open angle glaucoma; C/D cup-to-disc ratio; HVF humphrey visual field; GVF goldmann visual field; OCT optical coherence tomography; IOP intraocular pressure; BRVO Branch retinal vein occlusion; CRVO central retinal vein occlusion; CRAO central retinal artery occlusion; BRAO branch retinal artery occlusion; RT retinal tear; SB scleral buckle; PPV pars plana vitrectomy; VH Vitreous hemorrhage; PRP panretinal laser photocoagulation; IVK intravitreal kenalog; VMT vitreomacular traction; MH  Macular hole;  NVD neovascularization of the disc; NVE neovascularization elsewhere; AREDS age related eye disease study; ARMD age related macular degeneration; POAG primary open angle glaucoma; EBMD epithelial/anterior basement membrane dystrophy; ACIOL anterior chamber intraocular lens; IOL intraocular lens; PCIOL posterior chamber intraocular lens; Phaco/IOL phacoemulsification with intraocular lens placement; [place] photorefractive keratectomy; LASIK laser assisted in situ keratomileusis; HTN hypertension; DM diabetes mellitus; COPD chronic obstructive pulmonary disease

## 2020-05-13 ENCOUNTER — Encounter (INDEPENDENT_AMBULATORY_CARE_PROVIDER_SITE_OTHER): Payer: PRIVATE HEALTH INSURANCE | Admitting: Ophthalmology

## 2020-05-13 DIAGNOSIS — H3582 Retinal ischemia: Secondary | ICD-10-CM

## 2020-05-13 DIAGNOSIS — H348312 Tributary (branch) retinal vein occlusion, right eye, stable: Secondary | ICD-10-CM

## 2020-05-13 DIAGNOSIS — H43811 Vitreous degeneration, right eye: Secondary | ICD-10-CM

## 2020-05-13 DIAGNOSIS — I10 Essential (primary) hypertension: Secondary | ICD-10-CM

## 2020-05-13 DIAGNOSIS — H35033 Hypertensive retinopathy, bilateral: Secondary | ICD-10-CM

## 2020-05-13 DIAGNOSIS — H3521 Other non-diabetic proliferative retinopathy, right eye: Secondary | ICD-10-CM

## 2020-05-13 DIAGNOSIS — H3581 Retinal edema: Secondary | ICD-10-CM

## 2020-05-13 DIAGNOSIS — H25813 Combined forms of age-related cataract, bilateral: Secondary | ICD-10-CM

## 2020-05-17 NOTE — Progress Notes (Signed)
[facility] - Clinic Note  05/20/2020     CHIEF COMPLAINT Patient presents for Retina Follow Up   HISTORY OF PRESENT ILLNESS: Jeffrey Good is a 70 y.o. male who presents to the clinic today for:   HPI    Retina Follow Up    Patient presents with  CRVO/BRVO.  In right eye.  This started 6 weeks ago.  I, the attending physician,  performed the HPI with the patient and updated documentation appropriately.          Comments    Patient here for 6 weeks retina follow PVD OD. Patient states vision still has that little shadow OD. After laser sx had little spots that went away. No eye pain.        Last edited by Rennis Chris, MD on 05/20/2020  4:01 PM. (History)    pt states he had no problems after laser procedure at last visit, he states he has a shadow in his right eye vision (temporally), he states it moves when he moves his eye  Referring physician: Deatra James, MD [phone] [address] [place],  [place] [address]  HISTORICAL INFORMATION:   Selected notes from the MEDICAL RECORD NUMBER Referred by Dr. Conley Rolls for eval of floaters/ischemia/heme OU.   CURRENT MEDICATIONS: No current outpatient medications on file. (Ophthalmic Drugs)   No current facility-administered medications for this visit. (Ophthalmic Drugs)   Current Outpatient Medications (Other)  Medication Sig  . acetaminophen (TYLENOL) 325 MG tablet Take 2 tablets (650 mg total) by mouth every 6 (six) hours as needed for mild pain (or temp > 100).  . carvedilol (COREG) 25 MG tablet Take 1 tablet (25 mg total) by mouth 2 (two) times daily.  . Cetirizine HCl (ZYRTEC ALLERGY) 10 MG CAPS Take 10 mg by mouth daily.   Marland Kitchen doxazosin (CARDURA XL) 4 MG 24 hr tablet Take 1 tablet (4 mg total) by mouth daily with breakfast.  . gabapentin (NEURONTIN) 300 MG capsule TAKE 1 CAPSULE BY MOUTH THREE TIMES A DAY  . HYDROcodone-acetaminophen (NORCO/VICODIN) 5-325 MG tablet Take 1-2 tablets by mouth as needed for  moderate pain.  Marland Kitchen ibuprofen (ADVIL) 800 MG tablet Take 1 tablet (800 mg total) by mouth every 8 (eight) hours as needed.  . lamoTRIgine (LAMICTAL) 150 MG tablet TAKE 1 TABLET BY MOUTH TWICE A DAY  . lisinopril (ZESTRIL) 20 MG tablet Take 1 tablet (20 mg total) by mouth 2 (two) times daily.  . meloxicam (MOBIC) 7.5 MG tablet Take 1 tablet (7.5 mg total) by mouth daily.  . sildenafil (VIAGRA) 100 MG tablet One half to one po prn before intercourse  . spironolactone (ALDACTONE) 25 MG tablet TAKE 1 TABLET BY MOUTH ONCE DAILY  . sulfamethoxazole-trimethoprim (BACTRIM DS) 800-160 MG tablet Take 1 tablet by mouth 2 (two) times daily. (Patient not taking: Reported on 03/16/2020)   No current facility-administered medications for this visit. (Other)      REVIEW OF SYSTEMS: ROS    Positive for: Musculoskeletal, Eyes, Allergic/Imm   Negative for: Constitutional, Gastrointestinal, Neurological, Skin, Genitourinary, HENT, Endocrine, Cardiovascular, Respiratory, Psychiatric, Heme/Lymph   Last edited by Laddie Aquas, COA on 05/20/2020  3:09 PM. (History)       ALLERGIES Allergies  Allergen Reactions  . Pregabalin Nausea Only    PAST MEDICAL HISTORY Past Medical History:  Diagnosis Date  . Cataract    Mixed form OU  . Erectile dysfunction   . Hyperlipidemia   .  Hypertension   . Hypertensive retinopathy    OU  . Seasonal allergies    Past Surgical History:  Procedure Laterality Date  . IR RADIOLOGIST EVAL & MGMT  07/16/2019  . IR RADIOLOGIST EVAL & MGMT  07/29/2019  . KNEE ARTHROSCOPY     1989 left  . LAPAROSCOPIC APPENDECTOMY N/A 10/15/2019   Procedure: LAPAROSCOPIC APPENDECTOMY;  Surgeon: Harriette Bouillon, MD;  Location: [facility] OR;  Service: General;  Laterality: N/A;    FAMILY HISTORY Family History  Problem Relation Age of Onset  . Hypertension Mother   . Diabetes Mother     SOCIAL HISTORY Social History   Tobacco Use  . Smoking status: Never Smoker  . Smokeless tobacco:  Never Used  Vaping Use  . Vaping Use: Never used  Substance Use Topics  . Alcohol use: Yes    Alcohol/week: 1.0 standard drink    Types: 1 Cans of beer per week    Comment: very rare  . Drug use: No         OPHTHALMIC EXAM:  Base Eye Exam    Visual Acuity (Snellen - Linear)      Right Left   Dist [place] 20/20 -1 20/20       Tonometry (Tonopen, 3:06 PM)      Right Left   Pressure 12 12       Pupils      Dark Light Shape React APD   Right 3 2 Round Brisk None   Left 3 2 Round Brisk None       Visual Fields (Counting fingers)      Left Right    Full Full       Extraocular Movement      Right Left    Full Full       Neuro/Psych    Oriented x3: Yes   Mood/Affect: Normal       Dilation    Both eyes: 1.0% Mydriacyl, 2.5% Phenylephrine @ 3:06 PM        Slit Lamp and Fundus Exam    Slit Lamp Exam      Right Left   Lids/Lashes Dermatochalasis - upper lid Dermatochalasis - upper lid   Conjunctiva/Sclera mild Melanosis nasal and temporal pinguecula, mild melanosis   Cornea arcus, mild Debris in tear film arcus, mild Debris in tear film, 1+ Punctate epithelial erosions   Anterior Chamber Deep and quiet Deep and quiet   Iris Round and moderately dilated Round and moderately dilated   Lens 2+ Nuclear sclerosis, 2+ Cortical cataract 2+ Nuclear sclerosis, 2+ Cortical cataract   Vitreous Vitreous syneresis, Posterior vitreous detachment, mild improvement in vitreous condensations Vitreous syneresis       Fundus Exam      Right Left   Disc Pink and Sharp, Compact, corkscrew vessel nasally Pink and Sharp, Compact, focal temporal PPP   C/D Ratio 0.2 0.2   Macula Flat, Good foveal reflex, mild RPE mottling, No heme or edema Flat, Good foveal reflex, mild RPE mottling, No heme or edema   Vessels attenuated, mild tortuousity, sclerosis SN peripheray attenuated, Tortuous, mild Copper wiring   Periphery Attached, peripheral drusen, sclerotic vessels superonasal periphery --  good segmental PRP laser changes; +focal blot hemes and punctate exudates SN quad, No RT/RD Attached, focal punctate CR atrophy superior to disc           Refraction    Wearing Rx      Sphere Cylinder Axis Add   Right +0.25 +1.25 125 +  2.75   Left -0.25 +1.50 075 +2.75          IMAGING AND PROCEDURES  Imaging and Procedures for 05/20/2020  OCT, Retina - OU - Both Eyes       Right Eye Quality was good. Central Foveal Thickness: 285. Progression has improved. Findings include normal foveal contour, no IRF, no SRF (Interval improvement in vitreous opacities).   Left Eye Quality was good. Central Foveal Thickness: 270. Progression has been stable. Findings include normal foveal contour, no IRF, no SRF, vitreomacular adhesion .   Notes *Images captured and stored on drive  Diagnosis / Impression:  NFP, no IRF/SRF OU OD: interval improvement in vitreous opacities  Clinical management:  See below  Abbreviations: NFP - Normal foveal profile. CME - cystoid macular edema. PED - pigment epithelial detachment. IRF - intraretinal fluid. SRF - subretinal fluid. EZ - ellipsoid zone. ERM - epiretinal membrane. ORA - outer retinal atrophy. ORT - outer retinal tubulation. SRHM - subretinal hyper-reflective material. IRHM - intraretinal hyper-reflective material                 ASSESSMENT/PLAN:    ICD-10-CM   1. Posterior vitreous detachment of right eye  H43.811   2. Retinal ischemia  H35.82   3. 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 Proliferative retinopathy of right eye  H35.21   5. Retinal edema  H35.81 OCT, Retina - OU - Both Eyes  6. Essential hypertension  I10   7. Hypertensive retinopathy of both eyes  H35.033   8. Combined forms of age-related cataract of both eyes  H25.813     1. PVD / vitreous syneresis OD  - history of floaters OD, onset ~1st of Oct 2021 -- persistent, but improved from initial onset  - Discussed findings and  prognosis  - No RT or RD on 360 peripheral exam  - Reviewed s/s of RT/RD  - Strict return precautions for any such RT/RD signs/symptoms  2-4. Retinal Ischemia, remote BRVO w/ proliferative retinpathy OD  - exam shows sclerotic vessels in SN peripheral quad  - FA 10.19.21 shows telangectasias w/ late leakage, vascular perfusion defects in superonasal peripheral quad w/ early NV-- suggestive of remote BRVO   - optic disc with nasal corkscrew vessel  - s/p PRP OD (12.08.21) -- good laser changes in place  - f/u 3 months, DFE, OCT, FA (transit OD)  5. No retinal edema on exam or OCT  6,7. Hypertensive retinopathy OU - discussed importance of tight BP control - monitor  8. Mixed Cataract OU - The symptoms of cataract, surgical options, and treatments and risks were discussed with patient. - discussed diagnosis and progression - monitor  Ophthalmic Meds Ordered this visit:  No orders of the defined types were placed in this encounter.      Return in about 3 months (around 08/17/2020) for f/u retinal ishcemia OD, DFE, OCT, FA (transit OD).  There are no Patient Instructions on file for this visit.  This document serves as a record of services personally performed by Karie Chimera, MD, PhD. It was created on their behalf by Glee Arvin. Manson Passey, OA an ophthalmic technician. The creation of this record is the provider's dictation and/or activities during the visit.    Electronically signed by: Glee Arvin. Manson Passey, [place] 02.08.2022 4:04 PM   Karie Chimera, M.D., Ph.D. Diseases & Surgery of the Retina and Vitreous [facility]  I have reviewed  the above documentation for accuracy and completeness, and I agree with the above. Karie Chimera, M.D., Ph.D. 05/20/20 4:04 PM   Abbreviations: M myopia (nearsighted); A astigmatism; H hyperopia (farsighted); P presbyopia; Mrx spectacle prescription;  CTL contact lenses; OD right eye; OS left eye; OU both eyes  XT exotropia; ET  esotropia; PEK punctate epithelial keratitis; PEE punctate epithelial erosions; DES dry eye syndrome; MGD meibomian gland dysfunction; ATs artificial tears; PFAT's preservative free artificial tears; NSC nuclear sclerotic cataract; PSC posterior subcapsular cataract; ERM epi-retinal membrane; PVD posterior vitreous detachment; RD retinal detachment; DM diabetes mellitus; DR diabetic retinopathy; NPDR non-proliferative diabetic retinopathy; PDR proliferative diabetic retinopathy; CSME clinically significant macular edema; DME diabetic macular edema; dbh dot blot hemorrhages; CWS cotton wool spot; POAG primary open angle glaucoma; C/D cup-to-disc ratio; HVF humphrey visual field; GVF goldmann visual field; OCT optical coherence tomography; IOP intraocular pressure; BRVO Branch retinal vein occlusion; CRVO central retinal vein occlusion; CRAO central retinal artery occlusion; BRAO branch retinal artery occlusion; RT retinal tear; SB scleral buckle; PPV pars plana vitrectomy; VH Vitreous hemorrhage; PRP panretinal laser photocoagulation; IVK intravitreal kenalog; VMT vitreomacular traction; MH Macular hole;  NVD neovascularization of the disc; NVE neovascularization elsewhere; AREDS age related eye disease study; ARMD age related macular degeneration; POAG primary open angle glaucoma; EBMD epithelial/anterior basement membrane dystrophy; ACIOL anterior chamber intraocular lens; IOL intraocular lens; PCIOL posterior chamber intraocular lens; Phaco/IOL phacoemulsification with intraocular lens placement; PRK photorefractive keratectomy; LASIK laser assisted in situ keratomileusis; HTN hypertension; DM diabetes mellitus; COPD chronic obstructive pulmonary disease

## 2020-05-18 ENCOUNTER — Encounter (INDEPENDENT_AMBULATORY_CARE_PROVIDER_SITE_OTHER): Payer: PRIVATE HEALTH INSURANCE | Admitting: Ophthalmology

## 2020-05-20 ENCOUNTER — Other Ambulatory Visit: Payer: Self-pay

## 2020-05-20 ENCOUNTER — Ambulatory Visit (INDEPENDENT_AMBULATORY_CARE_PROVIDER_SITE_OTHER): Payer: PRIVATE HEALTH INSURANCE | Admitting: Ophthalmology

## 2020-05-20 ENCOUNTER — Encounter (INDEPENDENT_AMBULATORY_CARE_PROVIDER_SITE_OTHER): Payer: Self-pay | Admitting: Ophthalmology

## 2020-05-20 DIAGNOSIS — H35033 Hypertensive retinopathy, bilateral: Secondary | ICD-10-CM

## 2020-05-20 DIAGNOSIS — H3582 Retinal ischemia: Secondary | ICD-10-CM

## 2020-05-20 DIAGNOSIS — H3581 Retinal edema: Secondary | ICD-10-CM | POA: Diagnosis not present

## 2020-05-20 DIAGNOSIS — H3521 Other non-diabetic proliferative retinopathy, right eye: Secondary | ICD-10-CM | POA: Diagnosis not present

## 2020-05-20 DIAGNOSIS — H348312 Tributary (branch) retinal vein occlusion, right eye, stable: Secondary | ICD-10-CM

## 2020-05-20 DIAGNOSIS — H43811 Vitreous degeneration, right eye: Secondary | ICD-10-CM | POA: Diagnosis not present

## 2020-05-20 DIAGNOSIS — H25813 Combined forms of age-related cataract, bilateral: Secondary | ICD-10-CM

## 2020-05-20 DIAGNOSIS — I10 Essential (primary) hypertension: Secondary | ICD-10-CM

## 2020-05-30 ENCOUNTER — Other Ambulatory Visit: Payer: Self-pay | Admitting: Neurology

## 2020-05-30 MED ORDER — HYDROCODONE-ACETAMINOPHEN 5-325 MG PO TABS: 1.0000 | ORAL_TABLET | ORAL | 0 refills | Status: DC | PRN

## 2020-05-30 NOTE — Telephone Encounter (Signed)
Pt is requesting a refill for HYDROcodone-acetaminophen (NORCO/VICODIN) 5-325 MG tablet  .  Pharmacy: [id number]   

## 2020-05-30 NOTE — Addendum Note (Signed)
Addended by: Arther Abbott on: 05/30/2020 09:55 AM   Modules accepted: Orders

## 2020-06-27 ENCOUNTER — Other Ambulatory Visit: Payer: Self-pay | Admitting: Cardiovascular Disease

## 2020-06-27 ENCOUNTER — Telehealth: Payer: Self-pay | Admitting: Neurology

## 2020-06-27 ENCOUNTER — Other Ambulatory Visit: Payer: Self-pay | Admitting: Neurology

## 2020-06-27 MED ORDER — HYDROCODONE-ACETAMINOPHEN 5-325 MG PO TABS: 1.0000 | ORAL_TABLET | ORAL | 0 refills | Status: DC | PRN

## 2020-06-27 NOTE — Telephone Encounter (Signed)
Pt request refill HYDROcodone-acetaminophen (NORCO/VICODIN) 5-325 MG tablet at [phone]

## 2020-06-27 NOTE — Telephone Encounter (Signed)
Called [facility] and spoke with [place]. We last e-scribed rx 02/22/20 #270, 1 refill. Pt should have enough med until 08/21/20. She states pt last picked up refill 05/30/20 for qty 270. It is too soon to send in a refill for this. I called pt back to let him know he should request once he has about 2-3 weeks of med left. He verbalized understanding. He also asked for refill on hydrocodone. I already sent request to MD this am for this, pending approval.  Last seen 01/21/20 and next f/u 07/28/20.

## 2020-06-27 NOTE — Telephone Encounter (Signed)
Pt request refill gabapentin (NEURONTIN) 300 MG capsule at [id number]. Pt has appt scheduled 07/28/20

## 2020-07-07 ENCOUNTER — Telehealth: Payer: Self-pay | Admitting: Neurology

## 2020-07-07 ENCOUNTER — Other Ambulatory Visit: Payer: Self-pay | Admitting: Neurology

## 2020-07-07 NOTE — Telephone Encounter (Signed)
Reviewed pt chart. Dr. Epimenio Foot last sent in refill 01/21/20 #10, 11 refills to [facility]. They should have refills on file. I called pt. He had not contacted pharmacy for refills. He will do this. Nothing further needed.

## 2020-07-07 NOTE — Telephone Encounter (Signed)
Pt called stating that he is needing a refill on his sildenafil (VIAGRA) 100 MG tablet sent in to the [facility] on [address]

## 2020-07-14 ENCOUNTER — Other Ambulatory Visit: Payer: Self-pay | Admitting: Neurology

## 2020-07-14 ENCOUNTER — Other Ambulatory Visit: Payer: Self-pay | Admitting: Cardiovascular Disease

## 2020-07-18 ENCOUNTER — Telehealth: Payer: Self-pay | Admitting: Family Medicine

## 2020-07-18 MED ORDER — LAMOTRIGINE 150 MG PO TABS: 150.0000 mg | ORAL_TABLET | Freq: Two times a day (BID) | ORAL | 0 refills | Status: DC

## 2020-07-18 NOTE — Telephone Encounter (Signed)
Patient called stating that the pharmacy will not fill Lamotrigine because its been too long since its been filled. The pharmacy said it hasn't been filled since October.

## 2020-07-18 NOTE — Addendum Note (Signed)
Addended by: Guy Begin on: 07/18/2020 03:21 PM   Modules accepted: Orders

## 2020-07-18 NOTE — Telephone Encounter (Signed)
Spoke to pt and he states that he was using up what he had?  He last took 2 days ago.  I would refill for 30 days.  Has appt coming up, then will reassess and can do for year if so.  He appreciated call.  Using mg po 150mg  po bid. Using only [facility] [address]

## 2020-07-21 ENCOUNTER — Ambulatory Visit: Payer: PRIVATE HEALTH INSURANCE | Admitting: Family Medicine

## 2020-07-28 ENCOUNTER — Encounter: Payer: Self-pay | Admitting: Family Medicine

## 2020-07-28 ENCOUNTER — Ambulatory Visit (INDEPENDENT_AMBULATORY_CARE_PROVIDER_SITE_OTHER): Payer: PRIVATE HEALTH INSURANCE | Admitting: Family Medicine

## 2020-07-28 ENCOUNTER — Other Ambulatory Visit: Payer: Self-pay | Admitting: Cardiovascular Disease

## 2020-07-28 VITALS — BP 130/80 | HR 63 | Ht 70.0 in | Wt 231.0 lb

## 2020-07-28 DIAGNOSIS — G629 Polyneuropathy, unspecified: Secondary | ICD-10-CM

## 2020-07-28 DIAGNOSIS — G894 Chronic pain syndrome: Secondary | ICD-10-CM | POA: Diagnosis not present

## 2020-07-28 DIAGNOSIS — N529 Male erectile dysfunction, unspecified: Secondary | ICD-10-CM

## 2020-07-28 DIAGNOSIS — M5417 Radiculopathy, lumbosacral region: Secondary | ICD-10-CM

## 2020-07-28 MED ORDER — HYDROCODONE-ACETAMINOPHEN 5-325 MG PO TABS: 1.0000 | ORAL_TABLET | ORAL | 0 refills | Status: DC | PRN

## 2020-07-28 MED ORDER — GABAPENTIN 300 MG PO CAPS: ORAL_CAPSULE | ORAL | 1 refills | Status: DC

## 2020-07-28 MED ORDER — LAMOTRIGINE 150 MG PO TABS: 150.0000 mg | ORAL_TABLET | Freq: Two times a day (BID) | ORAL | 1 refills | Status: DC

## 2020-07-28 NOTE — Patient Instructions (Signed)
Below is our plan:  We will continue current treatment plan.   Please make sure you are staying well hydrated. I recommend 50-60 ounces daily. Well balanced diet and regular exercise encouraged. Consistent sleep schedule with 6-8 hours recommended.   Please continue follow up with care team as directed.   Follow up with Dr Epimenio Foot in 6 months   You may receive a survey regarding today's visit. I encourage you to leave honest feed back as I do use this information to improve patient care. Thank you for seeing me today!     Peripheral Neuropathy Peripheral neuropathy is a type of nerve damage. It affects nerves that carry signals between the spinal cord and the arms, legs, and the rest of the body (peripheral nerves). It does not affect nerves in the spinal cord or brain. In peripheral neuropathy, one nerve or a group of nerves may be damaged. Peripheral neuropathy is a broad category that includes many specific nerve disorders, like diabetic neuropathy, hereditary neuropathy, and carpal tunnel syndrome. What are the causes? This condition may be caused by:  Diabetes. This is the most common cause of peripheral neuropathy.  Nerve injury.  Pressure or stress on a nerve that lasts a long time.  Lack (deficiency) of B vitamins. This can result from alcoholism, poor diet, or a restricted diet.  Infections.  Autoimmune diseases, such as rheumatoid arthritis and systemic lupus erythematosus.  Nerve diseases that are passed from parent to child (inherited).  Some medicines, such as cancer medicines (chemotherapy).  Poisonous (toxic) substances, such as lead and mercury.  Too little blood flowing to the legs.  Kidney disease.  Thyroid disease. In some cases, the cause of this condition is not known. What are the signs or symptoms? Symptoms of this condition depend on which of your nerves is damaged. Common symptoms include:  Loss of feeling (numbness) in the feet, hands, or  both.  Tingling in the feet, hands, or both.  Burning pain.  Very sensitive skin.  Weakness.  Not being able to move a part of the body (paralysis).  Muscle twitching.  Clumsiness or poor coordination.  Loss of balance.  Not being able to control your bladder.  Feeling dizzy.  Sexual problems. How is this diagnosed? Diagnosing and finding the cause of peripheral neuropathy can be difficult. Your health care provider will take your medical history and do a physical exam. A neurological exam will also be done. This involves checking things that are affected by your brain, spinal cord, and nerves (nervous system). For example, your health care provider will check your reflexes, how you move, and what you can feel. You may have other tests, such as:  Blood tests.  Electromyogram (EMG) and nerve conduction tests. These tests check nerve function and how well the nerves are controlling the muscles.  Imaging tests, such as CT scans or MRI to rule out other causes of your symptoms.  Removing a small piece of nerve to be examined in a lab (nerve biopsy).  Removing and examining a small amount of the fluid that surrounds the brain and spinal cord (lumbar puncture). How is this treated? Treatment for this condition may involve:  Treating the underlying cause of the neuropathy, such as diabetes, kidney disease, or vitamin deficiencies.  Stopping medicines that can cause neuropathy, such as chemotherapy.  Medicine to help relieve pain. Medicines may include: ? Prescription or over-the-counter pain medicine. ? Antiseizure medicine. ? Antidepressants. ? Pain-relieving patches that are applied to painful areas  of skin.  Surgery to relieve pressure on a nerve or to destroy a nerve that is causing pain.  Physical therapy to help improve movement and balance.  Devices to help you move around (assistive devices). Follow these instructions at home: Medicines  Take over-the-counter  and prescription medicines only as told by your health care provider. Do not take any other medicines without first asking your health care provider.  Do not drive or use heavy machinery while taking prescription pain medicine. Lifestyle  Do not use any products that contain nicotine or tobacco, such as cigarettes and e-cigarettes. Smoking keeps blood from reaching damaged nerves. If you need help quitting, ask your health care provider.  Avoid or limit alcohol. Too much alcohol can cause a vitamin B deficiency, and vitamin B is needed for healthy nerves.  Eat a healthy diet. This includes: ? Eating foods that are high in fiber, such as fresh fruits and vegetables, whole grains, and beans. ? Limiting foods that are high in fat and processed sugars, such as fried or sweet foods.   General instructions  If you have diabetes, work closely with your health care provider to keep your blood sugar under control.  If you have numbness in your feet: ? Check every day for signs of injury or infection. Watch for redness, warmth, and swelling. ? Wear padded socks and comfortable shoes. These help protect your feet.  Develop a good support system. Living with peripheral neuropathy can be stressful. Consider talking with a mental health specialist or joining a support group.  Use assistive devices and attend physical therapy as told by your health care provider. This may include using a walker or a cane.  Keep all follow-up visits as told by your health care provider. This is important.   Contact a health care provider if:  You have new signs or symptoms of peripheral neuropathy.  You are struggling emotionally from dealing with peripheral neuropathy.  Your pain is not well-controlled. Get help right away if:  You have an injury or infection that is not healing normally.  You develop new weakness in an arm or leg.  You have fallen or do so frequently. Summary  Peripheral neuropathy is when  the nerves in the arms, or legs are damaged, resulting in numbness, weakness, or pain.  There are many causes of peripheral neuropathy, including diabetes, pinched nerves, vitamin deficiencies, autoimmune disease, and hereditary conditions.  Diagnosing and finding the cause of peripheral neuropathy can be difficult. Your health care provider will take your medical history, do a physical exam, and do tests, including blood tests and nerve function tests.  Treatment involves treating the underlying cause of the neuropathy and taking medicines to help control pain. Physical therapy and assistive devices may also help. This information is not intended to replace advice given to you by your health care provider. Make sure you discuss any questions you have with your health care provider. Document Revised: 01/05/2020 Document Reviewed: 01/05/2020 Elsevier Patient Education  2021 [employer].

## 2020-07-28 NOTE — Progress Notes (Signed)
PATIENT: Jeffrey Good DOB: May 16, 1950  REASON FOR VISIT: follow up HISTORY FROM: patient  Chief Complaint  Patient presents with  . Follow-up    RM 1 alone  Pt is well, things are stable.      HISTORY OF PRESENT ILLNESS: 07/28/20 ALL: He returns for follow up. He reports neuropathy pain is stable. He feels that his feet feel hot from time to time but it isn't too bad on medication. He is taking lamotrigine, gabapentin and hydrocodone as prescribed. Meloxicam helps with back pain. He feels that meds work better together. 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 He denies sedative side effects. He takes hydrocodone at night only. Viagra PRN for ED. No adverse effects noted.   01/21/2019 RS: He feels the polyneuropathy is stable.   He gets a burning sensation at times but not as bad as before the medication.     Lamotrigine 150 mg helped incompletely and he has done better with the addition of gabapentin.    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  The leg pain is fairly symmetric.   He has no weakness.   Pain is worse at night and when he first wakes up and sometimes after a long day of driving.Marland Kitchen  He sometimes needs a hydrocodone when pain is bothering him more.      He denies weakness in his legs.    He does not note bladder changes but has ED, helped by Viagra.     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    Hydrocodone has helped the neuropathic pain as well as the back pain.     The PDMP was reviewed and he is compliant.    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      History of dysesthesia:     Early 2017, he had the onset of a burning sensation in his feet.  He did not note any change in the color of most of the toes though the tip of the big toe did seem to be a mildly different color. Sometimes, he gets a sensation of swelling in the big toes.  Initially, he was tried on Lyrica but had stomach upset and stopped. Then, he was tried on gabapentin. 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 He does not think he was on either of these medications long enough to know where the not he got a benefit.  Lamotrigine combined with hydrocodone has helped best..        Studies :    ESR, cryoglobulins, SPEP/IEF and ANA were normal.    An NCV/EMG was more consistent with mild S1 chronic radiculopathy. There was not evidence of a large fiber polyneuropathy, though a small fiber polyneuropathy is still possible.   07/22/2019 ALL:  Jeffrey Good is a 70 y.o. male here today for follow up for dysesthesias and back pain. He continues lamotrigine 121m twice daily and gabapentin 1 in the morning and 2 at night. He also takes hydrocodone [id number] times daily. Last refill 06/18/2019. He has tried  He continues to have "a little heat" in his feet. He feels that his back pain is actually better. He is waling every morning for about a mile. He tries to walk again in the evenings. He has  been out of work due to perforated appendix.   He was hospitalized in 06/2019 for sepsis due to perforated appendix requiring percutaneous drainage. He is being followed by general surgery for consideration of an appendectomy. He was seen yesterday and will return for evaluation on 4/21.   HISTORY: (copied from my note on 11/19/2018)  Jeffrey Good is a 70 y.o. male here today for follow up for dysesthesias and back pain. He continues lamotrigine 150mg twice daily and gabapentin 300mg BID. He is prescribed 300mg three times daily but hasn't increased dose. He uses hydrocodone as needed for pain. Last refill 11/10/2018 for 45 tablets. He states that he usually takes 1 everyday and sometimes he takes two tablets. He feels that back pain and neuropathy are stable. He is walking about 2 miles daily for exercise. He is a truck driver. He does use  Viagra on occasion for ED.    HISTORY: (copied from Dr Sater's note on 05/20/2018)  Jeffrey Good is a 70 yo man with burning dysesthesias and back pain.  Update 05/20/2018: He is having numbness and tingling dysesthesia in his feet. Lamotrigine 150 mg helped incompletely. More recently, gabapentin was started and she is on 300 mg po tid. 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 The leg pain is fairly symmetric. Pain is worse at night and when he first wakes up and sometimes after a long day of driving.. He sometimes needs a hydrocodone when pain is bothering him more.   He denies weakness in his legs. He does not note bladder changes but has ED, helped by Viagra. 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    Update 11/13/2017: He has numbness in the feet and mild dysesthesia. Lamotrigine 150 mg twice daily has helped a lot. He tolerates it well. He denies any significant weakness. He feels the balance and gait are doing well. He walks up to 3 miles a day and also uses an exercise bike in bad weather. The foot pain is usually worse at night and he takes hydrocodone with benefit. 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 Viagra has been helpful. 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  He has some low back pain and feels it is doing about the same. Moving around helps the back pain when laying down worsens it. He notes it for at night. He is working as a truck driver and needs to take some breaks to move around.  Update 05/14/2017:  He feels his dysesthetic nerve pain is doing well on lamotrigine 150 mg po bid. He tolerates it well. He notes no worsening numbness. He denies any change in strength. Balance  and gait are good. He walks 3 miles and/or rides exercise bike x 45 minutes daily. He takes hydrocodone many nights with benefit as his foot pain is worse when he lays down  His LBP is about the same . It bothers him more at night or when laying down. He does better with movements. Sitting is not as bad. He works as a truck driver and likes to take breaks to move around.   He also has had ED since the neuropathy started. Viagra 50-100 mg usually helps a lot.   From 11/07/2016: Dysesthetic Pain: He has a burning   pain in both feet. He feels that this is doing better on lamotrigine 150 mg twice a day that on a lower dose. There are still some days that hurt more and hydrocodone will help. Pain is worse on the bottom of the foot than the top of the foot. He does not note any weakness. He does not feel that this has worsened. He feels he walks well. He does not have any weakness. Balance is fine. He has not had any bladder or bowel changes. Etiology of the neuropathy is unknown. Lab work have been normal and he does not have diabetes or other systemic illnesses .   LBP: LBP is doing better. 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 MRI shows degenerative changes at L5-S1. Nerve conduction study / EMG showed a mild S1 chronic radiculopathy.  ED: He reports erectile dysfunction that is helped by Viagra.   History of dysesthesia: Early 2017, he had the onset of a burning sensation in his feet. He did not note any change in the color of most of the toes though the tip of the big toe did seem to be a mildly different color. Sometimes, he gets a sensation of swelling in the big toes. Initially, he was tried on Lyrica but had stomach upset and stopped. Then, he was tried on gabapentin. 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 He does not think  he was on either of these medications long enough to know where the not he got a benefit. Lamotrigine combined with hydrocodone has helped best..   Studies : ESR, cryoglobulins, SPEP/IEF and ANA were normal. An NCV/EMG was more consistent with mild S1 chronic radiculopathy. There was not evidence of a large fiber polyneuropathy, though a small fiber polyneuropathy is still possible.    REVIEW OF SYSTEMS: Out of a complete 14 system review of symptoms, the patient complains only of the following symptoms, numbness, burning, back pain and all other reviewed systems are negative.  ALLERGIES: Allergies  Allergen Reactions  . Pregabalin Nausea Only    HOME MEDICATIONS: Outpatient Medications Prior to Visit  Medication Sig Dispense Refill  . carvedilol (COREG) 25 MG tablet TAKE 1 TABLET BY MOUTH TWICE A DAY 60 tablet 0  . Cetirizine HCl 10 MG CAPS Take 10 mg by mouth daily.     . doxazosin (CARDURA XL) 4 MG 24 hr tablet Take 1 tablet (4 mg total) by mouth daily with breakfast. 30 tablet 11  . lisinopril (ZESTRIL) 20 MG tablet TAKE 1 TABLET BY MOUTH TWICE A DAY 60 tablet 0  . meloxicam (MOBIC) 7.5 MG tablet Take 1 tablet (7.5 mg total) by mouth daily. 30 tablet 11  . sildenafil (VIAGRA) 100 MG tablet One half to one po prn before intercourse 10 tablet 11  . spironolactone (ALDACTONE) 25 MG tablet TAKE 1 TABLET BY MOUTH EVERY DAY 30 tablet 9  . sulfamethoxazole-trimethoprim (BACTRIM DS) 800-160 MG tablet Take 1 tablet by mouth 2 (two) times daily.    . acetaminophen (TYLENOL) 325 MG tablet Take 2 tablets (650 mg total) by mouth every 6 (six) hours as needed for mild pain (or temp > 100).    . gabapentin (NEURONTIN) 300 MG capsule TAKE 1 CAPSULE BY MOUTH THREE TIMES A DAY 270 capsule 1  . HYDROcodone-acetaminophen (NORCO/VICODIN) 5-325 MG tablet Take 1-2 tablets by mouth as needed for moderate   pain. 45 tablet 0  . lamoTRIgine (LAMICTAL) 150 MG tablet Take 1 tablet (150 mg total) by  mouth 2 (two) times daily. 60 tablet 0  . ibuprofen (ADVIL) 800 MG tablet Take 1 tablet (800 mg total) by mouth every 8 (eight) hours as needed. (Patient not taking: Reported on 07/28/2020) 30 tablet 0   No facility-administered medications prior to visit.    PAST MEDICAL HISTORY: Past Medical History:  Diagnosis Date  . Cataract    Mixed form OU  . Erectile dysfunction   . Hyperlipidemia   . Hypertension   . Hypertensive retinopathy    OU  . Seasonal allergies     PAST SURGICAL HISTORY: Past Surgical History:  Procedure Laterality Date  . IR RADIOLOGIST EVAL & MGMT  07/16/2019  . IR RADIOLOGIST EVAL & MGMT  07/29/2019  . KNEE ARTHROSCOPY     1989 left  . LAPAROSCOPIC APPENDECTOMY N/A 10/15/2019   Procedure: LAPAROSCOPIC APPENDECTOMY;  Surgeon: Cornett, Thomas, MD;  Location: [facility] OR;  Service: General;  Laterality: N/A;    FAMILY HISTORY: Family History  Problem Relation Age of Onset  . Hypertension Mother   . Diabetes Mother     SOCIAL HISTORY: Social History   Socioeconomic History  . Marital status: Married    Spouse name: Not on file  . Number of children: Not on file  . Years of education: Not on file  . Highest education level: Not on file  Occupational History  . Not on file  Tobacco Use  . Smoking status: Never Smoker  . Smokeless tobacco: Never Used  Vaping Use  . Vaping Use: Never used  Substance and Sexual Activity  . Alcohol use: Yes    Alcohol/week: 1.0 standard drink    Types: 1 Cans of beer per week    Comment: very rare  . Drug use: No  . Sexual activity: Yes  Other Topics Concern  . Not on file  Social History Narrative  . Not on file   Social Determinants of Health   Financial Resource Strain: Not on file  Food Insecurity: Not on file  Transportation Needs: Not on file  Physical Activity: Not on file  Stress: Not on file  Social Connections: Not on file  Intimate Partner Violence: Not on file      PHYSICAL EXAM  Vitals:    07/28/20 1521  BP: 130/80  Pulse: 63  Weight: 231 lb (104.8 kg)  Height: 5' 10" (1.778 m)   Body mass index is 33.15 kg/m.  Generalized: Well developed, in no acute distress  Cardiology: normal rate and rhythm, no murmur noted Respiratory: clear to auscultation bilaterally  Neurological examination  Mentation: Alert oriented to time, place, history taking. Follows all commands speech and language fluent Cranial nerve II-XII: Pupils were equal round reactive to light. Extraocular movements were full, visual field were full on confrontational test. Facial sensation and strength were normal. Uvula tongue midline. Head turning and shoulder shrug  were normal and symmetric. Motor: The motor testing reveals 5 over 5 strength of all 4 extremities. Good symmetric motor tone is noted throughout.  Sensory: Sensory testing is intact to soft touch on all 4 extremities. No evidence of extinction is noted.  Coordination: Cerebellar testing reveals good finger-nose-finger and heel-to-shin bilaterally.  Gait and station: Gait is normal. .   DIAGNOSTIC DATA (LABS, IMAGING, TESTING) - I reviewed patient records, labs, notes, testing and imaging myself where available.  No flowsheet data found.   Lab Results    Component Value Date   WBC 5.1 10/15/2019   HGB 11.9 (L) 10/15/2019   HCT 38.6 (L) 10/15/2019   MCV 90.0 10/15/2019   PLT 158 10/15/2019      Component Value Date/Time   NA 139 10/15/2019 0629   K 4.2 10/15/2019 0629   CL 105 10/15/2019 0629   CO2 26 10/15/2019 0629   GLUCOSE 97 10/15/2019 0629   BUN 17 10/15/2019 0629   CREATININE 1.00 10/15/2019 0629   CREATININE 1.16 04/05/2016 1408   CALCIUM 9.2 10/15/2019 0629   PROT 7.3 10/15/2019 0629   PROT 7.3 11/16/2014 1635   ALBUMIN 3.6 10/15/2019 0629   AST 20 10/15/2019 0629   ALT 22 10/15/2019 0629   ALKPHOS 58 10/15/2019 0629   BILITOT 0.4 10/15/2019 0629   GFRNONAA >60 10/15/2019 0629   GFRAA >60 10/15/2019 0629   Lab Results   Component Value Date   CHOL 126 09/01/2014   HDL 34.00 (L) 09/01/2014   LDLCALC 71 09/01/2014   TRIG 103.0 09/01/2014   CHOLHDL 4 09/01/2014   No results found for: HGBA1C No results found for: VITAMINB12 No results found for: TSH     ASSESSMENT AND PLAN 69 y.o. year old male  has a past medical history of Cataract, Erectile dysfunction, Hyperlipidemia, Hypertension, Hypertensive retinopathy, and Seasonal allergies. here with     ICD-10-CM   1. 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 Chronic pain syndrome  G89.4   3. Lumbosacral radiculopathy at S1  M54.17   4. Erectile dysfunction, unspecified erectile dysfunction type  N52.9      Mr Filsaime is doing well today.  We will continue lamotrigine 150mg BID, gabapentin 300mg TID, meloxicam 7.5mg daily and hydrocodone 1-1.5 tablets daily as prescribed.  I have reviewed PDMP aware and appropriate refills have been requested.  I have sent in 1 month supply as requested today.  He was advised to use hydrocodone sparingly. He will continue Viagra as needed. He will stay well-hydrated.  I have encouraged him to stay as active as possible.  He will follow-up with Dr. Sater in 6 months, sooner if needed.  He verbalizes understanding and agreement with this plan.   No orders of the defined types were placed in this encounter.    Meds ordered this encounter  Medications  . gabapentin (NEURONTIN) 300 MG capsule    Sig: TAKE 1 CAPSULE BY MOUTH THREE TIMES A DAY    Dispense:  270 capsule    Refill:  1    Order Specific Question:   Supervising Provider    Answer:   AHERN, ANTONIA B [[id number]]  . lamoTRIgine (LAMICTAL) 150 MG tablet    Sig: Take 1 tablet (150 mg total) by mouth 2 (two) times daily.    Dispense:  180 tablet    Refill:  1    Order Specific Question:   Supervising Provider    Answer:   AHERN, ANTONIA B [[id number]]  . HYDROcodone-acetaminophen (NORCO/VICODIN) 5-325 MG tablet    Sig: Take 1-2 tablets by mouth as needed for moderate pain.     Dispense:  45 tablet    Refill:  0    #45/30days    Order Specific Question:   Supervising Provider    Answer:   AHERN, ANTONIA B [[id number]]      I spent 25 minutes with the patient. 50% of this time was spent counseling and educating patient on plan of care and medications.     , FNP-C 07/28/2020, 3:52 PM   [facility] [address] [place], [place] [address] [phone]

## 2020-07-31 NOTE — Progress Notes (Signed)
I have read the note, and I agree with the clinical assessment and plan.  Biance Moncrief A. Lennix Rotundo, MD, PhD, FAAN Certified in Neurology, Clinical Neurophysiology, Sleep Medicine, Pain Medicine and Neuroimaging  [facility] [address], [address] [place],  [address] [phone]  

## 2020-08-02 ENCOUNTER — Ambulatory Visit (INDEPENDENT_AMBULATORY_CARE_PROVIDER_SITE_OTHER): Payer: PRIVATE HEALTH INSURANCE | Admitting: Cardiovascular Disease

## 2020-08-02 ENCOUNTER — Other Ambulatory Visit: Payer: Self-pay

## 2020-08-02 ENCOUNTER — Encounter: Payer: Self-pay | Admitting: Cardiovascular Disease

## 2020-08-02 VITALS — BP 142/90 | HR 63 | Ht 70.0 in | Wt 229.0 lb

## 2020-08-02 DIAGNOSIS — I10 Essential (primary) hypertension: Secondary | ICD-10-CM

## 2020-08-02 DIAGNOSIS — E782 Mixed hyperlipidemia: Secondary | ICD-10-CM | POA: Diagnosis not present

## 2020-08-02 MED ORDER — LISINOPRIL 20 MG PO TABS: 1.0000 | ORAL_TABLET | Freq: Two times a day (BID) | ORAL | 0 refills | Status: DC

## 2020-08-02 MED ORDER — SILDENAFIL CITRATE 100 MG PO TABS: ORAL_TABLET | ORAL | 3 refills | Status: DC

## 2020-08-02 MED ORDER — SPIRONOLACTONE 25 MG PO TABS: 1.0000 | ORAL_TABLET | Freq: Every day | ORAL | 9 refills | Status: DC

## 2020-08-02 MED ORDER — DOXAZOSIN MESYLATE ER 4 MG PO TB24: 4.0000 mg | ORAL_TABLET | Freq: Every day | ORAL | 0 refills | Status: DC

## 2020-08-02 MED ORDER — CARVEDILOL 25 MG PO TABS: 25.0000 mg | ORAL_TABLET | Freq: Two times a day (BID) | ORAL | 0 refills | Status: DC
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
## 2020-08-02 NOTE — Patient Instructions (Signed)
   Medication Instructions:  Your physician recommends that you continue on your current medications as directed. Please refer to the Current Medication list given to you today.  *If you need a refill on your cardiac medications before your next appointment, please call your pharmacy*   Lab Work: none If you have labs (blood work) drawn today and your tests are completely normal, you will receive your results only by: MyChart Message (if you have MyChart) OR A paper copy in the mail If you have any lab test that is abnormal or we need to change your treatment, we will call you to review the results.   Testing/Procedures: none   Follow-Up: At [facility], you and your health needs are our priority.  As part of our continuing mission to provide you with exceptional heart care, we have created designated Provider Care Teams.  These Care Teams include your primary Cardiologist (physician) and Advanced Practice Providers (APPs -  Physician Assistants and Nurse Practitioners) who all work together to provide you with the care you need, when you need it.  We recommend signing up for the patient portal called "MyChart".  Sign up information is provided on this After Visit Summary.  MyChart is used to connect with patients for Virtual Visits (Telemedicine).  Patients are able to view lab/test results, encounter notes, upcoming appointments, etc.  Non-urgent messages can be sent to your provider as well.   To learn more about what you can do with MyChart, go to [email or web address].    Your next appointment:   1 year(s)  The format for your next appointment:   In Person  Provider:   You may see Philip Nahser, MD or one of the following Advanced Practice Providers on your designated Care Team:   Scott Weaver, PA-C Vin Bhagat, PA-C 

## 2020-08-17 NOTE — Progress Notes (Signed)
[facility] - Clinic Note  08/19/2020     CHIEF COMPLAINT Patient presents for Retina Follow Up   HISTORY OF PRESENT ILLNESS: Jeffrey Good is a 70 y.o. male who presents to the clinic today for:   HPI    Retina Follow Up    Patient presents with  PVD.  In right eye.  This started 3 months ago.  Since onset it is stable.  I, the attending physician,  performed the HPI with the patient and updated documentation appropriately.          Comments    Pt here for 3 mo retinal f/u for retinal ischemia OD. Pt states vision is doing well, doesn't notice any changes. No ocular pain or discomfort noted.        Last edited by Rennis Chris, MD on 08/19/2020  4:54 PM. (History)    pt states no problems after laser procedure, no new vision problems  Referring physician: Deatra James, MD [phone] [address] [place],  [place] [address]  HISTORICAL INFORMATION:   Selected notes from the MEDICAL RECORD NUMBER Referred by Dr. Conley Rolls for eval of floaters/ischemia/heme OU.   CURRENT MEDICATIONS: No current outpatient medications on file. (Ophthalmic Drugs)   No current facility-administered medications for this visit. (Ophthalmic Drugs)   Current Outpatient Medications (Other)  Medication Sig  . carvedilol (COREG) 25 MG tablet Take 1 tablet (25 mg total) by mouth 2 (two) times daily. Please keep upcoming appointment for future refills. Thank you  . Cetirizine HCl 10 MG CAPS Take 10 mg by mouth daily.   Marland Kitchen doxazosin (CARDURA XL) 4 MG 24 hr tablet Take 1 tablet (4 mg total) by mouth daily with breakfast. Please keep upcoming appointment in April 2022 for future refills. Thank you  . gabapentin (NEURONTIN) 300 MG capsule TAKE 1 CAPSULE BY MOUTH THREE TIMES A DAY  . HYDROcodone-acetaminophen (NORCO/VICODIN) 5-325 MG tablet Take 1-2 tablets by mouth as needed for moderate pain.  Marland Kitchen lamoTRIgine (LAMICTAL) 150 MG tablet Take 1 tablet (150 mg total) by mouth 2 (two) times daily.   Marland Kitchen lisinopril (ZESTRIL) 20 MG tablet Take 1 tablet (20 mg total) by mouth 2 (two) times daily.  . meloxicam (MOBIC) 7.5 MG tablet Take 1 tablet (7.5 mg total) by mouth daily.  . sildenafil (VIAGRA) 100 MG tablet One half to one po prn before intercourse  . spironolactone (ALDACTONE) 25 MG tablet Take 1 tablet (25 mg total) by mouth daily.  Marland Kitchen sulfamethoxazole-trimethoprim (BACTRIM DS) 800-160 MG tablet Take 1 tablet by mouth 2 (two) times daily.   No current facility-administered medications for this visit. (Other)      REVIEW OF SYSTEMS: ROS    Positive for: Musculoskeletal, Eyes, Allergic/Imm   Negative for: Constitutional, Gastrointestinal, Neurological, Skin, Genitourinary, HENT, Endocrine, Cardiovascular, Respiratory, Psychiatric, Heme/Lymph   Last edited by Thompson Grayer, COT on 08/19/2020  8:47 AM. (History)       ALLERGIES Allergies  Allergen Reactions  . Pregabalin Nausea Only    PAST MEDICAL HISTORY Past Medical History:  Diagnosis Date  . Cataract    Mixed form OU  . Erectile dysfunction   . Hyperlipidemia   . Hypertension   . Hypertensive retinopathy    OU  . Seasonal allergies    Past Surgical History:  Procedure Laterality Date  . IR RADIOLOGIST EVAL & MGMT  07/16/2019  . IR RADIOLOGIST EVAL & MGMT  07/29/2019  . KNEE ARTHROSCOPY  1989 left  . LAPAROSCOPIC APPENDECTOMY N/A 10/15/2019   Procedure: LAPAROSCOPIC APPENDECTOMY;  Surgeon: Harriette Bouillonornett, Thomas, MD;  Location: [facility] OR;  Service: General;  Laterality: N/A;    FAMILY HISTORY Family History  Problem Relation Age of Onset  . Hypertension Mother   . Diabetes Mother     SOCIAL HISTORY Social History   Tobacco Use  . Smoking status: Never Smoker  . Smokeless tobacco: Never Used  Vaping Use  . Vaping Use: Never used  Substance Use Topics  . Alcohol use: Yes    Alcohol/week: 1.0 standard drink    Types: 1 Cans of beer per week    Comment: very rare  . Drug use: No         OPHTHALMIC  EXAM:  Base Eye Exam    Visual Acuity (Snellen - Linear)      Right Left   Dist [place] 20/20 -2 20/20   Correction: Glasses       Tonometry (Tonopen, 8:55 AM)      Right Left   Pressure 17 12       Pupils      Dark Light Shape React APD   Right 3 2 Round Brisk None   Left 3 2 Round Brisk None       Visual Fields (Counting fingers)      Left Right    Full Full       Extraocular Movement      Right Left    Full, Ortho Full, Ortho       Neuro/Psych    Oriented x3: Yes   Mood/Affect: Normal       Dilation    Both eyes: 1.0% Mydriacyl, 2.5% Phenylephrine @ 8:54 AM        Slit Lamp and Fundus Exam    Slit Lamp Exam      Right Left   Lids/Lashes Dermatochalasis - upper lid Dermatochalasis - upper lid   Conjunctiva/Sclera mild Melanosis nasal and temporal pinguecula, mild melanosis   Cornea arcus, mild Debris in tear film arcus, mild Debris in tear film, 1+ Punctate epithelial erosions   Anterior Chamber Deep and quiet Deep and quiet   Iris Round and moderately dilated Round and moderately dilated   Lens 2+ Nuclear sclerosis, 2+ Cortical cataract 2+ Nuclear sclerosis, 2+ Cortical cataract   Vitreous Vitreous syneresis, Posterior vitreous detachment, mild improvement in vitreous condensations Vitreous syneresis       Fundus Exam      Right Left   Disc Pink and Sharp, Compact, corkscrew vessel nasally Pink and Sharp, Compact, focal temporal PPP   C/D Ratio 0.2 0.2   Macula Flat, Good foveal reflex, mild RPE mottling, No heme or edema Flat, Good foveal reflex, mild RPE mottling, No heme or edema   Vessels attenuated, mild tortuousity, sclerosis SN peripheray attenuated, Tortuous, mild Copper wiring   Periphery Attached, peripheral drusen, sclerotic vessels superonasal periphery -- good segmental PRP laser changes; +focal blot hemes and punctate exudates SN quad, No RT/RD Attached, focal punctate CR atrophy superior to disc             IMAGING AND PROCEDURES  Imaging  and Procedures for 08/19/2020  OCT, Retina - OU - Both Eyes       Right Eye Quality was good. Central Foveal Thickness: 276. Progression has been stable. Findings include normal foveal contour, no IRF, no SRF (Interval improvement in vitreous opacities).   Left Eye Quality was good. Central Foveal Thickness: 268. Progression has  been stable. Findings include normal foveal contour, no IRF, no SRF, vitreomacular adhesion .   Notes *Images captured and stored on drive  Diagnosis / Impression:  NFP, no IRF/SRF OU OD: interval improvement in vitreous opacities  Clinical management:  See below  Abbreviations: NFP - Normal foveal profile. CME - cystoid macular edema. PED - pigment epithelial detachment. IRF - intraretinal fluid. SRF - subretinal fluid. EZ - ellipsoid zone. ERM - epiretinal membrane. ORA - outer retinal atrophy. ORT - outer retinal tubulation. SRHM - subretinal hyper-reflective material. IRHM - intraretinal hyper-reflective material        Fluorescein Angiography Optos (Transit OD)       Right Eye   Progression has improved. Early phase findings include delayed filling, vascular perfusion defect, microaneurysm (Delayed venous return, telangectasia's). Mid/Late phase findings include vascular perfusion defect, microaneurysm, leakage, staining (Telangectasia's, staining of segmental laser SN quad, interval improvement in MA's and leakage, no NV).   Left Eye   Progression has been stable. Early phase findings include microaneurysm. Mid/Late phase findings include microaneurysm.   Notes **Images stored on drive**  Impression: [id number], staining of segmental laser SN quad, interval improvement in MA's and leakage, no NV  -- remote BRVO OS: +MAs                  ASSESSMENT/PLAN:    ICD-10-CM   1. Posterior vitreous detachment of right eye  H43.811   2. Retinal ischemia  H35.82   3. 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 Proliferative retinopathy of right eye  H35.21   5. Retinal edema  H35.81 OCT, Retina - OU - Both Eyes  6. Essential hypertension  I10   7. Hypertensive retinopathy of both eyes  H35.033 Fluorescein Angiography Optos (Transit OD)  8. Combined forms of age-related cataract of both eyes  H25.813     1. PVD / vitreous syneresis OD  - history of floaters OD, onset ~1st of Oct 2021 -- persistent, but improved from initial onset  - Discussed findings and prognosis  - No RT or RD on 360 peripheral exam  - Reviewed s/s of RT/RD  - Strict return precautions for any such RT/RD signs/symptoms  2-4. Retinal Ischemia, remote BRVO w/ proliferative retinpathy OD  - exam shows sclerotic vessels in SN peripheral quad  - FA 10.19.21 shows telangectasias w/ late leakage, vascular perfusion defects in superonasal peripheral quad w/ early NV-- suggestive of remote BRVO   - optic disc with nasal corkscrew vessel  - s/p PRP OD (12.08.21) -- good laser changes in place  - repeat FA 5.13.22 shows interval improvement in MA and leakage SN peripheral quad  - f/u 6-9 months, DFE, OCT  5. No retinal edema on exam or OCT  6,7. Hypertensive retinopathy OU - discussed importance of tight BP control - monitor  8. Mixed Cataract OU - The symptoms of cataract, surgical options, and treatments and risks were discussed with patient. - discussed diagnosis and progression - monitor   Ophthalmic Meds Ordered this visit:  No orders of the defined types were placed in this encounter.     Return for f/u 6-9 months, retinal ischemia OD, DFE, OCT.  There are no Patient Instructions on file for this visit.  This document serves as a record of services personally performed by Karie Chimera, MD, PhD. It was created on their behalf by Joni Reining, an ophthalmic technician. The creation of this record is the provider's dictation  and/or activities during the visit.    Electronically signed by: Joni Reining COA,  08/19/20  4:55 PM   This document serves as a record of services personally performed by Karie Chimera, MD, PhD. It was created on their behalf by Glee Arvin. Manson Passey, OA an ophthalmic technician. The creation of this record is the provider's dictation and/or activities during the visit.    Electronically signed by: Glee Arvin. Manson Passey, [place] 05.13.2022 4:55 PM  Karie Chimera, M.D., Ph.D. Diseases & Surgery of the Retina and Vitreous [facility] 08/19/2020  I have reviewed the above documentation for accuracy and completeness, and I agree with the above. Karie Chimera, M.D., Ph.D. 08/19/20 4:56 PM  Abbreviations: M myopia (nearsighted); A astigmatism; H hyperopia (farsighted); P presbyopia; Mrx spectacle prescription;  CTL contact lenses; OD right eye; OS left eye; OU both eyes  XT exotropia; ET esotropia; PEK punctate epithelial keratitis; PEE punctate epithelial erosions; DES dry eye syndrome; MGD meibomian gland dysfunction; ATs artificial tears; PFAT's preservative free artificial tears; NSC nuclear sclerotic cataract; PSC posterior subcapsular cataract; ERM epi-retinal membrane; PVD posterior vitreous detachment; RD retinal detachment; DM diabetes mellitus; DR diabetic retinopathy; NPDR non-proliferative diabetic retinopathy; PDR proliferative diabetic retinopathy; CSME clinically significant macular edema; DME diabetic macular edema; dbh dot blot hemorrhages; CWS cotton wool spot; POAG primary open angle glaucoma; C/D cup-to-disc ratio; HVF humphrey visual field; GVF goldmann visual field; OCT optical coherence tomography; IOP intraocular pressure; BRVO Branch retinal vein occlusion; CRVO central retinal vein occlusion; CRAO central retinal artery occlusion; BRAO branch retinal artery occlusion; RT retinal tear; SB scleral buckle; PPV pars plana vitrectomy; VH Vitreous hemorrhage; PRP panretinal laser photocoagulation; IVK intravitreal kenalog; VMT vitreomacular traction; MH  Macular hole;  NVD neovascularization of the disc; NVE neovascularization elsewhere; AREDS age related eye disease study; ARMD age related macular degeneration; POAG primary open angle glaucoma; EBMD epithelial/anterior basement membrane dystrophy; ACIOL anterior chamber intraocular lens; IOL intraocular lens; PCIOL posterior chamber intraocular lens; Phaco/IOL phacoemulsification with intraocular lens placement; PRK photorefractive keratectomy; LASIK laser assisted in situ keratomileusis; HTN hypertension; DM diabetes mellitus; COPD chronic obstructive pulmonary disease

## 2020-08-19 ENCOUNTER — Ambulatory Visit (INDEPENDENT_AMBULATORY_CARE_PROVIDER_SITE_OTHER): Payer: PRIVATE HEALTH INSURANCE | Admitting: Ophthalmology

## 2020-08-19 ENCOUNTER — Encounter (INDEPENDENT_AMBULATORY_CARE_PROVIDER_SITE_OTHER): Payer: Self-pay | Admitting: Ophthalmology

## 2020-08-19 ENCOUNTER — Other Ambulatory Visit: Payer: Self-pay

## 2020-08-19 DIAGNOSIS — H3521 Other non-diabetic proliferative retinopathy, right eye: Secondary | ICD-10-CM

## 2020-08-19 DIAGNOSIS — H3582 Retinal ischemia: Secondary | ICD-10-CM | POA: Diagnosis not present

## 2020-08-19 DIAGNOSIS — I10 Essential (primary) hypertension: Secondary | ICD-10-CM

## 2020-08-19 DIAGNOSIS — H25813 Combined forms of age-related cataract, bilateral: Secondary | ICD-10-CM

## 2020-08-19 DIAGNOSIS — H348312 Tributary (branch) retinal vein occlusion, right eye, stable: Secondary | ICD-10-CM

## 2020-08-19 DIAGNOSIS — H43811 Vitreous degeneration, right eye: Secondary | ICD-10-CM | POA: Diagnosis not present

## 2020-08-19 DIAGNOSIS — H35033 Hypertensive retinopathy, bilateral: Secondary | ICD-10-CM

## 2020-08-19 DIAGNOSIS — H3581 Retinal edema: Secondary | ICD-10-CM

## 2020-08-25 ENCOUNTER — Other Ambulatory Visit: Payer: Self-pay | Admitting: Family Medicine

## 2020-08-25 NOTE — Telephone Encounter (Signed)
Pt request refill HYDROcodone-acetaminophen (NORCO/VICODIN) 5-325 MG tablet at [facility] [phone]

## 2020-08-29 MED ORDER — HYDROCODONE-ACETAMINOPHEN 5-325 MG PO TABS: 1.0000 | ORAL_TABLET | ORAL | 0 refills | Status: DC | PRN

## 2020-09-07 ENCOUNTER — Other Ambulatory Visit: Payer: Self-pay | Admitting: Cardiovascular Disease

## 2020-09-22 ENCOUNTER — Telehealth: Payer: Self-pay

## 2020-09-22 NOTE — Telephone Encounter (Signed)
**Note De-Identified  Obfuscation** I attempted a Sildenafil PA through covermymeds but was unable as the message received stated that I would need to call [employer] at  [phone]. I called then and was advised by Rodney Booze that all PAs done through them are done online at [email or web address] I did start the pts Sildenafil PA through [employer]. EOC ID: [id number]

## 2020-09-22 NOTE — Telephone Encounter (Signed)
Received fax that prior authorization was canceled because sildenafil does not require a prior authorization. Med should be fillable at pharmacy.

## 2020-09-28 ENCOUNTER — Other Ambulatory Visit: Payer: Self-pay | Admitting: Family Medicine

## 2020-09-28 MED ORDER — HYDROCODONE-ACETAMINOPHEN 5-325 MG PO TABS: 1.0000 | ORAL_TABLET | ORAL | 0 refills | Status: DC | PRN

## 2020-09-28 NOTE — Telephone Encounter (Signed)
Received refill request for HYDROcodone-acetaminophen 5-325mg . Last OV was on 07/28/20 with Amy,NP.  Next OV is scheduled for Dr. Epimenio Foot with 01/24/21.  Last RX was written on 09/29/20 for 45 tabs.   [place] [facility] Database has been reviewed.

## 2020-09-28 NOTE — Addendum Note (Signed)
Addended by: Melanee Spry on: 09/28/2020 03:28 PM   Modules accepted: Orders

## 2020-09-28 NOTE — Telephone Encounter (Signed)
Pt is requesting a refill for HYDROcodone-acetaminophen (NORCO/VICODIN) 5-325 MG tablet .  [id number] 

## 2020-10-10 ENCOUNTER — Other Ambulatory Visit: Payer: Self-pay | Admitting: Cardiovascular Disease

## 2020-10-28 ENCOUNTER — Other Ambulatory Visit: Payer: Self-pay | Admitting: Family Medicine

## 2020-10-28 ENCOUNTER — Other Ambulatory Visit: Payer: Self-pay | Admitting: Cardiovascular Disease

## 2020-10-28 NOTE — Telephone Encounter (Signed)
Pt has requested a refill on his HYDROcodone-acetaminophen (NORCO/VICODIN) 5-325 MG tablet [phone]

## 2020-10-31 ENCOUNTER — Telehealth: Payer: Self-pay | Admitting: Family Medicine

## 2020-10-31 ENCOUNTER — Other Ambulatory Visit: Payer: Self-pay

## 2020-10-31 MED ORDER — HYDROCODONE-ACETAMINOPHEN 5-325 MG PO TABS: 1.0000 | ORAL_TABLET | ORAL | 0 refills | Status: DC | PRN

## 2020-10-31 MED ORDER — DOXAZOSIN MESYLATE ER 4 MG PO TB24
4.0000 mg | ORAL_TABLET | Freq: Every day | ORAL | 3 refills | Status: DC
Start: 2020-10-31 — End: 2020-11-01

## 2020-10-31 NOTE — Telephone Encounter (Signed)
Received a call from the patient requesting a refill on Cardura.  Chart reviewed.   Rx(s) sent to pharmacy electronically.  Patient notified and voiced understanding.

## 2020-10-31 NOTE — Telephone Encounter (Signed)
Received refill request for hydrocodone-acetaminophen 5-325 MG. Last OV was on 07/28/20 with Amy, NP.  Next OV is scheduled for 01/24/21 with D. Sater.  Last RX was written on 09/28/20 for 45 tabs.   [place] [facility] Database has been reviewed.

## 2020-10-31 NOTE — Addendum Note (Signed)
Addended by: Melanee Spry on: 10/31/2020 07:11 AM   Modules accepted: Orders

## 2020-10-31 NOTE — Telephone Encounter (Signed)
MD already e-scribed rx to pharmacy at 8:58am this morning.

## 2020-10-31 NOTE — Telephone Encounter (Signed)
Pt is needing a refill on his HYDROcodone-acetaminophen (NORCO/VICODIN) 5-325 MG tablet [facility] [address]

## 2020-11-01 ENCOUNTER — Telehealth: Payer: Self-pay | Admitting: Cardiovascular Disease

## 2020-11-01 MED ORDER — DOXAZOSIN MESYLATE 2 MG PO TABS: 4.0000 mg | ORAL_TABLET | Freq: Every day | ORAL | 3 refills | Status: DC

## 2020-11-01 NOTE — Telephone Encounter (Signed)
Pt c/o medication issue:  1. Name of Medication:  doxazosin (CARDURA XL) 4 MG 24 hr tablet  2. How are you currently taking this medication (dosage and times per day)?  As prescribed  3. Are you having a reaction (difficulty breathing--STAT)?  No   4. What is your medication issue?   [facility], but they informed him that it will cost him $85/month unless he gets the generic. He states he is completely out of medication and unsure of what to do. Please advise.

## 2020-11-01 NOTE — Telephone Encounter (Signed)
Spoke with [facility] who advised that Cardura XL (4 mg) does not come in a generic. I advised that we will resend Rx for doxazosin 2 mg tablets - take 2 tablets once daily. Trinna Post states they will get the Rx ready and will call the patient. I thanked him for his assistance.

## 2020-11-28 ENCOUNTER — Other Ambulatory Visit: Payer: Self-pay

## 2020-11-28 MED ORDER — LAMOTRIGINE 150 MG PO TABS: 150.0000 mg | ORAL_TABLET | Freq: Two times a day (BID) | ORAL | 0 refills | Status: DC

## 2020-11-30 ENCOUNTER — Other Ambulatory Visit: Payer: Self-pay | Admitting: Family Medicine

## 2020-11-30 MED ORDER — HYDROCODONE-ACETAMINOPHEN 5-325 MG PO TABS: 1.0000 | ORAL_TABLET | ORAL | 0 refills | Status: DC | PRN

## 2020-11-30 NOTE — Telephone Encounter (Signed)
Pt has called for a refill on his HYDROcodone-acetaminophen (NORCO/VICODIN) 5-325 MG tablet [phone]

## 2020-11-30 NOTE — Telephone Encounter (Signed)
I have routed this request to Dr Sater for review. The pt is due for the medication and [place] registry was verified.  

## 2020-12-29 ENCOUNTER — Other Ambulatory Visit: Payer: Self-pay | Admitting: Neurology

## 2020-12-29 ENCOUNTER — Telehealth: Payer: Self-pay | Admitting: Family Medicine

## 2020-12-29 MED ORDER — HYDROCODONE-ACETAMINOPHEN 5-325 MG PO TABS: 1.0000 | ORAL_TABLET | ORAL | 0 refills | Status: DC | PRN

## 2020-12-29 NOTE — Telephone Encounter (Signed)
Pt called needing a refill request for his HYDROcodone-acetaminophen (NORCO/VICODIN) 5-325 MG tablet [facility] [address]

## 2020-12-29 NOTE — Telephone Encounter (Signed)
I have routed this request to Dr Sater for review. The pt is due for the medication and [place] registry was verified.  

## 2021-01-24 ENCOUNTER — Ambulatory Visit: Payer: No Typology Code available for payment source | Admitting: Neurology

## 2021-01-24 ENCOUNTER — Other Ambulatory Visit: Payer: Self-pay

## 2021-01-24 ENCOUNTER — Encounter: Payer: Self-pay | Admitting: Neurology

## 2021-01-24 VITALS — BP 115/71 | HR 60 | Ht 70.0 in | Wt 219.5 lb

## 2021-01-24 DIAGNOSIS — G629 Polyneuropathy, unspecified: Secondary | ICD-10-CM | POA: Diagnosis not present

## 2021-01-24 DIAGNOSIS — M5417 Radiculopathy, lumbosacral region: Secondary | ICD-10-CM

## 2021-01-24 DIAGNOSIS — G894 Chronic pain syndrome: Secondary | ICD-10-CM

## 2021-01-24 DIAGNOSIS — N529 Male erectile dysfunction, unspecified: Secondary | ICD-10-CM

## 2021-01-24 DIAGNOSIS — R208 Other disturbances of skin sensation: Secondary | ICD-10-CM

## 2021-01-24 MED ORDER — GABAPENTIN 300 MG PO CAPS: ORAL_CAPSULE | ORAL | 1 refills | Status: DC

## 2021-01-24 MED ORDER — HYDROCODONE-ACETAMINOPHEN 5-325 MG PO TABS: 1.0000 | ORAL_TABLET | ORAL | 0 refills | Status: DC | PRN

## 2021-01-24 MED ORDER — LAMOTRIGINE 150 MG PO TABS: 150.0000 mg | ORAL_TABLET | Freq: Two times a day (BID) | ORAL | 0 refills | Status: DC

## 2021-01-24 NOTE — Progress Notes (Signed)
[facility]  PATIENT: Jeffrey Good DOB: November 12, 1950  REFERRING DOCTOR OR PCP:  Donald Prose SOURCE: patient, records from Dr. Nancy Fetter  _________________________________   HISTORICAL  CHIEF COMPLAINT:  Chief Complaint  Patient presents with   Follow-up    RM 1, alone. Last seen 07/28/20. Feet still get hot/intermittent pain in one toe on right foot.     HISTORY OF PRESENT ILLNESS:  Jeffrey Good is a 70 y.o. man with burning dysesthesias and back pain.      Update 01/24/2021: He feels the polyneuropathy is stable.   He reports a burning sensation that is usually mild in toes, right > left and often more noticeable if he lays down.    Lamotrigine 150 mg and gabapentin have helped the pain.  He will take a hydrocodone 1-2 a day if pain is more severe.  The l pain is fairly symmetric.  He denies weakness.Marland Kitchen      He does not note bladder changes but has ED, helped by Viagra.     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    Hydrocodone has helped the neuropathic pain as well as the back pain.     The PDMP was reviewed and he is compliant.  We will renew.  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      History of dysesthesia:     Early 2017, he had the onset of a burning sensation in his feet.  He did not note any change in the color of most of the toes though the tip of the big toe did seem to be a mildly different color. Sometimes, he gets a sensation of swelling in the big toes. Initially, he was tried on Lyrica but had stomach upset and stopped. Then, he was tried on gabapentin. 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 He does not think he was on either of these medications long enough to know where the not he got a benefit.  Lamotrigine combined with hydrocodone has helped best..        Studies :    ESR,  cryoglobulins, SPEP/IEF and ANA were normal.    An NCV/EMG was more consistent with mild S1 chronic radiculopathy. There was not evidence of a large fiber polyneuropathy, though a small fiber polyneuropathy is still possible.   REVIEW OF SYSTEMS: Constitutional: No fevers, chills, sweats, or change in appetite Eyes: No visual changes, double vision, eye pain Ear, nose and throat: No hearing loss, ear pain, nasal congestion, sore throat Cardiovascular: No chest pain, palpitations Respiratory:  No shortness of breath at rest or with exertion.   No wheezes GastrointestinaI: No nausea, vomiting, diarrhea, abdominal pain, fecal incontinence Genitourinary:  No dysuria, urinary retention or frequency.  No nocturia.  He has mild ED. Musculoskeletal:  No neck pain, back pain Integumentary: No rash, pruritus, skin lesions Neurological: as above Psychiatric: No depression at this time.  No anxiety Endocrine: No palpitations, diaphoresis, change in appetite, change in weigh or increased thirst Hematologic/Lymphatic:  No anemia, purpura, petechiae.   ALLERGIES: Allergies  Allergen Reactions   Pregabalin Nausea Only    HOME MEDICATIONS:  Current Outpatient Medications:    carvedilol (COREG) 25 MG tablet, TAKE 1 TABLET BY MOUTH TWICE A DAY, KEEP UPCOMMING APT, Disp: 180 tablet, Rfl: 2   Cetirizine HCl  10 MG CAPS, Take 10 mg by mouth daily. , Disp: , Rfl:    doxazosin (CARDURA) 2 MG tablet, Take 2 tablets (4 mg total) by mouth daily., Disp: 180 tablet, Rfl: 3   lisinopril (ZESTRIL) 20 MG tablet, TAKE 1 TABLET BY MOUTH TWICE A DAY, Disp: 60 tablet, Rfl: 11   meloxicam (MOBIC) 7.5 MG tablet, Take 1 tablet (7.5 mg total) by mouth daily., Disp: 30 tablet, Rfl: 11   sildenafil (VIAGRA) 100 MG tablet, One half to one po prn before intercourse, Disp: 30 tablet, Rfl: 3   spironolactone (ALDACTONE) 25 MG tablet, Take 1 tablet (25 mg total) by mouth daily., Disp: 30 tablet, Rfl: 9   gabapentin (NEURONTIN)  300 MG capsule, TAKE 1 CAPSULE BY MOUTH THREE TIMES A DAY, Disp: 270 capsule, Rfl: 1   HYDROcodone-acetaminophen (NORCO/VICODIN) 5-325 MG tablet, Take 1-2 tablets by mouth as needed for moderate pain., Disp: 45 tablet, Rfl: 0   lamoTRIgine (LAMICTAL) 150 MG tablet, Take 1 tablet (150 mg total) by mouth 2 (two) times daily., Disp: 180 tablet, Rfl: 0  PAST MEDICAL HISTORY: Past Medical History:  Diagnosis Date   Cataract    Mixed form OU   Erectile dysfunction    Hyperlipidemia    Hypertension    Hypertensive retinopathy    OU   Seasonal allergies     PAST SURGICAL HISTORY: Past Surgical History:  Procedure Laterality Date   IR RADIOLOGIST EVAL & MGMT  07/16/2019   IR RADIOLOGIST EVAL & MGMT  07/29/2019   KNEE ARTHROSCOPY     1989 left   LAPAROSCOPIC APPENDECTOMY N/A 10/15/2019   Procedure: LAPAROSCOPIC APPENDECTOMY;  Surgeon: Erroll Luna, MD;  Location: [facility];  Service: General;  Laterality: N/A;    FAMILY HISTORY: Family History  Problem Relation Age of Onset   Hypertension Mother    Diabetes Mother     SOCIAL HISTORY:  Social History   Socioeconomic History   Marital status: Married    Spouse name: Not on file   Number of children: Not on file   Years of education: Not on file   Highest education level: Not on file  Occupational History   Not on file  Tobacco Use   Smoking status: Never   Smokeless tobacco: Never  Vaping Use   Vaping Use: Never used  Substance and Sexual Activity   Alcohol use: Yes    Alcohol/week: 1.0 standard drink    Types: 1 Cans of beer per week    Comment: very rare   Drug use: No   Sexual activity: Yes  Other Topics Concern   Not on file  Social History Narrative   Not on file   Social Determinants of Health   Financial Resource Strain: Not on file  Food Insecurity: Not on file  Transportation Needs: Not on file  Physical Activity: Not on file  Stress: Not on file  Social Connections: Not on file  Intimate Partner  Violence: Not on file     PHYSICAL EXAM  Vitals:   01/24/21 1504  BP: 115/71  Pulse: 60  Weight: 219 lb 8 oz (99.6 kg)  Height: 5' 10"  (1.778 m)    Body mass index is 31.49 kg/m.   General: The patient is well-developed and well-nourished and in no acute distress  Back:   The back is nontender. Range of motion is slightly reduced.  Neurologic Exam  Mental status: The patient is alert and oriented x 3 at the time of the examination.  The patient has apparent normal recent and remote memory, with an apparently normal attention span and concentration ability.   Speech is normal.  Cranial nerves: Extraocular movements are full.  Facial strength was normal.  Hearing appeared to be normal and symmetric.  Motor:  Muscle bulk is normal.   Muscle tone is normal.  Strength was 5/5 in the arms and legs including the feet.  Sensory:  He has intact sensation to touch and temperature and vibration in the arms.  He had mild reduced vibration sense and pinprick sense at the toes compared to the ankles which were the same as the knees  Coordination: Cerebellar testing reveals good finger-nose-finger and heel-to-shin bilaterally.  Gait and station: Station is normal.  Gait is normal.. Romberg is  negative.   Reflexes: Deep tendon reflexes are symmetric and normal bilaterally.      DIAGNOSTIC DATA (LABS, IMAGING, TESTING) - I reviewed patient records, labs, notes, testing and imaging myself where available.  Lab Results  Component Value Date   WBC 5.1 10/15/2019   HGB 11.9 (L) 10/15/2019   HCT 38.6 (L) 10/15/2019   MCV 90.0 10/15/2019   PLT 158 10/15/2019      Component Value Date/Time   NA 139 10/15/2019 0629   K 4.2 10/15/2019 0629   CL 105 10/15/2019 0629   CO2 26 10/15/2019 0629   GLUCOSE 97 10/15/2019 0629   BUN 17 10/15/2019 0629   CREATININE 1.00 10/15/2019 0629   CREATININE 1.16 04/05/2016 1408   CALCIUM 9.2 10/15/2019 0629   PROT 7.3 10/15/2019 0629   PROT 7.3  11/16/2014 1635   ALBUMIN 3.6 10/15/2019 0629   AST 20 10/15/2019 0629   ALT 22 10/15/2019 0629   ALKPHOS 58 10/15/2019 0629   BILITOT 0.4 10/15/2019 0629   GFRNONAA >60 10/15/2019 0629   GFRAA >60 10/15/2019 0629   Lab Results  Component Value Date   CHOL 126 09/01/2014   HDL 34.00 (L) 09/01/2014   LDLCALC 71 09/01/2014   TRIG 103.0 09/01/2014   CHOLHDL 4 09/01/2014       ASSESSMENT AND PLAN  Polyneuropathy  Chronic pain syndrome  Lumbosacral radiculopathy at S1  Erectile dysfunction, unspecified erectile dysfunction type  Dysesthesia   1.   Continue lamotrigine 150 mg p.o. twice daily and gabapentin 300 mg 3 times a day for the neuropathic pain.   2.   Continue Hydrocodone 1-2 at bedtime for neuropathy and back pain.    45 pills needs to last 30 days.   [place] reviewed and he is compliant.   He has never had drug seeking behavior.   3.   Renew Viagra as needed for ED 3.   rtc 6 mos or sooner if  new or worsening symptoms   Amaar Oshita A. Felecia Shelling, MD, PhD 14/78/2956, 2:13 PM Certified in Neurology, Clinical Neurophysiology, Sleep Medicine, Pain Medicine and Neuroimaging  [facility] [address] [place], [place] [address] [phone] =

## 2021-01-25 ENCOUNTER — Other Ambulatory Visit: Payer: Self-pay | Admitting: Neurology

## 2021-01-31 ENCOUNTER — Telehealth: Payer: Self-pay | Admitting: Neurology

## 2021-01-31 NOTE — Telephone Encounter (Signed)
Refill already sent to pharmacy 01/24/21. Should be at pharmacy for him.

## 2021-01-31 NOTE — Telephone Encounter (Signed)
Pt called requesting refill for HYDROcodone-acetaminophen (NORCO/VICODIN) 5-325 MG tablet. [id number] [place], [place] - [address]

## 2021-02-23 ENCOUNTER — Encounter (INDEPENDENT_AMBULATORY_CARE_PROVIDER_SITE_OTHER): Payer: PRIVATE HEALTH INSURANCE | Admitting: Ophthalmology

## 2021-02-24 NOTE — Progress Notes (Incomplete)
[facility] - Clinic Note  03/07/2021     CHIEF COMPLAINT Patient presents for No chief complaint on file.   HISTORY OF PRESENT ILLNESS: Jeffrey Good is a 70 y.o. male who presents to the clinic today for:      Referring physician: Deatra James, MD [phone] [address] [place],  [place] [address]  HISTORICAL INFORMATION:   Selected notes from the MEDICAL RECORD NUMBER Referred by Dr. Conley Rolls for eval of floaters/ischemia/heme OU.   CURRENT MEDICATIONS: No current outpatient medications on file. (Ophthalmic Drugs)   No current facility-administered medications for this visit. (Ophthalmic Drugs)   Current Outpatient Medications (Other)  Medication Sig   carvedilol (COREG) 25 MG tablet TAKE 1 TABLET BY MOUTH TWICE A DAY, KEEP UPCOMMING APT   Cetirizine HCl 10 MG CAPS Take 10 mg by mouth daily.    doxazosin (CARDURA) 2 MG tablet Take 2 tablets (4 mg total) by mouth daily.   gabapentin (NEURONTIN) 300 MG capsule TAKE 1 CAPSULE BY MOUTH THREE TIMES A DAY   HYDROcodone-acetaminophen (NORCO/VICODIN) 5-325 MG tablet Take 1-2 tablets by mouth as needed for moderate pain.   lamoTRIgine (LAMICTAL) 150 MG tablet Take 1 tablet (150 mg total) by mouth 2 (two) times daily.   lisinopril (ZESTRIL) 20 MG tablet TAKE 1 TABLET BY MOUTH TWICE A DAY   meloxicam (MOBIC) 7.5 MG tablet TAKE 1 TABLET BY MOUTH EVERY DAY   sildenafil (VIAGRA) 100 MG tablet One half to one po prn before intercourse   spironolactone (ALDACTONE) 25 MG tablet Take 1 tablet (25 mg total) by mouth daily.   No current facility-administered medications for this visit. (Other)      REVIEW OF SYSTEMS:     ALLERGIES Allergies  Allergen Reactions   Pregabalin Nausea Only    PAST MEDICAL HISTORY Past Medical History:  Diagnosis Date   Cataract    Mixed form OU   Erectile dysfunction    Hyperlipidemia    Hypertension    Hypertensive retinopathy    OU   Seasonal allergies    Past  Surgical History:  Procedure Laterality Date   IR RADIOLOGIST EVAL & MGMT  07/16/2019   IR RADIOLOGIST EVAL & MGMT  07/29/2019   KNEE ARTHROSCOPY     1989 left   LAPAROSCOPIC APPENDECTOMY N/A 10/15/2019   Procedure: LAPAROSCOPIC APPENDECTOMY;  Surgeon: Harriette Bouillon, MD;  Location: [facility];  Service: General;  Laterality: N/A;    FAMILY HISTORY Family History  Problem Relation Age of Onset   Hypertension Mother    Diabetes Mother     SOCIAL HISTORY Social History   Tobacco Use   Smoking status: Never   Smokeless tobacco: Never  Vaping Use   Vaping Use: Never used  Substance Use Topics   Alcohol use: Yes    Alcohol/week: 1.0 standard drink    Types: 1 Cans of beer per week    Comment: very rare   Drug use: No         OPHTHALMIC EXAM:  Not recorded     IMAGING AND PROCEDURES  Imaging and Procedures for 03/07/2021            ASSESSMENT/PLAN:  No diagnosis found.   1. PVD / vitreous syneresis OD  - history of floaters OD, onset ~1st of Oct 2021 -- persistent, but improved from initial onset  - Discussed findings and prognosis  - No RT or RD on 360 peripheral exam  - Reviewed s/s  of RT/RD  - Strict return precautions for any such RT/RD signs/symptoms  2-4. Retinal Ischemia, remote BRVO w/ proliferative retinpathy OD  - exam shows sclerotic vessels in SN peripheral quad  - FA 10.19.21 shows telangectasias w/ late leakage, vascular perfusion defects in superonasal peripheral quad w/ early NV-- suggestive of remote BRVO   - optic disc with nasal corkscrew vessel  - s/p PRP OD (12.08.21) -- good laser changes in place  - repeat FA 5.13.22 shows interval improvement in MA and leakage SN peripheral quad  - f/u 6-9 months, DFE, OCT  5. No retinal edema on exam or OCT  6,7. Hypertensive retinopathy OU - discussed importance of tight BP control - monitor  8. Mixed Cataract OU - The symptoms of cataract, surgical options, and treatments and risks were  discussed with patient. - discussed diagnosis and progression - monitor   Ophthalmic Meds Ordered this visit:  No orders of the defined types were placed in this encounter.     No follow-ups on file.  There are no Patient Instructions on file for this visit.  This document serves as a record of services personally performed by Karie Chimera, MD, PhD. It was created on their behalf by Cristopher Estimable, COT an ophthalmic technician. The creation of this record is the provider's dictation and/or activities during the visit.    Electronically signed by: Cristopher Estimable, [place] 11.18.22 @ 10:52 AM   Karie Chimera, M.D., Ph.D. Diseases & Surgery of the Retina and Vitreous [facility]   Abbreviations: M myopia (nearsighted); A astigmatism; H hyperopia (farsighted); P presbyopia; Mrx spectacle prescription;  CTL contact lenses; OD right eye; OS left eye; OU both eyes  XT exotropia; ET esotropia; PEK punctate epithelial keratitis; PEE punctate epithelial erosions; DES dry eye syndrome; MGD meibomian gland dysfunction; ATs artificial tears; PFAT's preservative free artificial tears; NSC nuclear sclerotic cataract; PSC posterior subcapsular cataract; ERM epi-retinal membrane; PVD posterior vitreous detachment; RD retinal detachment; DM diabetes mellitus; DR diabetic retinopathy; NPDR non-proliferative diabetic retinopathy; PDR proliferative diabetic retinopathy; CSME clinically significant macular edema; DME diabetic macular edema; dbh dot blot hemorrhages; CWS cotton wool spot; POAG primary open angle glaucoma; C/D cup-to-disc ratio; HVF humphrey visual field; GVF goldmann visual field; OCT optical coherence tomography; IOP intraocular pressure; BRVO Branch retinal vein occlusion; CRVO central retinal vein occlusion; CRAO central retinal artery occlusion; BRAO branch retinal artery occlusion; RT retinal tear; SB scleral buckle; PPV pars plana vitrectomy; VH Vitreous hemorrhage; PRP  panretinal laser photocoagulation; IVK intravitreal kenalog; VMT vitreomacular traction; MH Macular hole;  NVD neovascularization of the disc; NVE neovascularization elsewhere; AREDS age related eye disease study; ARMD age related macular degeneration; POAG primary open angle glaucoma; EBMD epithelial/anterior basement membrane dystrophy; ACIOL anterior chamber intraocular lens; IOL intraocular lens; PCIOL posterior chamber intraocular lens; Phaco/IOL phacoemulsification with intraocular lens placement; PRK photorefractive keratectomy; LASIK laser assisted in situ keratomileusis; HTN hypertension; DM diabetes mellitus; COPD chronic obstructive pulmonary disease

## 2021-02-28 ENCOUNTER — Other Ambulatory Visit: Payer: Self-pay | Admitting: Neurology

## 2021-02-28 MED ORDER — HYDROCODONE-ACETAMINOPHEN 5-325 MG PO TABS: 1.0000 | ORAL_TABLET | ORAL | 0 refills | Status: DC | PRN

## 2021-02-28 NOTE — Telephone Encounter (Signed)
I have routed this request to Dr Sater for review. The pt is due for the medication and [place] registry was verified.  

## 2021-02-28 NOTE — Telephone Encounter (Signed)
Pt request refill for HYDROcodone-acetaminophen (NORCO/VICODIN) 5-[facility] MG tablet at [id number]  

## 2021-03-07 ENCOUNTER — Encounter (INDEPENDENT_AMBULATORY_CARE_PROVIDER_SITE_OTHER): Payer: Self-pay | Admitting: Ophthalmology

## 2021-03-07 ENCOUNTER — Other Ambulatory Visit: Payer: Self-pay

## 2021-03-07 ENCOUNTER — Ambulatory Visit (INDEPENDENT_AMBULATORY_CARE_PROVIDER_SITE_OTHER): Payer: No Typology Code available for payment source | Admitting: Ophthalmology

## 2021-03-07 DIAGNOSIS — H43811 Vitreous degeneration, right eye: Secondary | ICD-10-CM | POA: Diagnosis not present

## 2021-03-07 DIAGNOSIS — H35033 Hypertensive retinopathy, bilateral: Secondary | ICD-10-CM

## 2021-03-07 DIAGNOSIS — I10 Essential (primary) hypertension: Secondary | ICD-10-CM

## 2021-03-07 DIAGNOSIS — H3581 Retinal edema: Secondary | ICD-10-CM

## 2021-03-07 DIAGNOSIS — H3521 Other non-diabetic proliferative retinopathy, right eye: Secondary | ICD-10-CM | POA: Diagnosis not present

## 2021-03-07 DIAGNOSIS — H25813 Combined forms of age-related cataract, bilateral: Secondary | ICD-10-CM

## 2021-03-07 DIAGNOSIS — H3582 Retinal ischemia: Secondary | ICD-10-CM | POA: Diagnosis not present

## 2021-03-07 DIAGNOSIS — H348312 Tributary (branch) retinal vein occlusion, right eye, stable: Secondary | ICD-10-CM | POA: Diagnosis not present

## 2021-03-07 NOTE — Progress Notes (Signed)
[facility] - Clinic Note  03/07/2021     CHIEF COMPLAINT Patient presents for Retina Follow Up   HISTORY OF PRESENT ILLNESS: Jeffrey Good is a 70 y.o. male who presents to the clinic today for:   HPI     Retina Follow Up   Patient presents with  CRVO/BRVO.  In right eye.  Severity is moderate.  Duration of 6 months.  Since onset it is stable.  I, the attending physician,  performed the HPI with the patient and updated documentation appropriately.        Comments   Patient states vision the same OU.      Last edited by Rennis Chris, MD on 03/07/2021  5:31 PM.    Pt states he still has a "little shadow" in his right eye vision, no new health concerns  Referring physician: Deatra James, MD [phone] [address] [place],  [place] [address]  HISTORICAL INFORMATION:   Selected notes from the MEDICAL RECORD NUMBER Referred by Dr. Conley Rolls for eval of floaters/ischemia/heme OU.   CURRENT MEDICATIONS: No current outpatient medications on file. (Ophthalmic Drugs)   No current facility-administered medications for this visit. (Ophthalmic Drugs)   Current Outpatient Medications (Other)  Medication Sig   carvedilol (COREG) 25 MG tablet TAKE 1 TABLET BY MOUTH TWICE A DAY, KEEP UPCOMMING APT   Cetirizine HCl 10 MG CAPS Take 10 mg by mouth daily.    doxazosin (CARDURA) 2 MG tablet Take 2 tablets (4 mg total) by mouth daily.   gabapentin (NEURONTIN) 300 MG capsule TAKE 1 CAPSULE BY MOUTH THREE TIMES A DAY   HYDROcodone-acetaminophen (NORCO/VICODIN) 5-325 MG tablet Take 1-2 tablets by mouth as needed for moderate pain.   lamoTRIgine (LAMICTAL) 150 MG tablet Take 1 tablet (150 mg total) by mouth 2 (two) times daily.   lisinopril (ZESTRIL) 20 MG tablet TAKE 1 TABLET BY MOUTH TWICE A DAY   meloxicam (MOBIC) 7.5 MG tablet TAKE 1 TABLET BY MOUTH EVERY DAY   sildenafil (VIAGRA) 100 MG tablet One half to one po prn before intercourse   spironolactone (ALDACTONE) 25  MG tablet Take 1 tablet (25 mg total) by mouth daily.   No current facility-administered medications for this visit. (Other)   REVIEW OF SYSTEMS: ROS   Positive for: Musculoskeletal, Eyes, Allergic/Imm Negative for: Constitutional, Gastrointestinal, Neurological, Skin, Genitourinary, HENT, Endocrine, Cardiovascular, Respiratory, Psychiatric, Heme/Lymph Last edited by Annalee Genta D, COT on 03/07/2021  2:50 PM.     ALLERGIES Allergies  Allergen Reactions   Pregabalin Nausea Only   PAST MEDICAL HISTORY Past Medical History:  Diagnosis Date   Cataract    Mixed form OU   Erectile dysfunction    Hyperlipidemia    Hypertension    Hypertensive retinopathy    OU   Seasonal allergies    Past Surgical History:  Procedure Laterality Date   IR RADIOLOGIST EVAL & MGMT  07/16/2019   IR RADIOLOGIST EVAL & MGMT  07/29/2019   KNEE ARTHROSCOPY     1989 left   LAPAROSCOPIC APPENDECTOMY N/A 10/15/2019   Procedure: LAPAROSCOPIC APPENDECTOMY;  Surgeon: Harriette Bouillon, MD;  Location: [facility] OR;  Service: General;  Laterality: N/A;   FAMILY HISTORY Family History  Problem Relation Age of Onset   Hypertension Mother    Diabetes Mother    SOCIAL HISTORY Social History   Tobacco Use   Smoking status: Never   Smokeless tobacco: Never  Vaping Use   Vaping Use: Never used  Substance Use Topics   Alcohol use: Yes    Alcohol/week: 1.0 standard drink    Types: 1 Cans of beer per week    Comment: very rare   Drug use: No       OPHTHALMIC EXAM:  Base Eye Exam     Visual Acuity (Snellen - Linear)       Right Left   Dist [place] 20/20 -1 20/20         Tonometry (Tonopen, 2:55 PM)       Right Left   Pressure 21 23         Tonometry #2 (Tonopen, 3:15 PM)       Right Left   Pressure 14 12         Pupils       Dark Light Shape React APD   Right 3 2 Round Brisk None   Left 3 2 Round Brisk None         Visual Fields (Counting fingers)       Left Right    Full Full          Extraocular Movement       Right Left    Full, Ortho Full, Ortho         Neuro/Psych     Oriented x3: Yes   Mood/Affect: Normal         Dilation     Both eyes: 1.0% Mydriacyl, 2.5% Phenylephrine @ 2:55 PM           Slit Lamp and Fundus Exam     Slit Lamp Exam       Right Left   Lids/Lashes Dermatochalasis - upper lid Dermatochalasis - upper lid   Conjunctiva/Sclera mild Melanosis nasal and temporal pinguecula, mild melanosis   Cornea arcus, trace PEE arcus, mild Debris in tear film, trace Punctate epithelial erosions   Anterior Chamber Deep and quiet Deep and quiet   Iris Round and moderately dilated Round and moderately dilated   Lens 2+ Nuclear sclerosis, 2+ Cortical cataract 2+ Nuclear sclerosis, 2+ Cortical cataract   Anterior Vitreous Vitreous syneresis, Posterior vitreous detachment, vitreous condensations Vitreous syneresis         Fundus Exam       Right Left   Disc Pink and Sharp, Compact, corkscrew vessel nasally, +PPP Pink and Sharp, Compact, focal temporal PPP   C/D Ratio 0.2 0.2   Macula Flat, Good foveal reflex, mild RPE mottling, No heme or edema Flat, Good foveal reflex, mild RPE mottling, No heme or edema   Vessels attenuated, Tortuous, mild Copper wiring, mild sheathing of SN arterioles attenuated, Tortuous, mild Copper wiring   Periphery Attached, peripheral drusen, sclerotic vessels superonasal periphery -- good segmental PRP laser changes; +focal blot hemes and punctate exudates SN quad - improved / essentially resolved, No RT/RD Attached, focal punctate CR atrophy superior to disc            IMAGING AND PROCEDURES  Imaging and Procedures for 03/07/2021  OCT, Retina - OU - Both Eyes       Right Eye Quality was good. Central Foveal Thickness: 279. Progression has been stable. Findings include normal foveal contour, no IRF, no SRF.   Left Eye Quality was good. Central Foveal Thickness: 274. Progression has been stable. Findings  include normal foveal contour, no IRF, no SRF, vitreomacular adhesion .   Notes *Images captured and stored on drive  Diagnosis / Impression:  NFP, no IRF/SRF OU  Clinical management:  See  below  Abbreviations: NFP - Normal foveal profile. CME - cystoid macular edema. PED - pigment epithelial detachment. IRF - intraretinal fluid. SRF - subretinal fluid. EZ - ellipsoid zone. ERM - epiretinal membrane. ORA - outer retinal atrophy. ORT - outer retinal tubulation. SRHM - subretinal hyper-reflective material. IRHM - intraretinal hyper-reflective material            ASSESSMENT/PLAN:   ICD-10-CM   1. 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 Retinal ischemia  H35.82     3. Proliferative retinopathy of right eye  H35.21     4. Retinal edema  H35.81 OCT, Retina - OU - Both Eyes    5. Posterior vitreous detachment of right eye  H43.811     6. Essential hypertension  I10     7. Hypertensive retinopathy of both eyes  H35.033     8. Combined forms of age-related cataract of both eyes  H25.813       1-4. Retinal Ischemia, remote BRVO w/ proliferative retinopathy OD  - exam shows sclerotic vessels in SN peripheral quad  - FA 10.19.21 shows telangectasias w/ late leakage, vascular perfusion defects in superonasal peripheral quad w/ early NV-- suggestive of remote BRVO   - optic disc with nasal corkscrew vessel  - s/p segmental PRP OD (12.08.21) to areas of sclerotic vessels SN periphery -- good laser changes in place  - repeat FA 5.13.22 shows interval improvement in MA and leakage SN peripheral quad  - f/u 6-9 months, DFE, OCT  5. PVD / vitreous syneresis OD  - history of floaters OD, onset ~1st of Oct 2021 -- improved  - Discussed findings and prognosis  - No RT or RD on 360 peripheral exam  - Reviewed s/s of RT/RD  - Strict return precautions for any such RT/RD signs/symptoms  6,7. Hypertensive retinopathy OU - discussed importance of tight BP  control - monitor  8. Mixed Cataract OU - The symptoms of cataract, surgical options, and treatments and risks were discussed with patient. - discussed diagnosis and progression - monitor   Ophthalmic Meds Ordered this visit:  No orders of the defined types were placed in this encounter.    Return for f/u 6-9 months, retinal ischemia OD, DFE, OCT.  There are no Patient Instructions on file for this visit.  This document serves as a record of services personally performed by Karie Chimera, MD, PhD. It was created on their behalf by Cristopher Estimable, COT an ophthalmic technician. The creation of this record is the provider's dictation and/or activities during the visit.    Electronically signed by: Cristopher Estimable, [place] 11.18.22 @ 5:37 PM   This document serves as a record of services personally performed by Karie Chimera, MD, PhD. It was created on their behalf by Glee Arvin. Manson Passey, OA an ophthalmic technician. The creation of this record is the provider's dictation and/or activities during the visit.    Electronically signed by: Glee Arvin. Kristopher Oppenheim 11.29.2022 5:37 PM   Karie Chimera, M.D., Ph.D. Diseases & Surgery of the Retina and Vitreous [facility]  I have reviewed the above documentation for accuracy and completeness, and I agree with the above. Karie Chimera, M.D., Ph.D. 03/07/21 5:37 PM   Abbreviations: M myopia (nearsighted); A astigmatism; H hyperopia (farsighted); P presbyopia; Mrx spectacle prescription;  CTL contact lenses; OD right eye; OS left eye; OU both eyes  XT exotropia; ET esotropia; PEK  punctate epithelial keratitis; PEE punctate epithelial erosions; DES dry eye syndrome; MGD meibomian gland dysfunction; ATs artificial tears; PFAT's preservative free artificial tears; NSC nuclear sclerotic cataract; PSC posterior subcapsular cataract; ERM epi-retinal membrane; PVD posterior vitreous detachment; RD retinal detachment; DM diabetes mellitus;  DR diabetic retinopathy; NPDR non-proliferative diabetic retinopathy; PDR proliferative diabetic retinopathy; CSME clinically significant macular edema; DME diabetic macular edema; dbh dot blot hemorrhages; CWS cotton wool spot; POAG primary open angle glaucoma; C/D cup-to-disc ratio; HVF humphrey visual field; GVF goldmann visual field; OCT optical coherence tomography; IOP intraocular pressure; BRVO Branch retinal vein occlusion; CRVO central retinal vein occlusion; CRAO central retinal artery occlusion; BRAO branch retinal artery occlusion; RT retinal tear; SB scleral buckle; PPV pars plana vitrectomy; VH Vitreous hemorrhage; PRP panretinal laser photocoagulation; IVK intravitreal kenalog; VMT vitreomacular traction; MH Macular hole;  NVD neovascularization of the disc; NVE neovascularization elsewhere; AREDS age related eye disease study; ARMD age related macular degeneration; POAG primary open angle glaucoma; EBMD epithelial/anterior basement membrane dystrophy; ACIOL anterior chamber intraocular lens; IOL intraocular lens; PCIOL posterior chamber intraocular lens; Phaco/IOL phacoemulsification with intraocular lens placement; PRK photorefractive keratectomy; LASIK laser assisted in situ keratomileusis; HTN hypertension; DM diabetes mellitus; COPD chronic obstructive pulmonary disease

## 2021-04-04 ENCOUNTER — Other Ambulatory Visit: Payer: Self-pay | Admitting: Neurology

## 2021-04-05 ENCOUNTER — Other Ambulatory Visit: Payer: Self-pay | Admitting: Neurology

## 2021-04-05 MED ORDER — HYDROCODONE-ACETAMINOPHEN 5-325 MG PO TABS: 1.0000 | ORAL_TABLET | ORAL | 0 refills | Status: DC | PRN

## 2021-04-05 NOTE — Telephone Encounter (Signed)
Pt requesting refill for HYDROcodone-acetaminophen (NORCO/VICODIN) 5-325 MG tablet. Pharmacy [id number] - [place], [place] - [address]

## 2021-04-05 NOTE — Telephone Encounter (Signed)
I have routed this request to Dr Sater for review. The pt is due for the medication and [place] registry was verified.  

## 2021-05-05 ENCOUNTER — Other Ambulatory Visit: Payer: Self-pay | Admitting: Neurology

## 2021-05-05 NOTE — Telephone Encounter (Signed)
Pt is needing a refill request for his HYDROcodone-acetaminophen (NORCO/VICODIN) 5-325 MG tablet [facility] [address]

## 2021-05-08 ENCOUNTER — Other Ambulatory Visit: Payer: Self-pay | Admitting: Cardiovascular Disease

## 2021-05-08 ENCOUNTER — Other Ambulatory Visit: Payer: Self-pay | Admitting: Neurology

## 2021-05-08 MED ORDER — HYDROCODONE-ACETAMINOPHEN 5-325 MG PO TABS: 1.0000 | ORAL_TABLET | ORAL | 0 refills | Status: DC | PRN

## 2021-05-08 NOTE — Telephone Encounter (Signed)
Pt request refill for HYDROcodone-acetaminophen (NORCO/VICODIN) 5-[facility] MG tablet at [id number]  

## 2021-05-08 NOTE — Telephone Encounter (Signed)
Dr. Felecia Shelling has forwarded this refill request already into the pharmacy for the pt

## 2021-05-09 ENCOUNTER — Other Ambulatory Visit: Payer: Self-pay

## 2021-05-09 MED ORDER — HYDROCODONE-ACETAMINOPHEN 5-325 MG PO TABS: 1.0000 | ORAL_TABLET | ORAL | 0 refills | Status: DC | PRN

## 2021-05-09 NOTE — Telephone Encounter (Signed)
[facility] He would like it resent to the [facility] on [address]

## 2021-05-09 NOTE — Progress Notes (Unsigned)
Received refill request for hydrocodone- acet..  Last OV was on 01/24/21.  Next OV is scheduled for 07/25/21 .  Last RX was written on 04/05/21 for 45 tabs.   [place] [facility] Database has been reviewed.

## 2021-05-09 NOTE — Addendum Note (Signed)
Addended by: Judi Cong on: 05/09/2021 12:10 PM   Modules accepted: Orders

## 2021-05-09 NOTE — Telephone Encounter (Signed)
I will forward the refill request to Dr Felecia Shelling to send to [facility] on [address].

## 2021-05-10 ENCOUNTER — Other Ambulatory Visit: Payer: Self-pay | Admitting: Neurology

## 2021-05-10 NOTE — Telephone Encounter (Signed)
Rx already sent to pharmacy yesterday.   Resent today w note from pharmacy," Please clarify the directions  for this prescription. frequency of administration is needed."

## 2021-05-11 ENCOUNTER — Telehealth: Payer: Self-pay | Admitting: *Deleted

## 2021-05-11 MED ORDER — HYDROCODONE-ACETAMINOPHEN 5-300 MG PO TABS: ORAL_TABLET | ORAL | 0 refills | Status: DC

## 2021-05-11 NOTE — Telephone Encounter (Signed)
[facility] that [facility]one 5-325mg  backordered/unavailable.  [facility] at [phone] Spoke w/ tech. All [facility]'s backordered on above dose of hydrocodone. They have some 10-325mg  left.  They have Vicodin 5-300mg  on file, no 5-500mg  available.

## 2021-06-06 ENCOUNTER — Other Ambulatory Visit: Payer: Self-pay | Admitting: Neurology

## 2021-06-06 MED ORDER — HYDROCODONE-ACETAMINOPHEN 5-300 MG PO TABS: ORAL_TABLET | ORAL | 0 refills | Status: DC

## 2021-06-06 NOTE — Telephone Encounter (Signed)
Pt request refill for HYDROcodone-acetaminophen (NORCO/VICODIN) 5-[facility] MG tablet at [id number]  

## 2021-07-10 ENCOUNTER — Other Ambulatory Visit: Payer: Self-pay | Admitting: Neurology

## 2021-07-10 MED ORDER — HYDROCODONE-ACETAMINOPHEN 5-300 MG PO TABS: ORAL_TABLET | ORAL | 0 refills | Status: DC

## 2021-07-10 NOTE — Telephone Encounter (Signed)
Error

## 2021-07-10 NOTE — Telephone Encounter (Incomplete Revision)
Pt is requesting a refill for HYDROcodone-acetaminophen (NORCO/VICODIN) 5-325 MG tablet  .  Pharmacy: [id number]   

## 2021-07-10 NOTE — Telephone Encounter (Addendum)
Error

## 2021-07-14 ENCOUNTER — Telehealth: Payer: Self-pay | Admitting: Neurology

## 2021-07-14 NOTE — Telephone Encounter (Signed)
Error

## 2021-07-22 ENCOUNTER — Other Ambulatory Visit: Payer: Self-pay | Admitting: Cardiovascular Disease

## 2021-07-25 ENCOUNTER — Ambulatory Visit: Payer: PRIVATE HEALTH INSURANCE | Admitting: Family Medicine

## 2021-07-25 ENCOUNTER — Encounter: Payer: Self-pay | Admitting: Family Medicine

## 2021-07-25 VITALS — BP 122/71 | HR 70 | Ht 70.0 in | Wt 200.0 lb

## 2021-07-25 DIAGNOSIS — N529 Male erectile dysfunction, unspecified: Secondary | ICD-10-CM | POA: Diagnosis not present

## 2021-07-25 DIAGNOSIS — G629 Polyneuropathy, unspecified: Secondary | ICD-10-CM

## 2021-07-25 DIAGNOSIS — R208 Other disturbances of skin sensation: Secondary | ICD-10-CM

## 2021-07-25 DIAGNOSIS — G894 Chronic pain syndrome: Secondary | ICD-10-CM | POA: Diagnosis not present

## 2021-07-25 MED ORDER — LAMOTRIGINE 150 MG PO TABS: 150.0000 mg | ORAL_TABLET | Freq: Two times a day (BID) | ORAL | 1 refills | Status: DC

## 2021-07-25 MED ORDER — GABAPENTIN 300 MG PO CAPS: ORAL_CAPSULE | ORAL | 1 refills | Status: DC

## 2021-07-25 MED ORDER — MELOXICAM 7.5 MG PO TABS: 7.5000 mg | ORAL_TABLET | Freq: Every day | ORAL | 1 refills | Status: DC

## 2021-07-25 NOTE — Progress Notes (Signed)
? ? ?PATIENT: Jeffrey Good ?DOB: January 27, 1951 ? ?REASON FOR VISIT: follow up ?HISTORY FROM: patient ? ?Chief Complaint  ?Patient presents with  ? Follow-up  ?  Pt alone, rm 16. Pt states overall stable. Doing well. No issues  ?  ? ?HISTORY OF PRESENT ILLNESS: ? ?07/25/21 ALL: ?Jeffrey Good returns for follow up. He is doing well. Symptoms are stable. His feet feel ot at times but manageable. He continues lamotrigine, gabapentin and meloxicam as prescribed. Nudrocodone 1-1.5 tablets daily as needed. He reports he usually uses this when he is working in the yard. PDMP shows regular, time appropriate refills. He continues viagra as needed for ED. He has been out of work for the past three months due to his wife being ill. She is a quadriplegic and had skin ulcer.  ? ?01/21/2020 ALL: ?He returns for follow up. He reports neuropathy pain is stable. He feels that his feet feel hot from time to time but it isn't too bad on medication. He is taking lamotrigine, gabapentin and hydrocodone as prescribed. Meloxicam helps with back pain. He feels that meds work better together. 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 He denies sedative side effects. He takes hydrocodone at night only. Viagra PRN for ED. No adverse effects noted.  ? ?01/21/2019 RS: ?He feels the polyneuropathy is stable.   He gets a burning sensation at times but not as bad as before the medication.     Lamotrigine 150 mg helped incompletely and he has done better with the addition of gabapentin.    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  The leg pain is fairly symmetric.   He has no weakness.   Pain is worse at night and when he first wakes up and sometimes after a long day of driving.Marland Kitchen  He sometimes needs a hydrocodone when pain is bothering him more.     ?  ?He denies weakness in his legs.    He does not note bladder changes but has ED, helped by Viagra.     His back pain is only troublesome after he gets out of bed until he moves around and  after a longer truck ride.    Hydrocodone has helped the neuropathic pain as well as the back pain.     The PDMP was reviewed and he is compliant.   ?  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    ?  ?  ?History of dysesthesia:     Early 2017, he had the onset of a burning sensation in his feet.  He did not note any change in the color of most of the toes though the tip of the big toe did seem to be a mildly different color. Sometimes, he gets a sensation of swelling in the big toes. Initially, he was tried on Lyrica but had stomach upset and stopped. Then, he was tried on gabapentin. 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 He does not think he was on either of these medications long enough to know where the not he got a benefit.  Lamotrigine combined with hydrocodone has helped best..       ?  ?Studies :    ESR, cryoglobulins, SPEP/IEF and ANA were normal.    An NCV/EMG was more consistent with mild S1 chronic radiculopathy. There was not evidence of a large fiber polyneuropathy, though a small  fiber polyneuropathy is still possible. ? ? ?07/22/2019 ALL:  ?Jeffrey Good is a 71 y.o. male here today for follow up for dysesthesias and back pain. He continues lamotrigine 159m twice daily and gabapentin 1 in the morning and 2 at night. He also takes hydrocodone 5-3250m1-2 times daily. Last refill 06/18/2019. He has tried  He continues to have "a little heat" in his feet. He feels that his back pain is actually better. He is waling every morning for about a mile. He tries to walk again in the evenings. He has been out of work due to perforated appendix.  ? ?He was hospitalized in 06/2019 for sepsis due to perforated appendix requiring percutaneous drainage. He is being followed by general surgery for consideration of an appendectomy. He was seen yesterday and will return for evaluation on 4/21.  ? ?HISTORY: (copied from my  note on 11/19/2018) ? ?WiJARREN PARAs a [age].o. male here today for follow up for dysesthesias and back pain. He continues lamotrigine 15036mwice daily and gabapentin 300m38mD. He is prescribed 300mg36mee times daily but hasn't increased dose. He uses hydrocodone as needed for pain. Last refill 11/10/2018 for 45 tablets. He states that he usually takes 1 everyday and sometimes he takes two tablets. He feels that back pain and neuropathy are stable. He is walking about 2 miles daily for exercise. 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  ?  ?  ?HISTORY: (copied from Dr SaterGarth Bigness on 05/20/2018) ?  ?Jeffrey Good 66 yo19an with burning dysesthesias and back pain.     ?  ?Update 05/20/2018: ?He is having numbness and tingling dysesthesia in his feet.    Lamotrigine 150 mg helped incompletely.   More recently, gabapentin was started and she is on 300 mg po tid.    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  The leg pain is fairly symmetric.   Pain is worse at night and when he first wakes up and sometimes after a long day of driving..  HeMarland Kitchensometimes needs a hydrocodone when pain is bothering him more.     ?  ?He denies weakness in his legs.    He does not note bladder changes but has ED, helped by Viagra.     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    ?  ?  ?Update 11/13/2017: ?He has numbness in the feet and mild dysesthesia.  Lamotrigine 150 mg twice daily has helped a lot.  He tolerates it well.  He denies any significant weakness.  He feels the balance and gait are doing well.  He walks up to 3 miles a day and also uses an exercise bike in bad weather.  The foot pain is usually worse at night and he takes hydrocodone with benefit.  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  Viagra has been helpful.    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 ?  ?He has some low back pain and feels it is doing about the same.  Moving around helps the back pain when laying down worsens it.  He notes it for at night.  He is working as a  truck driver and needs to take some breaks to move around. ?  ?Update 05/14/2017:    ?He feels his dysesthetic nerve pain is doing well on lamotrigine 150 mg po bid.    He tolerates it well.     He notes no worsening numbness.    He denies any change in strength.    Balance and gait are good.   He walks 3 miles and/or rides exercise bike x 45 minutes daily.      He takes hydrocodone many nights with benefit as his foot pain is worse when he lays down ?  ?His LBP is about the same .   It bothers him more at night or when laying down.   He does better with movements.  Sitting is not as bad.  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    ?  ?He also has had ED since the neuropathy started.    Viagra 50-100 mg usually helps a lot.      ?  ?From 11/07/2016: ?Dysesthetic Pain:   He has a burning pain in both feet. He feels that this is doing better on lamotrigine 150 mg twice a day that on a lower dose. There are still some days that hurt more and hydrocodone will help. Pain is worse on the bottom of the foot than the top of the foot. He does not note any weakness. He does not feel that this has worsened.   He feels he walks well. He does not have any weakness. Balance is fine.   He has not had any bladder or bowel changes. Etiology of the neuropathy is unknown. Lab work have been normal and he does not have diabetes or other systemic illnesses .     ?  ?LBP:    LBP is doing better.  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  MRI shows degenerative changes at L5-S1. Nerve conduction study / EMG showed a mild S1 chronic radiculopathy. ?  ?ED:   He reports  erectile dysfunction that is helped by Viagra.   ?  ?History of dysesthesia:     Early 2017, he had the onset of a burning sensation in his feet.  He did not note any change in the color of most of the toes though

## 2021-07-25 NOTE — Patient Instructions (Signed)

## 2021-08-01 ENCOUNTER — Other Ambulatory Visit: Payer: Self-pay | Admitting: Cardiovascular Disease

## 2021-08-08 ENCOUNTER — Encounter: Payer: Self-pay | Admitting: Cardiovascular Disease

## 2021-08-08 ENCOUNTER — Ambulatory Visit: Payer: PRIVATE HEALTH INSURANCE | Admitting: Cardiovascular Disease

## 2021-08-08 ENCOUNTER — Other Ambulatory Visit: Payer: Self-pay | Admitting: Family Medicine

## 2021-08-08 VITALS — BP 116/74 | HR 75 | Ht 70.0 in | Wt 220.0 lb

## 2021-08-08 DIAGNOSIS — M79605 Pain in left leg: Secondary | ICD-10-CM

## 2021-08-08 DIAGNOSIS — I739 Peripheral vascular disease, unspecified: Secondary | ICD-10-CM

## 2021-08-08 DIAGNOSIS — M79604 Pain in right leg: Secondary | ICD-10-CM | POA: Diagnosis not present

## 2021-08-08 DIAGNOSIS — I10 Essential (primary) hypertension: Secondary | ICD-10-CM | POA: Diagnosis not present

## 2021-08-08 NOTE — Patient Instructions (Signed)
Medication Instructions:  ?Your physician recommends that you continue on your current medications as directed. Please refer to the Current Medication list given to you today. ? ?*If you need a refill on your cardiac medications before your next appointment, please call your pharmacy* ? ? ?Lab Work: ?NONE ?If you have labs (blood work) drawn today and your tests are completely normal, you will receive your results only by: ?MyChart Message (if you have MyChart) OR ?A paper copy in the mail ?If you have any lab test that is abnormal or we need to change your treatment, we will call you to review the results. ? ? ?Testing/Procedures: ?Ankle-brachial index doppler study ?Your physician has requested that you have an ankle brachial index (ABI). During this test an ultrasound and blood pressure cuff are used to evaluate the arteries that supply the arms and legs with blood. Allow thirty minutes for this exam. There are no restrictions or special instructions. ? ? ? ?Follow-Up: ?At [facility], you and your health needs are our priority.  As part of our continuing mission to provide you with exceptional heart care, we have created designated Provider Care Teams.  These Care Teams include your primary Cardiologist (physician) and Advanced Practice Providers (APPs -  Physician Assistants and Nurse Practitioners) who all work together to provide you with the care you need, when you need it. ? ?Your next appointment:   ?1 year(s) ? ?The format for your next appointment:   ?In Person ? ?Provider:   ?Kristeen Miss, MD  or Chelsea Aus, PA-C, Eligha Bridegroom, NP, or Tereso Newcomer, PA-C      ? ?  ? ?Important Information About Sugar ? ? ? ? ?  ?

## 2021-08-08 NOTE — Telephone Encounter (Signed)
Pt requesting refill of HYDROcodone-Acetaminophen 5-300 MG TABS at [facility] ? ?
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
## 2021-08-09 MED ORDER — HYDROCODONE-ACETAMINOPHEN 5-300 MG PO TABS: ORAL_TABLET | ORAL | 0 refills | Status: DC

## 2021-08-09 MED ORDER — HYDROCODONE-ACETAMINOPHEN 5-325 MG PO TABS: 1.0000 | ORAL_TABLET | Freq: Every day | ORAL | 0 refills | Status: DC | PRN

## 2021-08-09 NOTE — Addendum Note (Signed)
Addended by: Judi Cong on: 08/09/2021 01:33 PM ? ? Modules accepted: Orders ? ?

## 2021-08-09 NOTE — Telephone Encounter (Signed)
Pharmacy has called to inform they do not have HYDROcodone-acetaminophen (NORCO/VICODIN) 5-325 MG tablet in stock, they are asking for a new Rx to be sent for HYDROcodone-acetaminophen (NORCO/VICODIN) 5-325  ?

## 2021-08-22 ENCOUNTER — Other Ambulatory Visit: Payer: Self-pay | Admitting: Cardiovascular Disease

## 2021-09-06 ENCOUNTER — Ambulatory Visit (HOSPITAL_COMMUNITY)
Admission: RE | Admit: 2021-09-06 | Discharge: 2021-09-06 | Disposition: A | Discharge status: Home / Self Care | Payer: PRIVATE HEALTH INSURANCE | Source: Ambulatory Visit | Attending: Cardiovascular Disease | Admitting: Cardiovascular Disease

## 2021-09-06 DIAGNOSIS — M79604 Pain in right leg: Secondary | ICD-10-CM | POA: Insufficient documentation

## 2021-09-06 DIAGNOSIS — M79605 Pain in left leg: Secondary | ICD-10-CM | POA: Insufficient documentation

## 2021-09-06 DIAGNOSIS — I739 Peripheral vascular disease, unspecified: Secondary | ICD-10-CM | POA: Insufficient documentation

## 2021-09-08 ENCOUNTER — Other Ambulatory Visit: Payer: Self-pay | Admitting: Family Medicine

## 2021-09-08 NOTE — Telephone Encounter (Signed)
Pt request refill for HYDROcodone-acetaminophen (NORCO/VICODIN) 5-325 MG tablet at [id number]. 

## 2021-09-11 MED ORDER — HYDROCODONE-ACETAMINOPHEN 5-325 MG PO TABS: 1.0000 | ORAL_TABLET | Freq: Every day | ORAL | 0 refills | Status: DC | PRN

## 2021-09-11 NOTE — Telephone Encounter (Signed)
Last OV was on 07/25/21.  Next OV is scheduled for 01/25/22.  Last RX was written on 08/09/21 for 45 tabs.   [place] Drug Database has been reviewed.

## 2021-09-17 ENCOUNTER — Other Ambulatory Visit: Payer: Self-pay | Admitting: Cardiovascular Disease

## 2021-09-19 MED ORDER — CARVEDILOL 25 MG PO TABS: 25.0000 mg | ORAL_TABLET | Freq: Two times a day (BID) | ORAL | 3 refills | Status: DC

## 2021-09-19 NOTE — Addendum Note (Signed)
Addended by: Margaret Pyle D on: 09/19/2021 01:35 PM   Modules accepted: Orders

## 2021-10-04 ENCOUNTER — Encounter (INDEPENDENT_AMBULATORY_CARE_PROVIDER_SITE_OTHER): Payer: PRIVATE HEALTH INSURANCE | Admitting: Ophthalmology

## 2021-10-04 DIAGNOSIS — H3582 Retinal ischemia: Secondary | ICD-10-CM

## 2021-10-04 DIAGNOSIS — H25813 Combined forms of age-related cataract, bilateral: Secondary | ICD-10-CM

## 2021-10-04 DIAGNOSIS — I10 Essential (primary) hypertension: Secondary | ICD-10-CM

## 2021-10-04 DIAGNOSIS — H43811 Vitreous degeneration, right eye: Secondary | ICD-10-CM

## 2021-10-04 DIAGNOSIS — H35033 Hypertensive retinopathy, bilateral: Secondary | ICD-10-CM

## 2021-10-04 DIAGNOSIS — H348312 Tributary (branch) retinal vein occlusion, right eye, stable: Secondary | ICD-10-CM

## 2021-10-04 DIAGNOSIS — H3521 Other non-diabetic proliferative retinopathy, right eye: Secondary | ICD-10-CM

## 2021-10-09 NOTE — Progress Notes (Shared)
[facility] - Clinic Note  10/11/2021     CHIEF COMPLAINT Patient presents for Retina Follow Up   HISTORY OF PRESENT ILLNESS: Jeffrey Good is a 71 y.o. male who presents to the clinic today for:   HPI     Retina Follow Up   Patient presents with  CRVO/BRVO.  In right eye.  Severity is moderate.  Duration of 8 months.  Since onset it is stable.  I, the attending physician,  performed the HPI with the patient and updated documentation appropriately.        Comments   Patient states no change in vision OU.      Last edited by Rennis Chris, MD on 10/11/2021 10:42 PM.    Pt states vision is stable, pt states the same floater is still there  Referring physician: Deatra James, MD [phone] [place],  [place] [address]  HISTORICAL INFORMATION:   Selected notes from the MEDICAL RECORD NUMBER Referred by Dr. Conley Rolls for eval of floaters/ischemia/heme OU.   CURRENT MEDICATIONS: Current Outpatient Medications (Ophthalmic Drugs)  Medication Sig   brimonidine (ALPHAGAN) 0.2 % ophthalmic solution Place 1 drop into both eyes 2 (two) times daily.   No current facility-administered medications for this visit. (Ophthalmic Drugs)   Current Outpatient Medications (Other)  Medication Sig   carvedilol (COREG) 25 MG tablet Take 1 tablet (25 mg total) by mouth 2 (two) times daily with a meal.   Cetirizine HCl 10 MG CAPS Take 10 mg by mouth daily.    doxazosin (CARDURA) 2 MG tablet Take 2 tablets (4 mg total) by mouth daily.   gabapentin (NEURONTIN) 300 MG capsule TAKE 1 CAPSULE BY MOUTH THREE TIMES A DAY   lamoTRIgine (LAMICTAL) 150 MG tablet Take 1 tablet (150 mg total) by mouth 2 (two) times daily.   lisinopril (ZESTRIL) 20 MG tablet TAKE 1 TABLET BY MOUTH TWICE A DAY   meloxicam (MOBIC) 7.5 MG tablet Take 7.5 mg by mouth every other day.   sildenafil (VIAGRA) 100 MG tablet One half to one po prn before intercourse   spironolactone (ALDACTONE) 25 MG  tablet TAKE 1 TABLET BY MOUTH EVERY DAY   terbinafine (LAMISIL) 250 MG tablet Take 250 mg by mouth daily.   HYDROcodone-acetaminophen (NORCO/VICODIN) 5-325 MG tablet Take 1-2 tablets by mouth daily as needed for moderate pain.   No current facility-administered medications for this visit. (Other)   REVIEW OF SYSTEMS: ROS   Positive for: Musculoskeletal, Eyes, Allergic/Imm Negative for: Constitutional, Gastrointestinal, Neurological, Skin, Genitourinary, HENT, Endocrine, Cardiovascular, Respiratory, Psychiatric, Heme/Lymph Last edited by Annalee Genta D, COT on 10/11/2021  3:00 PM.     ALLERGIES Allergies  Allergen Reactions   Pregabalin Nausea Only   PAST MEDICAL HISTORY Past Medical History:  Diagnosis Date   Cataract    Mixed form OU   Erectile dysfunction    Hyperlipidemia    Hypertension    Hypertensive retinopathy    OU   Seasonal allergies    Past Surgical History:  Procedure Laterality Date   IR RADIOLOGIST EVAL & MGMT  07/16/2019   IR RADIOLOGIST EVAL & MGMT  07/29/2019   KNEE ARTHROSCOPY     1989 left   LAPAROSCOPIC APPENDECTOMY N/A 10/15/2019   Procedure: LAPAROSCOPIC APPENDECTOMY;  Surgeon: Harriette Bouillon, MD;  Location: [facility] OR;  Service: General;  Laterality: N/A;   FAMILY HISTORY Family History  Problem Relation Age of Onset   Hypertension Mother    Diabetes  Mother    SOCIAL HISTORY Social History   Tobacco Use   Smoking status: Never   Smokeless tobacco: Never  Vaping Use   Vaping Use: Never used  Substance Use Topics   Alcohol use: Yes    Alcohol/week: 1.0 standard drink of alcohol    Types: 1 Cans of beer per week    Comment: very rare   Drug use: No       OPHTHALMIC EXAM:  Base Eye Exam     Visual Acuity (Snellen - Linear)       Right Left   Dist [place] 20/30 20/25 -1   Dist ph [place] 20/20 20/20 -1         Tonometry (Tonopen, 3:07 PM)       Right Left   Pressure 22 23         Pupils       Dark Light Shape React APD   Right 3 2  Round Brisk None   Left 3 2 Round Brisk None         Visual Fields (Counting fingers)       Left Right    Full Full         Extraocular Movement       Right Left    Full, Ortho Full, Ortho         Neuro/Psych     Oriented x3: Yes   Mood/Affect: Normal         Dilation     Both eyes: 1.0% Mydriacyl, 2.5% Phenylephrine @ 3:07 PM           Slit Lamp and Fundus Exam     Slit Lamp Exam       Right Left   Lids/Lashes Dermatochalasis - upper lid Dermatochalasis - upper lid   Conjunctiva/Sclera mild Melanosis nasal and temporal pinguecula, mild melanosis   Cornea arcus, trace tear film debris arcus, trace tear film debris   Anterior Chamber Deep and quiet Deep and quiet   Iris Round and moderately dilated Round and moderately dilated   Lens 2-3+ Nuclear sclerosis, 2-3+ Cortical cataract 2-3+ Nuclear sclerosis, 2-3+ Cortical cataract   Anterior Vitreous Vitreous syneresis, Posterior vitreous detachment, vitreous condensations Vitreous syneresis         Fundus Exam       Right Left   Disc Pink and Sharp, Compact, corkscrew vessel nasally, +PPP Pink and Sharp, Compact, focal temporal PPP   C/D Ratio 0.2 0.2   Macula Flat, Good foveal reflex, mild RPE mottling, No heme or edema Flat, Good foveal reflex, mild RPE mottling, No heme or edema   Vessels attenuated, Tortuous, mild Copper wiring, mild sheathing of SN arterioles, +Telangiectasia superior to disc attenuated, Tortuous, mild Copper wiring   Periphery Attached, peripheral drusen, sclerotic vessels superonasal periphery -- good segmental PRP laser changes; +focal blot hemes and punctate exudates SN quad - stably resolved, No RT/RD Attached, focal punctate CR atrophy superior to disc, No heme            IMAGING AND PROCEDURES  Imaging and Procedures for 10/11/2021  OCT, Retina - OU - Both Eyes       Right Eye Quality was good. Central Foveal Thickness: 278. Progression has been stable. Findings  include normal foveal contour, no IRF, no SRF.   Left Eye Quality was good. Central Foveal Thickness: 270. Progression has been stable. Findings include normal foveal contour, no IRF, no SRF, vitreomacular adhesion .   Notes *Images captured and  stored on drive  Diagnosis / Impression:  NFP, no IRF/SRF OU  Clinical management:  See below  Abbreviations: NFP - Normal foveal profile. CME - cystoid macular edema. PED - pigment epithelial detachment. IRF - intraretinal fluid. SRF - subretinal fluid. EZ - ellipsoid zone. ERM - epiretinal membrane. ORA - outer retinal atrophy. ORT - outer retinal tubulation. SRHM - subretinal hyper-reflective material. IRHM - intraretinal hyper-reflective material            ASSESSMENT/PLAN:   ICD-10-CM   1. 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 Retinal ischemia  H35.82     3. Proliferative retinopathy of right eye  H35.21     4. Posterior vitreous detachment of right eye  H43.811     5. Essential hypertension  I10     6. Hypertensive retinopathy of both eyes  H35.033     7. Combined forms of age-related cataract of both eyes  H25.813     8. Bilateral ocular hypertension  H40.053      1-3. Retinal Ischemia, remote BRVO w/ proliferative retinopathy OD  - exam shows sclerotic vessels in SN peripheral quad  - FA 10.19.21 shows telangectasias w/ late leakage, vascular perfusion defects in superonasal peripheral quad w/ early NV-- suggestive of remote BRVO   - optic disc with nasal corkscrew vessel  - s/p segmental PRP OD (12.08.21) to areas of sclerotic vessels SN periphery -- good laser changes in place  - repeat FA 5.13.22 shows interval improvement in MA and leakage SN peripheral quad  - f/u 9-12 months, DFE, OCT  4. PVD / vitreous syneresis OD  - history of floaters OD, onset ~1st of Oct 2021 -- improved  - Discussed findings and prognosis  - No RT or RD on 360 peripheral exam  -  Reviewed s/s of RT/RD  - Strict return precautions for any such RT/RD signs/symptoms  5,6. Hypertensive retinopathy OU - discussed importance of tight BP control - monitor  7. Mixed Cataract OU - The symptoms of cataract, surgical options, and treatments and risks were discussed with patient. - discussed diagnosis and progression - monitor   8. Ocular Hypertension OU  - IOP today: 22,23  - will start brimonidine BID   - f/u 4-6 weeks, IOP check  Ophthalmic Meds Ordered this visit:  Meds ordered this encounter  Medications   brimonidine (ALPHAGAN) 0.2 % ophthalmic solution    Sig: Place 1 drop into both eyes 2 (two) times daily.    Dispense:  10 mL    Refill:  10     Return for f/u 4-6 weeks, IOP check OU, DFE, OCT.  There are no Patient Instructions on file for this visit.  This document serves as a record of services personally performed by Karie Chimera, MD, PhD. It was created on their behalf by De Blanch, an ophthalmic technician. The creation of this record is the provider's dictation and/or activities during the visit.    Electronically signed by: De Blanch, OA, 10/11/21  10:48 PM  Karie Chimera, M.D., Ph.D. Diseases & Surgery of the Retina and Vitreous [facility]  I have reviewed the above documentation for accuracy and completeness, and I agree with the above. Karie Chimera, M.D., Ph.D. 10/11/21 10:48 PM   Abbreviations: M myopia (nearsighted); A astigmatism; H hyperopia (farsighted); P presbyopia; Mrx spectacle prescription;  CTL contact lenses; OD right eye; OS  left eye; OU both eyes  XT exotropia; ET esotropia; PEK punctate epithelial keratitis; PEE punctate epithelial erosions; DES dry eye syndrome; MGD meibomian gland dysfunction; ATs artificial tears; PFAT's preservative free artificial tears; NSC nuclear sclerotic cataract; PSC posterior subcapsular cataract; ERM epi-retinal membrane; PVD posterior vitreous  detachment; RD retinal detachment; DM diabetes mellitus; DR diabetic retinopathy; NPDR non-proliferative diabetic retinopathy; PDR proliferative diabetic retinopathy; CSME clinically significant macular edema; DME diabetic macular edema; dbh dot blot hemorrhages; CWS cotton wool spot; POAG primary open angle glaucoma; C/D cup-to-disc ratio; HVF humphrey visual field; GVF goldmann visual field; OCT optical coherence tomography; IOP intraocular pressure; BRVO Branch retinal vein occlusion; CRVO central retinal vein occlusion; CRAO central retinal artery occlusion; BRAO branch retinal artery occlusion; RT retinal tear; SB scleral buckle; PPV pars plana vitrectomy; VH Vitreous hemorrhage; PRP panretinal laser photocoagulation; IVK intravitreal kenalog; VMT vitreomacular traction; MH Macular hole;  NVD neovascularization of the disc; NVE neovascularization elsewhere; AREDS age related eye disease study; ARMD age related macular degeneration; POAG primary open angle glaucoma; EBMD epithelial/anterior basement membrane dystrophy; ACIOL anterior chamber intraocular lens; IOL intraocular lens; PCIOL posterior chamber intraocular lens; Phaco/IOL phacoemulsification with intraocular lens placement; PRK photorefractive keratectomy; LASIK laser assisted in situ keratomileusis; HTN hypertension; DM diabetes mellitus; COPD chronic obstructive pulmonary disease

## 2021-10-11 ENCOUNTER — Ambulatory Visit (INDEPENDENT_AMBULATORY_CARE_PROVIDER_SITE_OTHER): Payer: PRIVATE HEALTH INSURANCE | Admitting: Ophthalmology

## 2021-10-11 ENCOUNTER — Other Ambulatory Visit: Payer: Self-pay | Admitting: Family Medicine

## 2021-10-11 ENCOUNTER — Other Ambulatory Visit: Payer: Self-pay | Admitting: Cardiovascular Disease

## 2021-10-11 ENCOUNTER — Encounter (INDEPENDENT_AMBULATORY_CARE_PROVIDER_SITE_OTHER): Payer: Self-pay | Admitting: Ophthalmology

## 2021-10-11 DIAGNOSIS — H348312 Tributary (branch) retinal vein occlusion, right eye, stable: Secondary | ICD-10-CM | POA: Diagnosis not present

## 2021-10-11 DIAGNOSIS — H25813 Combined forms of age-related cataract, bilateral: Secondary | ICD-10-CM

## 2021-10-11 DIAGNOSIS — H43811 Vitreous degeneration, right eye: Secondary | ICD-10-CM | POA: Diagnosis not present

## 2021-10-11 DIAGNOSIS — H3582 Retinal ischemia: Secondary | ICD-10-CM

## 2021-10-11 DIAGNOSIS — I10 Essential (primary) hypertension: Secondary | ICD-10-CM | POA: Diagnosis not present

## 2021-10-11 DIAGNOSIS — H40053 Ocular hypertension, bilateral: Secondary | ICD-10-CM

## 2021-10-11 DIAGNOSIS — H3521 Other non-diabetic proliferative retinopathy, right eye: Secondary | ICD-10-CM

## 2021-10-11 DIAGNOSIS — H35033 Hypertensive retinopathy, bilateral: Secondary | ICD-10-CM

## 2021-10-11 MED ORDER — HYDROCODONE-ACETAMINOPHEN 5-325 MG PO TABS: 1.0000 | ORAL_TABLET | Freq: Every day | ORAL | 0 refills | Status: DC | PRN

## 2021-10-11 MED ORDER — BRIMONIDINE TARTRATE 0.2 % OP SOLN: 1.0000 [drp] | Freq: Two times a day (BID) | OPHTHALMIC | 10 refills | Status: AC

## 2021-10-11 NOTE — Telephone Encounter (Signed)
Pt. is requesting a refill for HYDROcodone-acetaminophen (NORCO/VICODIN) 5-325 MG tablet.  Pharmacy: [id number] 

## 2021-11-09 ENCOUNTER — Telehealth: Payer: Self-pay | Admitting: Family Medicine

## 2021-11-09 MED ORDER — HYDROCODONE-ACETAMINOPHEN 5-325 MG PO TABS: 1.0000 | ORAL_TABLET | Freq: Every day | ORAL | 0 refills | Status: DC | PRN

## 2021-11-09 NOTE — Addendum Note (Signed)
Addended by: Judi Cong on: 11/09/2021 03:50 PM   Modules accepted: Orders

## 2021-11-09 NOTE — Telephone Encounter (Signed)
Pt is requesting a refill for HYDROcodone-acetaminophen (NORCO/VICODIN) 5-325 MG tablet  .  Pharmacy: [id number]   

## 2021-11-10 ENCOUNTER — Other Ambulatory Visit: Payer: Self-pay | Admitting: Cardiovascular Disease

## 2021-11-10 NOTE — Telephone Encounter (Signed)
Pt's pharmacy is requesting a refill on sildenafil. Would Dr. Nahser like to refill this medication? Please address 

## 2021-11-13 NOTE — Telephone Encounter (Signed)
Called the pharmacy. They have it on file because soonest it could be filled was 11/11/21 but was sent in on 11/09/21. Called the patient to let him know it was sent and pharmacy will get it ready for him.  Pt verbalized understanding.

## 2021-11-13 NOTE — Telephone Encounter (Signed)
Pt states he called the [id number] on yesterday and was told they have not received any request, please resend refill request

## 2021-11-13 NOTE — Telephone Encounter (Signed)
Med listed on last OV note and pt current on appointments. Will refill at this time

## 2021-11-14 NOTE — Progress Notes (Signed)
[facility] Note  11/22/2021     CHIEF COMPLAINT Patient presents for Retina Follow Up   HISTORY OF PRESENT ILLNESS: Jeffrey Good is a 71 y.o. male who presents to the clinic today for:   HPI     Retina Follow Up   Patient presents with  CRVO/BRVO.  In right eye.  Severity is moderate.  Duration of 6 weeks.  Since onset it is stable.  I, the attending physician,  performed the HPI with the patient and updated documentation appropriately.        Comments   Pt here for 6 wk ret f/u for IOP check OU. Pt states [facility] the same, no changes.       Last edited by Bernarda Caffey, MD on 11/23/2021 10:15 AM.    Pt states he has been using brimonidine BID OU as directed  Referring physician: Donald Prose, MD [place],  [place] [id number]  HISTORICAL INFORMATION:   Selected notes from the MEDICAL RECORD NUMBER Referred by Dr. Marin Comment for eval of floaters/ischemia/heme OU.   CURRENT MEDICATIONS: Current Outpatient Medications (Ophthalmic Drugs)  Medication Sig   brimonidine (ALPHAGAN) 0.2 % ophthalmic solution Place 1 drop into both eyes 2 (two) times daily.   No current facility-administered medications for this visit. (Ophthalmic Drugs)   Current Outpatient Medications (Other)  Medication Sig   carvedilol (COREG) 25 MG tablet Take 1 tablet (25 mg total) by mouth 2 (two) times daily with a meal.   Cetirizine HCl 10 MG CAPS Take 10 mg by mouth daily.    doxazosin (CARDURA) 2 MG tablet TAKE 2 TABLETS EVERY DAY   gabapentin (NEURONTIN) 300 MG capsule TAKE 1 CAPSULE BY MOUTH THREE TIMES A DAY   HYDROcodone-acetaminophen (NORCO/VICODIN) 5-325 MG tablet Take 1-2 tablets by mouth daily as needed for moderate pain.   lamoTRIgine (LAMICTAL) 150 MG tablet Take 1 tablet (150 mg total) by mouth 2 (two) times daily.   lisinopril (ZESTRIL) 20 MG tablet TAKE 1 TABLET BY MOUTH TWICE A DAY   meloxicam (MOBIC) 7.5 MG tablet Take 7.5 mg by mouth  every other day.   sildenafil (VIAGRA) 100 MG tablet TAKE 1/2-1 TABLET AS NEEDED BEFORE INTERCOURSE   spironolactone (ALDACTONE) 25 MG tablet TAKE 1 TABLET BY MOUTH EVERY DAY   terbinafine (LAMISIL) 250 MG tablet Take 250 mg by mouth daily.   No current facility-administered medications for this visit. (Other)   REVIEW OF SYSTEMS: ROS   Positive for: Musculoskeletal, Eyes, Allergic/Imm Negative for: Constitutional, Gastrointestinal, Neurological, Skin, Genitourinary, HENT, Endocrine, Cardiovascular, Respiratory, Psychiatric, Heme/Lymph Last edited by Kingsley Spittle, COT on 11/22/2021  2:56 PM.     ALLERGIES Allergies  Allergen Reactions   Pregabalin Nausea Only   PAST MEDICAL HISTORY Past Medical History:  Diagnosis Date   Cataract    Mixed form OU   Erectile dysfunction    Hyperlipidemia    Hypertension    Hypertensive retinopathy    OU   Seasonal allergies    Past Surgical History:  Procedure Laterality Date   IR RADIOLOGIST EVAL & MGMT  07/16/2019   IR RADIOLOGIST EVAL & MGMT  07/29/2019   KNEE ARTHROSCOPY     1989 left   LAPAROSCOPIC APPENDECTOMY N/A 10/15/2019   Procedure: LAPAROSCOPIC APPENDECTOMY;  Surgeon: Erroll Luna, MD;  Location: [facility] OR;  Service: General;  Laterality: N/A;   FAMILY HISTORY Family History  Problem Relation Age of Onset   Hypertension  Mother    Diabetes Mother    SOCIAL HISTORY Social History   Tobacco Use   Smoking status: Never   Smokeless tobacco: Never  Vaping Use   Vaping Use: Never used  Substance Use Topics   Alcohol use: Yes    Alcohol/week: 1.0 standard drink of alcohol    Types: 1 Cans of beer per week    Comment: very rare   Drug use: No       OPHTHALMIC EXAM:  Base Eye Exam     Visual Acuity (Snellen - Linear)       Right Left   Dist [place] 20/20 20/20         Tonometry (Tonopen, 3:00 PM)       Right Left   Pressure 16 16         Pupils       Dark Light Shape React APD   Right 3 2 Round Brisk  None   Left 3 2 Round Brisk None         Visual Fields (Counting fingers)       Left Right    Full Full         Extraocular Movement       Right Left    Full, Ortho Full, Ortho         Neuro/Psych     Oriented x3: Yes   Mood/Affect: Normal         Dilation     Both eyes: 1.0% Mydriacyl, 2.5% Phenylephrine @ 3:01 PM           Slit Lamp and Fundus Exam     Slit Lamp Exam       Right Left   Lids/Lashes Dermatochalasis - upper lid Dermatochalasis - upper lid   Conjunctiva/Sclera mild Melanosis nasal and temporal pinguecula, mild melanosis   Cornea arcus, trace tear film debris arcus, trace tear film debris   Anterior Chamber Deep and quiet Deep and quiet   Iris Round and moderately dilated Round and moderately dilated   Lens 2-3+ Nuclear sclerosis, 2-3+ Cortical cataract 2-3+ Nuclear sclerosis, 2-3+ Cortical cataract   Anterior Vitreous Vitreous syneresis, Posterior vitreous detachment, vitreous condensations Vitreous syneresis         Fundus Exam       Right Left   Disc Pink and Sharp, Compact, corkscrew vessel nasally, +PPP Pink and Sharp, Compact, focal temporal PPP   C/D Ratio 0.2 0.2   Macula Flat, Good foveal reflex, mild RPE mottling, No heme or edema Flat, Good foveal reflex, mild RPE mottling, No heme or edema   Vessels attenuated, Tortuous, mild Copper wiring, mild sheathing of SN arterioles, +Telangiectasia superior to disc attenuated, Tortuous, mild Copper wiring   Periphery Attached, peripheral drusen, sclerotic vessels superonasal periphery -- good segmental PRP laser changes; +focal blot hemes and punctate exudates SN quad - stably resolved, No RT/RD, No heme Attached, focal punctate CR atrophy superior to disc, No heme            IMAGING AND PROCEDURES  Imaging and Procedures for 11/22/2021  OCT, Retina - OU - Both Eyes       Right Eye Quality was good. Central Foveal Thickness: 279. Progression has been stable. Findings include  normal foveal contour, no IRF, no SRF.   Left Eye Quality was good. Central Foveal Thickness: 277. Progression has been stable. Findings include normal foveal contour, no IRF, no SRF, vitreomacular adhesion .   Notes *Images captured and stored on  drive  Diagnosis / Impression:  NFP, no IRF/SRF OU  Clinical management:  See below  Abbreviations: NFP - Normal foveal profile. CME - cystoid macular edema. PED - pigment epithelial detachment. IRF - intraretinal fluid. SRF - subretinal fluid. EZ - ellipsoid zone. ERM - epiretinal membrane. ORA - outer retinal atrophy. ORT - outer retinal tubulation. SRHM - subretinal hyper-reflective material. IRHM - intraretinal hyper-reflective material            ASSESSMENT/PLAN:   ICD-10-CM   1. 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 Retinal ischemia  H35.82     3. Proliferative retinopathy of right eye  H35.21     4. Posterior vitreous detachment of right eye  H43.811     5. Essential hypertension  I10     6. Hypertensive retinopathy of both eyes  H35.033     7. Combined forms of age-related cataract of both eyes  H25.813     8. Bilateral ocular hypertension  H40.053      1-3. Retinal Ischemia, remote BRVO w/ proliferative retinopathy OD  - exam shows sclerotic vessels in SN peripheral quad  - FA 10.19.21 shows telangectasias w/ late leakage, vascular perfusion defects in superonasal peripheral quad w/ early NV-- suggestive of remote BRVO   - optic disc with nasal corkscrew vessel   - s/p segmental PRP OD (12.08.21) to areas of sclerotic vessels SN periphery -- good laser changes in place  - repeat FA 5.13.22 shows interval improvement in MA and leakage SN peripheral quad  - f/u 9-12 months, DFE, OCT  4. PVD / vitreous syneresis OD  - history of floaters OD, onset ~1st of Oct 2021 -- improved  - Discussed findings and prognosis  - No RT or RD on 360 peripheral exam  -  Reviewed s/s of RT/RD  - Strict return precautions for any such RT/RD signs/symptoms  5,6. Hypertensive retinopathy OU - discussed importance of tight BP control - monitor  7. Mixed Cataract OU - The symptoms of cataract, surgical options, and treatments and risks were discussed with patient. - discussed diagnosis and progression - monitor   8. Ocular Hypertension OU  - IOP today: 16 OU -- improved with brim  - continue brimonidine BID   - monitor  Ophthalmic Meds Ordered this visit:  No orders of the defined types were placed in this encounter.    Return for f/u 9-12 months, retinal ischemia, DFE, OCT.  There are no Patient Instructions on file for this visit.  This document serves as a record of services personally performed by Gardiner Sleeper, MD, PhD. It was created on their behalf by Orvan Falconer, an ophthalmic technician. The creation of this record is the provider's dictation and/or activities during the visit.    Electronically signed by: Orvan Falconer, OA, 11/23/21  10:15 AM  This document serves as a record of services personally performed by Gardiner Sleeper, MD, PhD. It was created on their behalf by San Jetty. Owens Shark, OA an ophthalmic technician. The creation of this record is the provider's dictation and/or activities during the visit.    Electronically signed by: San Jetty. Owens Shark, [place] 08.16.2023 10:15 AM   Gardiner Sleeper, M.D., Ph.D. Diseases & Surgery of the Retina and Vitreous [facility]  I have reviewed the above documentation for accuracy and completeness, and I agree with the above. Gardiner Sleeper, M.D., Ph.D. 11/23/21  10:18 AM   Abbreviations: M myopia (nearsighted); A astigmatism; H hyperopia (farsighted); P presbyopia; Mrx spectacle prescription;  CTL contact lenses; OD right eye; OS left eye; OU both eyes  XT exotropia; ET esotropia; PEK punctate epithelial keratitis; PEE punctate epithelial erosions; DES dry eye syndrome;  MGD meibomian gland dysfunction; ATs artificial tears; PFAT's preservative free artificial tears; [place] nuclear sclerotic cataract; PSC posterior subcapsular cataract; ERM epi-retinal membrane; PVD posterior vitreous detachment; RD retinal detachment; DM diabetes mellitus; DR diabetic retinopathy; NPDR non-proliferative diabetic retinopathy; PDR proliferative diabetic retinopathy; CSME clinically significant macular edema; DME diabetic macular edema; dbh dot blot hemorrhages; CWS cotton wool spot; POAG primary open angle glaucoma; C/D cup-to-disc ratio; HVF humphrey visual field; GVF goldmann visual field; OCT optical coherence tomography; IOP intraocular pressure; BRVO Branch retinal vein occlusion; CRVO central retinal vein occlusion; CRAO central retinal artery occlusion; BRAO branch retinal artery occlusion; RT retinal tear; SB scleral buckle; PPV pars plana vitrectomy; VH Vitreous hemorrhage; PRP panretinal laser photocoagulation; IVK intravitreal kenalog; VMT vitreomacular traction; MH Macular hole;  NVD neovascularization of the disc; NVE neovascularization elsewhere; AREDS age related eye disease study; ARMD age related macular degeneration; POAG primary open angle glaucoma; EBMD epithelial/anterior basement membrane dystrophy; ACIOL anterior chamber intraocular lens; IOL intraocular lens; PCIOL posterior chamber intraocular lens; Phaco/IOL phacoemulsification with intraocular lens placement; Arnold photorefractive keratectomy; LASIK laser assisted in situ keratomileusis; HTN hypertension; DM diabetes mellitus; COPD chronic obstructive pulmonary disease

## 2021-11-18 ENCOUNTER — Other Ambulatory Visit: Payer: Self-pay | Admitting: Cardiovascular Disease

## 2021-11-22 ENCOUNTER — Ambulatory Visit (INDEPENDENT_AMBULATORY_CARE_PROVIDER_SITE_OTHER): Payer: PRIVATE HEALTH INSURANCE | Admitting: Ophthalmology

## 2021-11-22 ENCOUNTER — Encounter (INDEPENDENT_AMBULATORY_CARE_PROVIDER_SITE_OTHER): Payer: Self-pay | Admitting: Ophthalmology

## 2021-11-22 DIAGNOSIS — H43811 Vitreous degeneration, right eye: Secondary | ICD-10-CM

## 2021-11-22 DIAGNOSIS — H25813 Combined forms of age-related cataract, bilateral: Secondary | ICD-10-CM

## 2021-11-22 DIAGNOSIS — H3521 Other non-diabetic proliferative retinopathy, right eye: Secondary | ICD-10-CM

## 2021-11-22 DIAGNOSIS — H3582 Retinal ischemia: Secondary | ICD-10-CM | POA: Diagnosis not present

## 2021-11-22 DIAGNOSIS — H35033 Hypertensive retinopathy, bilateral: Secondary | ICD-10-CM

## 2021-11-22 DIAGNOSIS — I10 Essential (primary) hypertension: Secondary | ICD-10-CM

## 2021-11-22 DIAGNOSIS — H40053 Ocular hypertension, bilateral: Secondary | ICD-10-CM

## 2021-11-22 DIAGNOSIS — H348312 Tributary (branch) retinal vein occlusion, right eye, stable: Secondary | ICD-10-CM | POA: Diagnosis not present

## 2021-11-23 ENCOUNTER — Encounter (INDEPENDENT_AMBULATORY_CARE_PROVIDER_SITE_OTHER): Payer: Self-pay | Admitting: Ophthalmology

## 2021-12-13 ENCOUNTER — Other Ambulatory Visit: Payer: Self-pay | Admitting: Family Medicine

## 2021-12-13 MED ORDER — HYDROCODONE-ACETAMINOPHEN 5-325 MG PO TABS: 1.0000 | ORAL_TABLET | Freq: Every day | ORAL | 0 refills | Status: DC | PRN

## 2021-12-13 NOTE — Telephone Encounter (Signed)
Pt is calling. Requesting a refill on HYDROcodone-acetaminophen (NORCO/VICODIN) 5-325 MG tablet. Pt is requesting refill be sent to [facility] [phone]

## 2021-12-28 ENCOUNTER — Telehealth: Payer: Self-pay | Admitting: Cardiovascular Disease

## 2021-12-28 NOTE — Telephone Encounter (Signed)
Pt c/o BP issue: STAT if pt c/o blurred vision, one-sided weakness or slurred speech  1. What are your last 5 BP readings? 92/61 today about an hour ago  2. Are you having any other symptoms (ex. Dizziness, headache, blurred vision, passed out)? no  3. What is your BP issue? Patient states his BP was checked at work and it was low.

## 2021-12-28 NOTE — Telephone Encounter (Signed)
Left message to call back  

## 2021-12-29 NOTE — Telephone Encounter (Signed)
Lm to call back ./cy 

## 2022-01-01 NOTE — Telephone Encounter (Signed)
Patient is returning call.  °

## 2022-01-01 NOTE — Telephone Encounter (Signed)
Spoke with the patient who states that they checked his BP at work and it was low. He states that he was not having any symptoms. He states that they also told him that their monitor acts up at times. He reports that he monitors his BP at home and it typically runs 120s/80s. Advised patient to continue with current medications and continue to monitor. He will let us know if BP's remain low and if he becomes symptomatic.

## 2022-01-09 IMAGING — DX DG ABD PORTABLE 1V
1 series · 1 of 1 positions shown · non-contrast
Comparison: CT abdomen and pelvis June 26, 2019

CLINICAL DATA: Ileus

EXAM:
PORTABLE ABDOMEN - 1 VIEW

[abdomen kub]
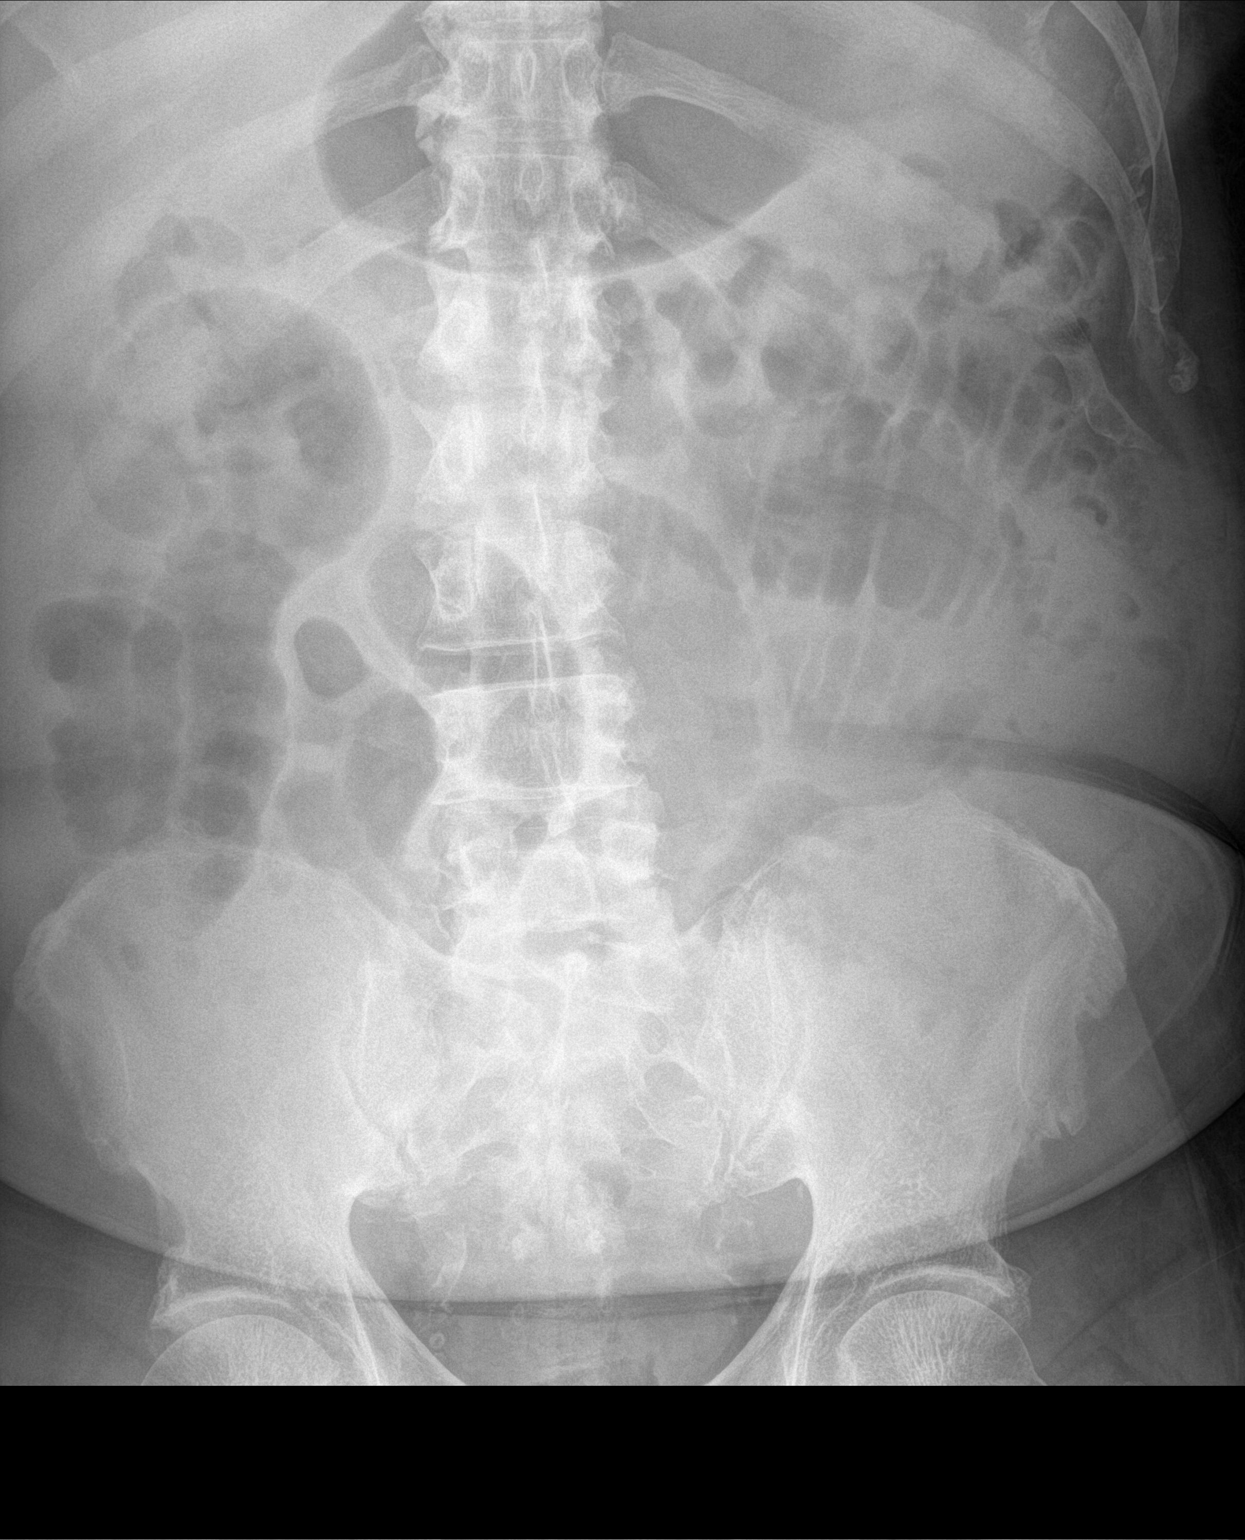

[1 of 1 positions shown; findings below may reference images not displayed]

FINDINGS: There remain loops of dilated bowel, primarily in the left abdomen.
No appreciable air-fluid levels. Air is noted in the rectum. No free
air. There are vascular calcifications in the pelvis.
IMPRESSION: There remain loops of dilated bowel which may be indicative of ileus
or enteritis. A degree of partial bowel obstruction cannot be
excluded. No evident free air. Vascular calcification in pelvis.

## 2022-01-11 ENCOUNTER — Other Ambulatory Visit: Payer: Self-pay | Admitting: Family Medicine

## 2022-01-11 MED ORDER — HYDROCODONE-ACETAMINOPHEN 5-325 MG PO TABS: 1.0000 | ORAL_TABLET | Freq: Every day | ORAL | 0 refills | Status: DC | PRN

## 2022-01-11 NOTE — Telephone Encounter (Signed)
Dr. Jaynee Eagles, can you please e-scribe since Dr. Felecia Shelling is out? You are WID this am.  Pt last seen 07/25/21 and next f/u 01/25/22. Per drug registry, last refilled hydrocodone 12/14/21 #45.

## 2022-01-11 NOTE — Telephone Encounter (Signed)
Pt is calling. Requesting a refill on HYDROcodone-acetaminophen (NORCO/VICODIN) 5-325 MG tablet . [facility] to [id number].

## 2022-01-11 NOTE — Addendum Note (Signed)
Addended by: Wyvonnia Lora on: 01/11/2022 10:33 AM   Modules accepted: Orders

## 2022-01-18 ENCOUNTER — Other Ambulatory Visit: Payer: Self-pay

## 2022-01-18 MED ORDER — GABAPENTIN 300 MG PO CAPS: ORAL_CAPSULE | ORAL | 1 refills | Status: DC

## 2022-01-23 NOTE — Progress Notes (Deleted)
PATIENT: Jeffrey Good DOB: August 04, 1950  REASON FOR VISIT: follow up HISTORY FROM: patient  No chief complaint on file.    HISTORY OF PRESENT ILLNESS:  01/23/22 ALL: Mr Jeffrey Good returns for follow up. He continues lamotrigine 150 BID, gabapentin 300 TID, meloxicam 7.5 QOD and hydrocodone 5-325 1-2 tablets QD PRN.   PMDP shows last refill of hydrocodone 01/11/2022.   07/25/2021 ALL: Mr Jeffrey Good returns for follow up. He is doing well. Symptoms are stable. His feet feel ot at times but manageable. He continues lamotrigine, gabapentin and meloxicam as prescribed. Nudrocodone 1-1.5 tablets daily as needed. He reports he usually uses this when he is working in the yard. PDMP shows regular, time appropriate refills. He continues viagra as needed for ED. He has been out of work for the past three months due to his wife being ill. She is a quadriplegic and had skin ulcer.   01/21/2020 ALL: He returns for follow up. He reports neuropathy pain is stable. He feels that his feet feel hot from time to time but it isn't too bad on medication. He is taking lamotrigine, gabapentin and hydrocodone as prescribed. Meloxicam helps with back pain. He feels that meds work better together. 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 He denies sedative side effects. He takes hydrocodone at night only. Viagra PRN for ED. No adverse effects noted.   01/21/2019 RS: He feels the polyneuropathy is stable.   He gets a burning sensation at times but not as bad as before the medication.     Lamotrigine 150 mg helped incompletely and he has done better with the addition of gabapentin.    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  The leg pain is fairly symmetric.   He has no weakness.   Pain is worse at night and when he first wakes up and sometimes after a long day of driving.Marland Kitchen  He sometimes needs a hydrocodone when pain is bothering him more.       He denies weakness in his legs.    He does not note bladder  changes but has ED, helped by Viagra.     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    Hydrocodone has helped the neuropathic pain as well as the back pain.     The PDMP was reviewed and he is compliant.     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      History of dysesthesia:     Early 2017, he had the onset of a burning sensation in his feet.  He did not note any change in the color of most of the toes though the tip of the big toe did seem to be a mildly different color. Sometimes, he gets a sensation of swelling in the big toes. Initially, he was tried on Lyrica but had stomach upset and stopped. Then, he was tried on gabapentin. 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 He does not think he was on either of these medications long enough to know where the not he got a benefit.  Lamotrigine combined with hydrocodone has helped best..         Studies :    ESR, cryoglobulins, SPEP/IEF and ANA were normal.    An NCV/EMG was more consistent with mild  S1 chronic radiculopathy. There was not evidence of a large fiber polyneuropathy, though a small fiber polyneuropathy is still possible.   07/22/2019 ALL:  Jeffrey Good is a 71 y.o. male here today for follow up for dysesthesias and back pain. He continues lamotrigine 150mg  twice daily and gabapentin 1 in the morning and 2 at night. He also takes hydrocodone 5-325mg  1-2 times daily. Last refill 06/18/2019. He has tried  He continues to have "a little heat" in his feet. He feels that his back pain is actually better. He is waling every morning for about a mile. He tries to walk again in the evenings. He has been out of work due to perforated appendix.   He was hospitalized in 06/2019 for sepsis due to perforated appendix requiring percutaneous drainage. He is being followed by general surgery for  consideration of an appendectomy. He was seen yesterday and will return for evaluation on 4/21.   HISTORY: (copied from my note on 11/19/2018)  Jeffrey Good is a 71 y.o. male here today for follow up for dysesthesias and back pain. He continues lamotrigine 150mg  twice daily and gabapentin 300mg  BID. He is prescribed 300mg  three times daily but hasn't increased dose. He uses hydrocodone as needed for pain. Last refill 11/10/2018 for 45 tablets. He states that he usually takes 1 everyday and sometimes he takes two tablets. He feels that back pain and neuropathy are stable. He is walking about 2 miles daily for exercise. He is a Administrator. He does use Viagra on occasion for ED.      HISTORY: (copied from Dr Garth Bigness note on 05/20/2018)   Jeffrey Good is a 71 yo man with burning dysesthesias and back pain.       Update 05/20/2018: He is having numbness and tingling dysesthesia in his feet.    Lamotrigine 150 mg helped incompletely.   More recently, gabapentin was started and she is on 300 mg po tid.    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  The leg pain is fairly symmetric.   Pain is worse at night and when he first wakes up and sometimes after a long day of driving.Marland Kitchen  He sometimes needs a hydrocodone when pain is bothering him more.       He denies weakness in his legs.    He does not note bladder changes but has ED, helped by Viagra.     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        Update 11/13/2017: He has numbness in the feet and mild dysesthesia.  Lamotrigine 150 mg twice daily has helped a lot.  He tolerates it well.  He denies any significant weakness.  He feels the balance and gait are doing well.  He walks up to 3 miles a day and also uses an exercise bike in bad weather.  The foot pain is usually worse at night and  he takes hydrocodone with benefit.  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  Viagra has been helpful.   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   He has some low back pain and feels it is doing about the same.  Moving around helps the back pain when laying down  worsens it.  He notes it for at night.  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   Update 05/14/2017:    He feels his dysesthetic nerve pain is doing well on lamotrigine 150 mg po bid.    He tolerates it well.     He notes no worsening numbness.    He denies any change in strength.    Balance and gait are good.   He walks 3 miles and/or rides exercise bike x 45 minutes daily.      He takes hydrocodone many nights with benefit as his foot pain is worse when he lays down   His LBP is about the same .   It bothers him more at night or when laying down.   He does better with movements.  Sitting is not as bad.  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      He also has had ED since the neuropathy started.    Viagra 50-100 mg usually helps a lot.        From 11/07/2016: Dysesthetic Pain:   He has a burning pain in both feet. He feels that this is doing better on lamotrigine 150 mg twice a day that on a lower dose. There are still some days that hurt more and hydrocodone will help. Pain is worse on the bottom of the foot than the top of the foot. He does not note any weakness. He does not feel that this has worsened.   He feels he walks well. He does not have any weakness. Balance is fine.   He has not had any bladder or bowel changes. Etiology of the neuropathy is unknown. Lab work have been normal and he does not have diabetes or other systemic illnesses .       LBP:    LBP is doing better.  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  MRI shows  degenerative changes at L5-S1. Nerve conduction study / EMG showed a mild S1 chronic radiculopathy.   ED:   He reports erectile dysfunction that is helped by Viagra.     History of dysesthesia:     Early 2017, he had the onset of a burning sensation in his feet.  He did not note any change in the color of most of the toes though the tip of the big toe did seem to be a mildly different color. Sometimes, he gets a sensation of swelling in the big toes. Initially, he was tried on Lyrica but had stomach upset and stopped. Then, he was tried on gabapentin. 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 He does not think he was on either of these medications long enough to know where the not he got a benefit.  Lamotrigine combined with hydrocodone has helped best..         Studies :    ESR, cryoglobulins, SPEP/IEF and ANA were normal.    An NCV/EMG was more consistent with mild S1 chronic radiculopathy. There was not evidence of a large fiber polyneuropathy, though a small fiber polyneuropathy is still possible.     REVIEW OF SYSTEMS: Out of a complete 14 system review of symptoms, the patient complains only of the following symptoms, numbness, burning, back pain and all other  reviewed systems are negative.  ALLERGIES: Allergies  Allergen Reactions   Pregabalin Nausea Only    HOME MEDICATIONS: Outpatient Medications Prior to Visit  Medication Sig Dispense Refill   brimonidine (ALPHAGAN) 0.2 % ophthalmic solution Place 1 drop into both eyes 2 (two) times daily. 10 mL 10   carvedilol (COREG) 25 MG tablet Take 1 tablet (25 mg total) by mouth 2 (two) times daily with a meal. 180 tablet 3   Cetirizine HCl 10 MG CAPS Take 10 mg by mouth daily.      doxazosin (CARDURA) 2 MG tablet TAKE 2 TABLETS EVERY DAY 180 tablet 3   gabapentin (NEURONTIN) 300 MG capsule TAKE 1 CAPSULE BY MOUTH THREE TIMES A DAY 270 capsule 1   HYDROcodone-acetaminophen (NORCO/VICODIN) 5-325  MG tablet Take 1-2 tablets by mouth daily as needed for moderate pain. 45 tablet 0   lamoTRIgine (LAMICTAL) 150 MG tablet Take 1 tablet (150 mg total) by mouth 2 (two) times daily. 180 tablet 1   lisinopril (ZESTRIL) 20 MG tablet TAKE 1 TABLET BY MOUTH TWICE A DAY 180 tablet 3   meloxicam (MOBIC) 7.5 MG tablet Take 7.5 mg by mouth every other day.     sildenafil (VIAGRA) 100 MG tablet TAKE 1/2-1 TABLET AS NEEDED BEFORE INTERCOURSE 30 tablet 3   spironolactone (ALDACTONE) 25 MG tablet TAKE 1 TABLET BY MOUTH EVERY DAY 90 tablet 3   terbinafine (LAMISIL) 250 MG tablet Take 250 mg by mouth daily.     No facility-administered medications prior to visit.    PAST MEDICAL HISTORY: Past Medical History:  Diagnosis Date   Cataract    Mixed form OU   Erectile dysfunction    Hyperlipidemia    Hypertension    Hypertensive retinopathy    OU   Seasonal allergies     PAST SURGICAL HISTORY: Past Surgical History:  Procedure Laterality Date   IR RADIOLOGIST EVAL & MGMT  07/16/2019   IR RADIOLOGIST EVAL & MGMT  07/29/2019   KNEE ARTHROSCOPY     1989 left   LAPAROSCOPIC APPENDECTOMY N/A 10/15/2019   Procedure: LAPAROSCOPIC APPENDECTOMY;  Surgeon: Erroll Luna, MD;  Location: [facility];  Service: General;  Laterality: N/A;    FAMILY HISTORY: Family History  Problem Relation Age of Onset   Hypertension Mother    Diabetes Mother     SOCIAL HISTORY: Social History   Socioeconomic History   Marital status: Married    Spouse name: Not on file   Number of children: Not on file   Years of education: Not on file   Highest education level: Not on file  Occupational History   Not on file  Tobacco Use   Smoking status: Never   Smokeless tobacco: Never  Vaping Use   Vaping Use: Never used  Substance and Sexual Activity   Alcohol use: Yes    Alcohol/week: 1.0 standard drink of alcohol    Types: 1 Cans of beer per week    Comment: very rare   Drug use: No   Sexual activity: Yes  Other Topics  Concern   Not on file  Social History Narrative   Not on file   Social Determinants of Health   Financial Resource Strain: Not on file  Food Insecurity: Not on file  Transportation Needs: Not on file  Physical Activity: Not on file  Stress: Not on file  Social Connections: Not on file  Intimate Partner Violence: Not on file      PHYSICAL EXAM  There  were no vitals filed for this visit.  There is no height or weight on file to calculate BMI.  Generalized: Well developed, in no acute distress  Cardiology: normal rate and rhythm, no murmur noted Respiratory: clear to auscultation bilaterally  Neurological examination  Mentation: Alert oriented to time, place, history taking. Follows all commands speech and language fluent Cranial nerve II-XII: Pupils were equal round reactive to light. Extraocular movements were full, visual field were full on confrontational test. Facial sensation and strength were normal. Uvula tongue midline. Head turning and shoulder shrug  were normal and symmetric. Motor: The motor testing reveals 5 over 5 strength of all 4 extremities. Good symmetric motor tone is noted throughout.  Sensory: Sensory testing is intact to soft touch on all 4 extremities. No evidence of extinction is noted.  Coordination: Cerebellar testing reveals good finger-nose-finger and heel-to-shin bilaterally.  Gait and station: Gait is normal. .   DIAGNOSTIC DATA (LABS, IMAGING, TESTING) - I reviewed patient records, labs, notes, testing and imaging myself where available.      No data to display           Lab Results  Component Value Date   WBC 5.1 10/15/2019   HGB 11.9 (L) 10/15/2019   HCT 38.6 (L) 10/15/2019   MCV 90.0 10/15/2019   PLT 158 10/15/2019      Component Value Date/Time   NA 139 10/15/2019 0629   K 4.2 10/15/2019 0629   CL 105 10/15/2019 0629   CO2 26 10/15/2019 0629   GLUCOSE 97 10/15/2019 0629   BUN 17 10/15/2019 0629   CREATININE 1.00 10/15/2019  0629   CREATININE 1.16 04/05/2016 1408   CALCIUM 9.2 10/15/2019 0629   PROT 7.3 10/15/2019 0629   PROT 7.3 11/16/2014 1635   ALBUMIN 3.6 10/15/2019 0629   AST 20 10/15/2019 0629   ALT 22 10/15/2019 0629   ALKPHOS 58 10/15/2019 0629   BILITOT 0.4 10/15/2019 0629   GFRNONAA >60 10/15/2019 0629   GFRAA >60 10/15/2019 0629   Lab Results  Component Value Date   CHOL 126 09/01/2014   HDL 34.00 (L) 09/01/2014   LDLCALC 71 09/01/2014   TRIG 103.0 09/01/2014   CHOLHDL 4 09/01/2014   No results found for: "HGBA1C" No results found for: "VITAMINB12" No results found for: "TSH"     ASSESSMENT AND PLAN 71 y.o. year old male  has a past medical history of Cataract, Erectile dysfunction, Hyperlipidemia, Hypertension, Hypertensive retinopathy, and Seasonal allergies. here with   No diagnosis found.   Mr Tolen is doing well today.  We will continue lamotrigine 113m BID, gabapentin 3013mTID, meloxicam 7.13m62maily and hydrocodone 1-1.5 tablets daily as prescribed.  I have reviewed PDMP aware and appropriate refills have been requested. Last refill 07/10/2021 for 39 tablets due to supply issue. He will call when refills due. He will continue Viagra as needed. He will stay well-hydrated.  I have encouraged him to stay as active as possible.  He will follow-up with Dr. SatFelecia Shelling 6 months, sooner if needed.  He verbalizes understanding and agreement with this plan.   No orders of the defined types were placed in this encounter.    No orders of the defined types were placed in this encounter.      AmyDebbora PrestoNP-C 01/23/2022, 11:02 AM [facility] [id number]uiGarfieldeEnosburg FallsC [phone]

## 2022-01-25 ENCOUNTER — Ambulatory Visit: Payer: Medicare Other | Admitting: Family Medicine

## 2022-01-26 IMAGING — RF DG SINUS / FISTULA TRACT / ABSCESSOGRAM
3 series · 7 of 7 positions shown · non-contrast
Comparison: none

INDICATION: Ruptured appendicitis with abscess and status post percutaneous
catheter drainage.

[Series 1: one shot · 1 of 1 slices shown (1 of 2)]
[im 1/1]
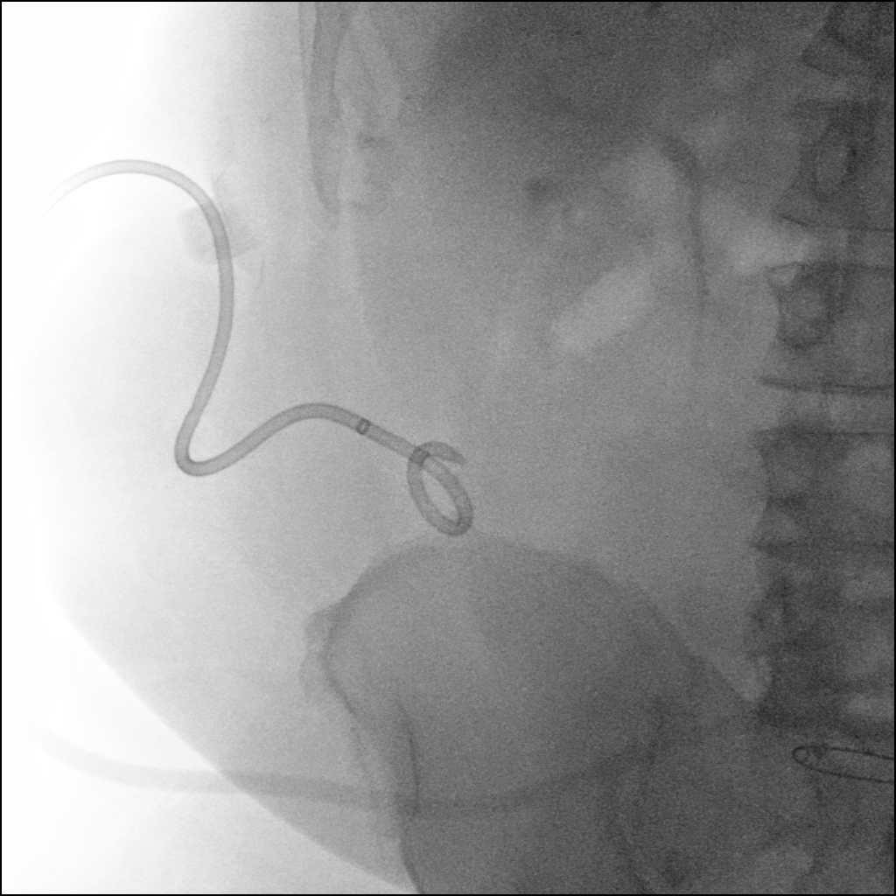

[Series 2: sequence · 4 of 142 frames shown]
[frame 22/142]
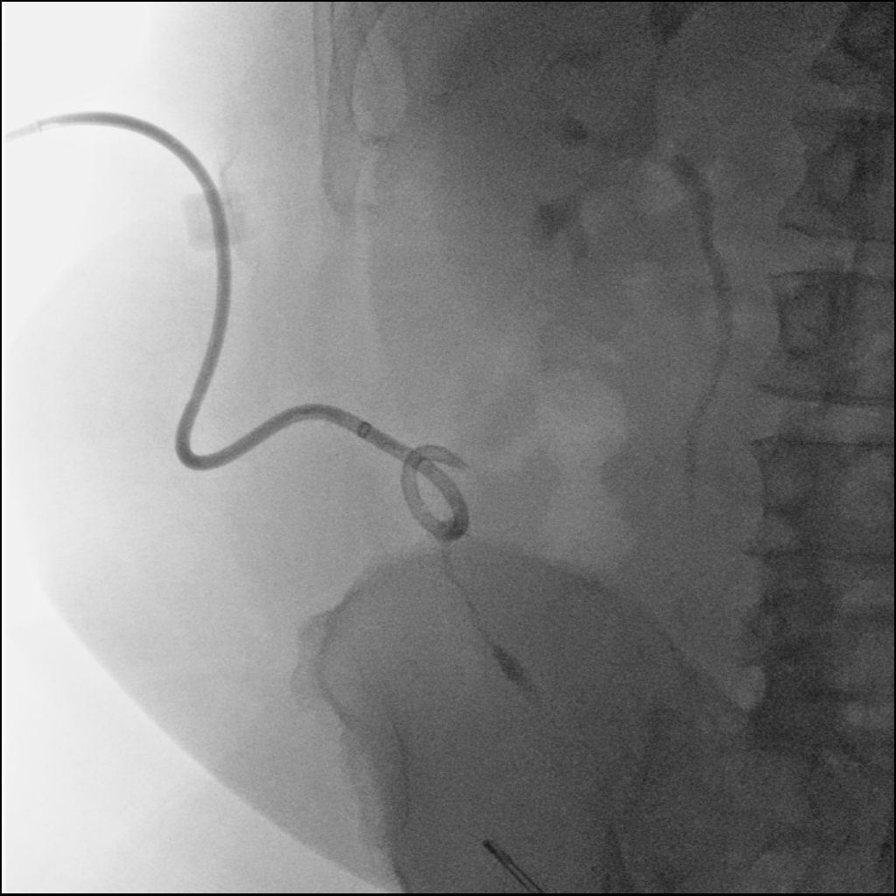
[frame 72/142]
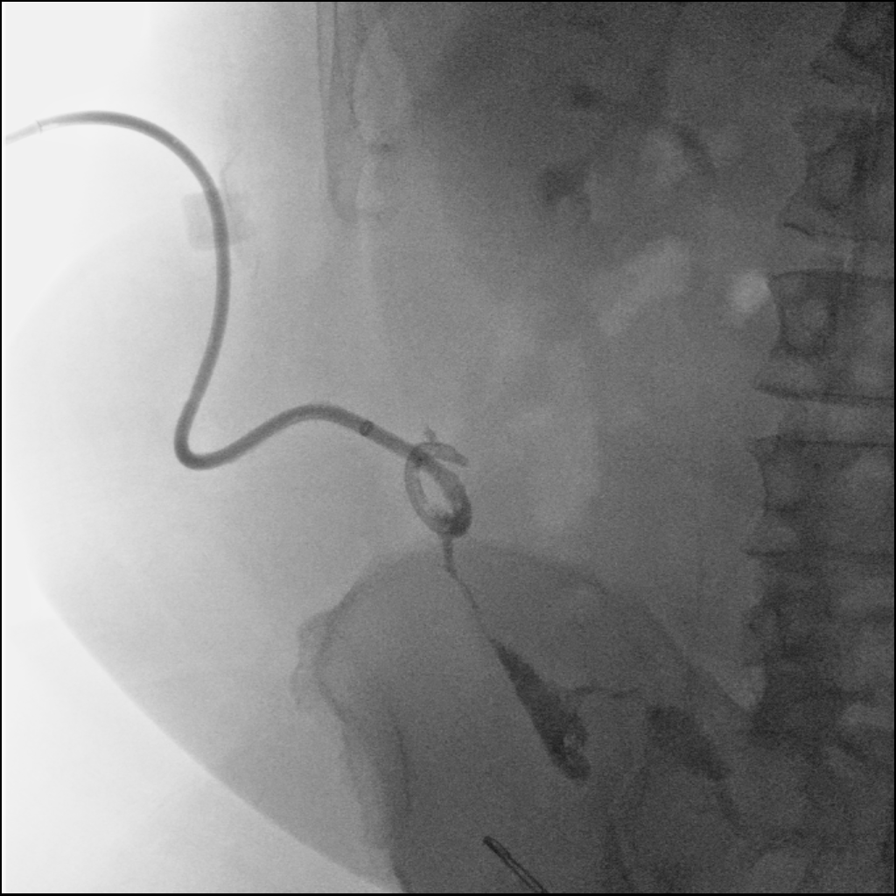
[frame 110/142]
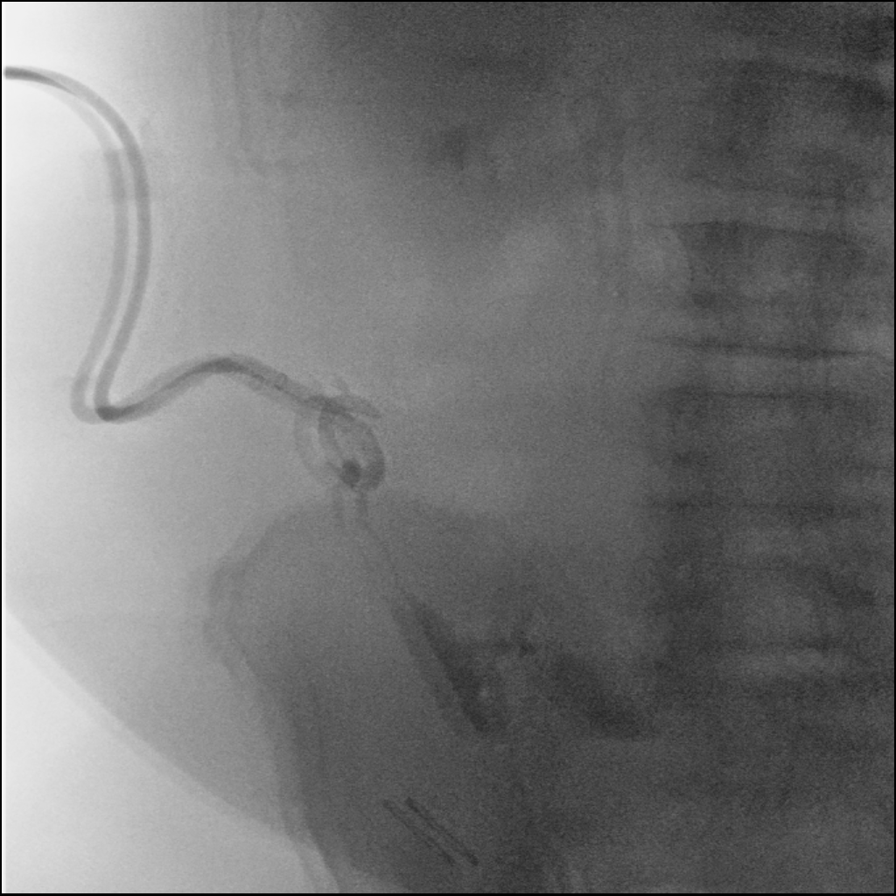
[frame 121/142]
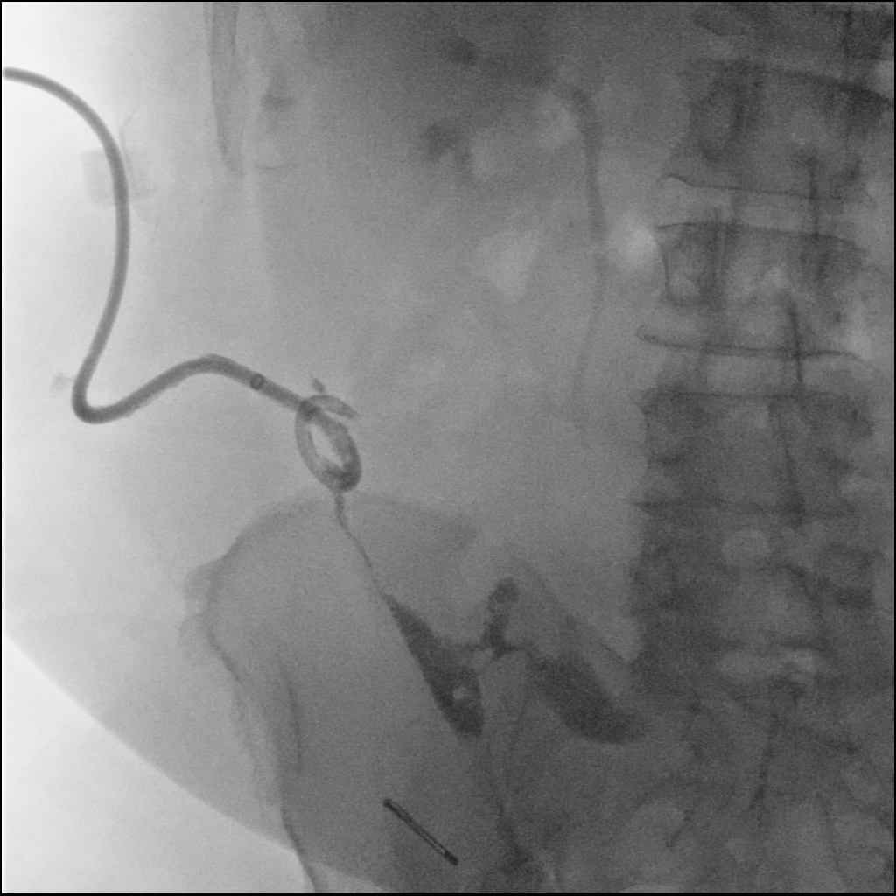

[Series 3: one shot · 0.15mm/px · 2 of 2 slices shown (2 of 2)]
[im 1/2]
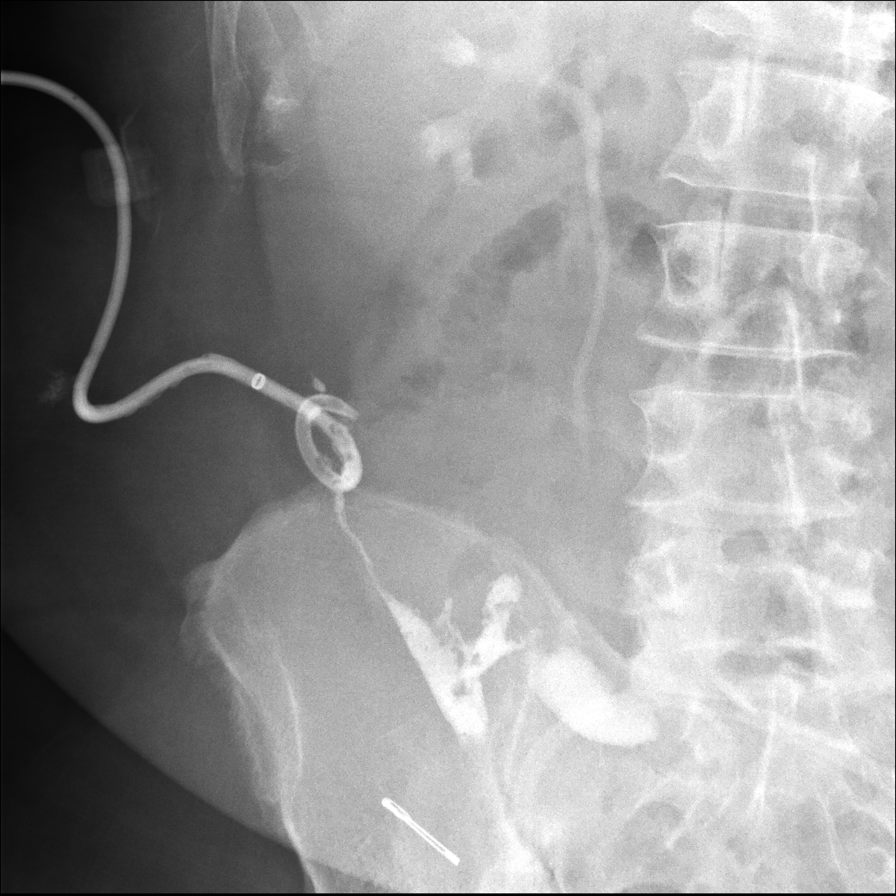
[im 2/2]
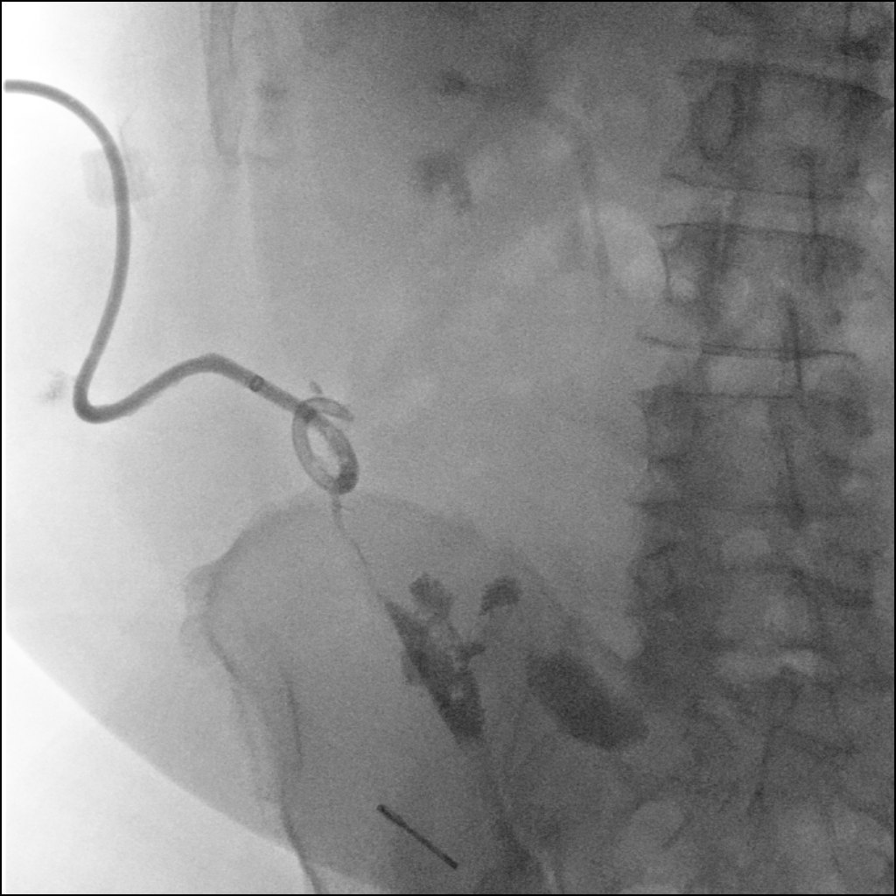

[7 of 7 positions shown; findings below may reference images not displayed]

EXAM:
INJECTION OF ABSCESS DRAINAGE CATHETER UNDER FLUOROSCOPY

CONTRAST:  10 mL Omnipaque 300

FLUOROSCOPY TIME:  48 seconds.

MEDICATIONS:
None

ANESTHESIA/SEDATION:
None

COMPLICATIONS:
None immediate.

PROCEDURE:
The pre-existing right-sided percutaneous drainage catheter was
injected with contrast under fluoroscopy. Fluoroscopic spot images
are obtained. The catheter was then connected to a gravity drainage
bag.
FINDINGS: With injection of the drainage catheter, there is filling of a thin
right lower quadrant fluid collection that then extends medially to
opacify the base of the cecum as well as a dilated appendix.
IMPRESSION: Appendiceal abscess drain injection demonstrates fistula to the base
of the cecum and the appendix.

## 2022-02-01 ENCOUNTER — Other Ambulatory Visit: Payer: Self-pay | Admitting: Neurology

## 2022-02-01 MED ORDER — LAMOTRIGINE 150 MG PO TABS: 150.0000 mg | ORAL_TABLET | Freq: Two times a day (BID) | ORAL | 1 refills | Status: DC

## 2022-02-13 ENCOUNTER — Other Ambulatory Visit: Payer: Self-pay | Admitting: Neurology

## 2022-02-13 MED ORDER — HYDROCODONE-ACETAMINOPHEN 5-325 MG PO TABS: 1.0000 | ORAL_TABLET | Freq: Every day | ORAL | 0 refills | Status: DC | PRN

## 2022-02-13 NOTE — Telephone Encounter (Signed)
Pt is calling. Requesting a refill on medication HYDROcodone-acetaminophen (NORCO/VICODIN) 5-325 MG tablet. Refill should be sent to [id number]

## 2022-03-15 ENCOUNTER — Other Ambulatory Visit: Payer: Self-pay | Admitting: Family Medicine

## 2022-03-15 MED ORDER — HYDROCODONE-ACETAMINOPHEN 5-325 MG PO TABS: 1.0000 | ORAL_TABLET | Freq: Every day | ORAL | 0 refills | Status: DC | PRN

## 2022-03-15 NOTE — Telephone Encounter (Signed)
Pt is requesting a refill for HYDROcodone-acetaminophen (NORCO/VICODIN) 5-325 MG tablet  .  Pharmacy: [facility] [phone]

## 2022-03-15 NOTE — Telephone Encounter (Signed)
Per drug registry, last refilled 02/13/22 #45.  Last seen 07/25/21 and next f/u 05/15/22.

## 2022-04-13 ENCOUNTER — Other Ambulatory Visit: Payer: Self-pay | Admitting: Family Medicine

## 2022-04-13 MED ORDER — HYDROCODONE-ACETAMINOPHEN 5-325 MG PO TABS: 1.0000 | ORAL_TABLET | Freq: Every day | ORAL | 0 refills | Status: DC | PRN

## 2022-04-13 NOTE — Telephone Encounter (Addendum)
Per drug registry, last refilled 03/15/22 #45. Last seen 07/25/21 and next follow up 05/15/22.

## 2022-04-13 NOTE — Telephone Encounter (Signed)
Pt request refill for HYDROcodone-acetaminophen (NORCO/VICODIN) 5-325 MG tablet at [id number]

## 2022-05-03 ENCOUNTER — Telehealth: Payer: Self-pay | Admitting: Neurology

## 2022-05-03 NOTE — Telephone Encounter (Signed)
error 

## 2022-05-03 NOTE — Telephone Encounter (Signed)
Sent mychart msg and letter informing pt of appt change, unable to reach by phone.

## 2022-05-14 ENCOUNTER — Other Ambulatory Visit: Payer: Self-pay | Admitting: Family Medicine

## 2022-05-14 MED ORDER — HYDROCODONE-ACETAMINOPHEN 5-325 MG PO TABS: 1.0000 | ORAL_TABLET | Freq: Every day | ORAL | 0 refills | Status: DC | PRN

## 2022-05-14 NOTE — Telephone Encounter (Signed)
Last seen 07/25/21 and next f/u 06/04/22. Per drug registry, last refilled 04/13/22 #45.

## 2022-05-14 NOTE — Telephone Encounter (Signed)
Called pt and spoke with his wife about refill request for HYDROcodone and informed her that he just had a refill on 04/13/2022 with 45 tablets and that he shouldn't have to need a refill for an "As Needed" prescription. Pt's wife verbalized understanding.

## 2022-05-14 NOTE — Telephone Encounter (Signed)
Pt is requesting a refill for HYDROcodone-acetaminophen (NORCO/VICODIN) 5-325 MG tablet .  Pharmacy: [id number]

## 2022-05-14 NOTE — Addendum Note (Signed)
Addended by: Wyvonnia Lora on: 05/14/2022 03:11 PM   Modules accepted: Orders

## 2022-05-14 NOTE — Telephone Encounter (Addendum)
Pt is calling. Requesting a call back from the nurse. Please call [phone]. Pt said please call him tomorrow because he is at work.

## 2022-05-15 ENCOUNTER — Ambulatory Visit: Payer: Medicare Other | Admitting: Neurology

## 2022-05-16 ENCOUNTER — Ambulatory Visit: Payer: Medicare Other | Admitting: Neurology

## 2022-06-04 ENCOUNTER — Ambulatory Visit (INDEPENDENT_AMBULATORY_CARE_PROVIDER_SITE_OTHER): Payer: PRIVATE HEALTH INSURANCE | Admitting: Neurology

## 2022-06-04 ENCOUNTER — Encounter: Payer: Self-pay | Admitting: Neurology

## 2022-06-04 VITALS — BP 113/70 | HR 69 | Ht 70.0 in | Wt 212.0 lb

## 2022-06-04 DIAGNOSIS — G894 Chronic pain syndrome: Secondary | ICD-10-CM | POA: Diagnosis not present

## 2022-06-04 DIAGNOSIS — N529 Male erectile dysfunction, unspecified: Secondary | ICD-10-CM

## 2022-06-04 DIAGNOSIS — G629 Polyneuropathy, unspecified: Secondary | ICD-10-CM | POA: Diagnosis not present

## 2022-06-04 DIAGNOSIS — L608 Other nail disorders: Secondary | ICD-10-CM | POA: Diagnosis not present

## 2022-06-04 MED ORDER — GABAPENTIN 300 MG PO CAPS: ORAL_CAPSULE | ORAL | 2 refills | Status: DC

## 2022-06-04 MED ORDER — SILDENAFIL CITRATE 100 MG PO TABS: ORAL_TABLET | ORAL | 3 refills | Status: DC

## 2022-06-04 MED ORDER — HYDROCODONE-ACETAMINOPHEN 5-325 MG PO TABS: 1.0000 | ORAL_TABLET | Freq: Every day | ORAL | 0 refills | Status: DC | PRN

## 2022-06-04 MED ORDER — LAMOTRIGINE 150 MG PO TABS: 150.0000 mg | ORAL_TABLET | Freq: Two times a day (BID) | ORAL | 1 refills | Status: DC

## 2022-06-04 NOTE — Patient Instructions (Signed)
For the fingernails:   take zinc and selenium supplements one pill a day each

## 2022-06-04 NOTE — Progress Notes (Signed)
[facility]  PATIENT: Jeffrey Good DOB: March 28, 1951  REFERRING DOCTOR OR PCP:  Donald Prose SOURCE: patient, records from Dr. Nancy Fetter  _________________________________   HISTORICAL  CHIEF COMPLAINT:  Chief Complaint  Patient presents with   Follow-up    RM 1, alone. Last seen 07/28/20. Feet still get hot/intermittent pain in one toe on right foot.     HISTORY OF PRESENT ILLNESS:  Jeffrey Good is a 72 y.o. man with burning dysesthesias and back pain.      Update 06/04/2022: He feels the polyneuropathy is stable.   He reports a burning sensation that is usually mild in toes, right > left and often more noticeable if he lays down.    Lamotrigine 150 mg and gabapentin have helped the pain.  He will take a hydrocodone 1-2 a day if pain is more severe.  The l pain is fairly symmetric.  He denies weakness.Marland Kitchen     He notes his nails are dystrophic in the right hand.  He had left knee pain starting 2 months ago.    He feels theknee is better since exercising on a stationary bike.     He does not note bladder changes but has ED, helped by Viagra.       He notes LBP, especially after a longer truck ride.    Hydrocodone has helped the neuropathic pain as well as the back pain.     The PDMP was reviewed and he is compliant.  We will renew.  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      History of dysesthesia:     Early 2017, he had the onset of a burning sensation in his feet.  He did not note any change in the color of most of the toes though the tip of the big toe did seem to be a mildly different color. Sometimes, he gets a sensation of swelling in the big toes. Initially, he was tried on Lyrica but had stomach upset and stopped. Then, he was tried on gabapentin. 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 He does not think he was on either of these medications long enough to know  where the not he got a benefit.  Lamotrigine combined with hydrocodone has helped best..        Studies :    ESR, cryoglobulins, SPEP/IEF and ANA were normal.    An NCV/EMG was more consistent with mild S1 chronic radiculopathy. There was not evidence of a large fiber polyneuropathy, though a small fiber polyneuropathy is still possible.   REVIEW OF SYSTEMS: Constitutional: No fevers, chills, sweats, or change in appetite Eyes: No visual changes, double vision, eye pain Ear, nose and throat: No hearing loss, ear pain, nasal congestion, sore throat Cardiovascular: No chest pain, palpitations Respiratory:  No shortness of breath at rest or with exertion.   No wheezes GastrointestinaI: No nausea, vomiting, diarrhea, abdominal pain, fecal incontinence Genitourinary:  No dysuria, urinary retention or frequency.  No nocturia.  He has mild ED. Musculoskeletal:  No neck pain, back pain Integumentary: No rash, pruritus, skin lesions Neurological: as above Psychiatric: No depression at this time.  No anxiety Endocrine: No palpitations, diaphoresis, change in appetite, change in weigh or increased thirst Hematologic/Lymphatic:  No anemia, purpura, petechiae.   ALLERGIES: Allergies  Allergen Reactions   Pregabalin Nausea Only    HOME MEDICATIONS:  Current Outpatient Medications:  carvedilol (COREG) 25 MG tablet, TAKE 1 TABLET BY MOUTH TWICE A DAY, KEEP UPCOMMING APT, Disp: 180 tablet, Rfl: 2   Cetirizine HCl 10 MG CAPS, Take 10 mg by mouth daily. , Disp: , Rfl:    doxazosin (CARDURA) 2 MG tablet, Take 2 tablets (4 mg total) by mouth daily., Disp: 180 tablet, Rfl: 3   lisinopril (ZESTRIL) 20 MG tablet, TAKE 1 TABLET BY MOUTH TWICE A DAY, Disp: 60 tablet, Rfl: 11   meloxicam (MOBIC) 7.5 MG tablet, Take 1 tablet (7.5 mg total) by mouth daily., Disp: 30 tablet, Rfl: 11   sildenafil (VIAGRA) 100 MG tablet, One half to one po prn before intercourse, Disp: 30 tablet, Rfl: 3   spironolactone  (ALDACTONE) 25 MG tablet, Take 1 tablet (25 mg total) by mouth daily., Disp: 30 tablet, Rfl: 9   gabapentin (NEURONTIN) 300 MG capsule, TAKE 1 CAPSULE BY MOUTH THREE TIMES A DAY, Disp: 270 capsule, Rfl: 1   HYDROcodone-acetaminophen (NORCO/VICODIN) 5-325 MG tablet, Take 1-2 tablets by mouth as needed for moderate pain., Disp: 45 tablet, Rfl: 0   lamoTRIgine (LAMICTAL) 150 MG tablet, Take 1 tablet (150 mg total) by mouth 2 (two) times daily., Disp: 180 tablet, Rfl: 0  PAST MEDICAL HISTORY: Past Medical History:  Diagnosis Date   Cataract    Mixed form OU   Erectile dysfunction    Hyperlipidemia    Hypertension    Hypertensive retinopathy    OU   Seasonal allergies     PAST SURGICAL HISTORY: Past Surgical History:  Procedure Laterality Date   IR RADIOLOGIST EVAL & MGMT  07/16/2019   IR RADIOLOGIST EVAL & MGMT  07/29/2019   KNEE ARTHROSCOPY     1989 left   LAPAROSCOPIC APPENDECTOMY N/A 10/15/2019   Procedure: LAPAROSCOPIC APPENDECTOMY;  Surgeon: Erroll Luna, MD;  Location: [facility];  Service: General;  Laterality: N/A;    FAMILY HISTORY: Family History  Problem Relation Age of Onset   Hypertension Mother    Diabetes Mother     SOCIAL HISTORY:  Social History   Socioeconomic History   Marital status: Married    Spouse name: Not on file   Number of children: Not on file   Years of education: Not on file   Highest education level: Not on file  Occupational History   Not on file  Tobacco Use   Smoking status: Never   Smokeless tobacco: Never  Vaping Use   Vaping Use: Never used  Substance and Sexual Activity   Alcohol use: Yes    Alcohol/week: 1.0 standard drink    Types: 1 Cans of beer per week    Comment: very rare   Drug use: No   Sexual activity: Yes  Other Topics Concern   Not on file  Social History Narrative   Not on file   Social Determinants of Health   Financial Resource Strain: Not on file  Food Insecurity: Not on file  Transportation Needs: Not on  file  Physical Activity: Not on file  Stress: Not on file  Social Connections: Not on file  Intimate Partner Violence: Not on file     PHYSICAL EXAM  Vitals:   01/24/21 1504  BP: 115/71  Pulse: 60  Weight: 219 lb 8 oz (99.6 kg)  Height: '5\' 10"'$  (1.778 m)    Body mass index is 31.49 kg/m.   General: The patient is well-developed and well-nourished and in no acute distress.   Fingernail changes on right hand  Back:  The back is nontender. Range of motion is slightly reduced.  Neurologic Exam  Mental status: The patient is alert and oriented x 3 at the time of the examination. The patient has apparent normal recent and remote memory, with an apparently normal attention span and concentration ability.   Speech is normal.  Cranial nerves: Extraocular movements are full.  Facial strength was normal.  Hearing appeared to be normal and symmetric.  Motor:  Muscle bulk is normal.   Muscle tone is normal.  Strength was 5/5 in the arms and legs including the feet.  Sensory:  He has intact sensation to touch and temperature and vibration in the arms.  He had mild reduced vibration sense and pinprick sense at the toes compared to the ankles which were the same as the knees  Coordination: Cerebellar testing reveals good finger-nose-finger and heel-to-shin bilaterally.  Gait and station: Station is normal.  Gait is normal..  Tandem is slightly wide    Romberg is  negative.   Reflexes: Deep tendon reflexes are symmetric and normal bilaterally.      DIAGNOSTIC DATA (LABS, IMAGING, TESTING) - I reviewed patient records, labs, notes, testing and imaging myself where available.  Lab Results  Component Value Date   WBC 5.1 10/15/2019   HGB 11.9 (L) 10/15/2019   HCT 38.6 (L) 10/15/2019   MCV 90.0 10/15/2019   PLT 158 10/15/2019      Component Value Date/Time   NA 139 10/15/2019 0629   K 4.2 10/15/2019 0629   CL 105 10/15/2019 0629   CO2 26 10/15/2019 0629   GLUCOSE 97 10/15/2019  0629   BUN 17 10/15/2019 0629   CREATININE 1.00 10/15/2019 0629   CREATININE 1.16 04/05/2016 1408   CALCIUM 9.2 10/15/2019 0629   PROT 7.3 10/15/2019 0629   PROT 7.3 11/16/2014 1635   ALBUMIN 3.6 10/15/2019 0629   AST 20 10/15/2019 0629   ALT 22 10/15/2019 0629   ALKPHOS 58 10/15/2019 0629   BILITOT 0.4 10/15/2019 0629   GFRNONAA >60 10/15/2019 0629   GFRAA >60 10/15/2019 0629   Lab Results  Component Value Date   CHOL 126 09/01/2014   HDL 34.00 (L) 09/01/2014   LDLCALC 71 09/01/2014   TRIG 103.0 09/01/2014   CHOLHDL 4 09/01/2014       ASSESSMENT AND PLAN  Polyneuropathy  Chronic pain syndrome  Lumbosacral radiculopathy at S1  Erectile dysfunction, unspecified erectile dysfunction type  Dysesthesia   1.   Continue lamotrigine 150 mg p.o. twice daily and gabapentin 300 mg 3 times a day for the dysesthetic neuropathic pain.   2.   Continue Hydrocodone 1-2 at bedtime for neuropathy and back pain.    45 pills needs to last 30 days.   NCCS[place] reviewed and he is compliant.   He has never had drug seeking behavior.   3.   Renew Viagra as needed for ED 4.   Discussed trying zinc and selenium mineral supplements rtc 6 mos or sooner if  new or worsening symptoms   Emaya Preston A. Felecia Shelling, MD, PhD [id number], [id number] PM Certified in Neurology, Clinical Neurophysiology, Sleep Medicine, Pain Medicine and Neuroimaging  [facility] [address] [place], [place] [address] [phone] =

## 2022-06-21 ENCOUNTER — Other Ambulatory Visit: Payer: Self-pay | Admitting: *Deleted

## 2022-06-21 NOTE — Telephone Encounter (Signed)
Per 06/02/22 note "Continue Hydrocodone 1-2 at bedtime for neuropathy and back pain."  On 07/25/21 note Amy said "Meloxicam helps with back pain. " Rx denied pt saw Dr. Felecia Shelling in Feb and pt using hydrocodone for back pain now.

## 2022-06-26 ENCOUNTER — Telehealth: Payer: Self-pay | Admitting: *Deleted

## 2022-06-26 NOTE — Telephone Encounter (Signed)
PA needed for Sildenafil Citrate 100MG  tablets. Coverymymeds key: [id number]

## 2022-07-02 ENCOUNTER — Other Ambulatory Visit (HOSPITAL_COMMUNITY): Payer: Self-pay

## 2022-07-03 ENCOUNTER — Telehealth: Payer: Self-pay | Admitting: Cardiovascular Disease

## 2022-07-03 NOTE — Telephone Encounter (Signed)
Patient left he has been having pain in his left leg, from the knee  down, for the past few weeks.

## 2022-07-03 NOTE — Telephone Encounter (Signed)
Pt c/o left leg pain in knee to shin.  There is also noted swelling per pt.  Pain/discomfort/swelling is mostly in the front and does not go down to feet/ankle.  No warmth/redness.  Nothing seems to cause it to be worse or better.  He denies any known injury to the area.  He wears a knee braces that does help "some."  Advised to contact his PCP office to be seen in follow up or evaluation/treatment.  He states understanding.

## 2022-07-05 ENCOUNTER — Other Ambulatory Visit (HOSPITAL_COMMUNITY): Payer: Self-pay

## 2022-07-05 NOTE — Telephone Encounter (Signed)
   Was asked to do a PA for this medication-per test bill this is too soon.

## 2022-07-05 NOTE — Telephone Encounter (Signed)
Noted  

## 2022-07-10 ENCOUNTER — Other Ambulatory Visit: Payer: Self-pay | Admitting: Family Medicine

## 2022-07-10 MED ORDER — HYDROCODONE-ACETAMINOPHEN 5-325 MG PO TABS: 1.0000 | ORAL_TABLET | Freq: Every day | ORAL | 0 refills | Status: DC | PRN

## 2022-07-10 NOTE — Telephone Encounter (Signed)
Pt called, requesting refill on HYDROcodone-acetaminophen (NORCO/VICODIN) 5-325 MG tablet. [id number]

## 2022-07-10 NOTE — Telephone Encounter (Signed)
Last seen 06/04/22 and next f/u 12/12/22. Per drug registry, last refilled 06/05/22 #45.

## 2022-07-23 ENCOUNTER — Ambulatory Visit: Payer: Medicare Other | Admitting: Family Medicine

## 2022-08-03 ENCOUNTER — Other Ambulatory Visit: Payer: Self-pay | Admitting: Cardiovascular Disease

## 2022-08-13 ENCOUNTER — Other Ambulatory Visit: Payer: Self-pay | Admitting: Neurology

## 2022-08-13 MED ORDER — HYDROCODONE-ACETAMINOPHEN 5-325 MG PO TABS: 1.0000 | ORAL_TABLET | Freq: Every day | ORAL | 0 refills | Status: DC | PRN

## 2022-08-13 NOTE — Telephone Encounter (Signed)
Pt requesting a refill on HYDROcodone-acetaminophen (NORCO/VICODIN) 5-325 MG tablet. [phone]

## 2022-08-13 NOTE — Telephone Encounter (Signed)
Last seen 06/04/22 and next f/u 12/12/22. Per drug registry, last refilled 07/10/22 #45

## 2022-08-23 NOTE — Progress Notes (Signed)
[facility] - Clinic Note  08/27/2022     CHIEF COMPLAINT Patient presents for Retina Follow Up   HISTORY OF PRESENT ILLNESS: Jeffrey Good is a 72 y.o. male who presents to the clinic today for:   HPI     Retina Follow Up   This started 9 months ago.  Duration of 9 months.  Since onset it is stable.  I, the attending physician,  performed the HPI with the patient and updated documentation appropriately.        Comments   9 month retina follow up ret ischemia pt is reporting no vision changes noticed he is having some floaters but denies any flashes of light       Last edited by Rennis Chris, MD on 08/27/2022 10:19 PM.    Pt states vision is stable  Referring physician: Deatra James, MD [phone] [address] [place],  [place] [address]  HISTORICAL INFORMATION:   Selected notes from the MEDICAL RECORD NUMBER Referred by Dr. Conley Rolls for eval of floaters/ischemia/heme OU.   CURRENT MEDICATIONS: Current Outpatient Medications (Ophthalmic Drugs)  Medication Sig   brimonidine (ALPHAGAN) 0.2 % ophthalmic solution Place 1 drop into both eyes 2 (two) times daily.   No current facility-administered medications for this visit. (Ophthalmic Drugs)   Current Outpatient Medications (Other)  Medication Sig   carvedilol (COREG) 25 MG tablet TAKE 1 TABLET (25 MG TOTAL) BY MOUTH TWICE A DAY WITH MEALS   Cetirizine HCl 10 MG CAPS Take 10 mg by mouth daily.    doxazosin (CARDURA) 2 MG tablet TAKE 2 TABLETS EVERY DAY   gabapentin (NEURONTIN) 300 MG capsule TAKE 1 CAPSULE BY MOUTH THREE TIMES A DAY   HYDROcodone-acetaminophen (NORCO/VICODIN) 5-325 MG tablet Take 1-2 tablets by mouth daily as needed for moderate pain.   lamoTRIgine (LAMICTAL) 150 MG tablet Take 1 tablet (150 mg total) by mouth 2 (two) times daily.   lisinopril (ZESTRIL) 20 MG tablet TAKE 1 TABLET BY MOUTH TWICE A DAY   meloxicam (MOBIC) 7.5 MG tablet Take 7.5 mg by mouth every other day.    sildenafil (VIAGRA) 100 MG tablet TAKE 1/2-1 TABLET AS NEEDED BEFORE INTERCOURSE   spironolactone (ALDACTONE) 25 MG tablet TAKE 1 TABLET BY MOUTH EVERY DAY   terbinafine (LAMISIL) 250 MG tablet Take 250 mg by mouth daily.   No current facility-administered medications for this visit. (Other)   REVIEW OF SYSTEMS: ROS   Positive for: Musculoskeletal, Eyes, Allergic/Imm Negative for: Constitutional, Gastrointestinal, Neurological, Skin, Genitourinary, HENT, Endocrine, Cardiovascular, Respiratory, Psychiatric, Heme/Lymph Last edited by Etheleen Mayhew, COT on 08/27/2022  3:05 PM.      ALLERGIES Allergies  Allergen Reactions   Pregabalin Nausea Only   PAST MEDICAL HISTORY Past Medical History:  Diagnosis Date   Cataract    Mixed form OU   Erectile dysfunction    Hyperlipidemia    Hypertension    Hypertensive retinopathy    OU   Seasonal allergies    Past Surgical History:  Procedure Laterality Date   IR RADIOLOGIST EVAL & MGMT  07/16/2019   IR RADIOLOGIST EVAL & MGMT  07/29/2019   KNEE ARTHROSCOPY     1989 left   LAPAROSCOPIC APPENDECTOMY N/A 10/15/2019   Procedure: LAPAROSCOPIC APPENDECTOMY;  Surgeon: Harriette Bouillon, MD;  Location: [facility];  Service: General;  Laterality: N/A;   FAMILY HISTORY Family History  Problem Relation Age of Onset   Hypertension Mother    Diabetes Mother  SOCIAL HISTORY Social History   Tobacco Use   Smoking status: Never   Smokeless tobacco: Never  Vaping Use   Vaping Use: Never used  Substance Use Topics   Alcohol use: Yes    Alcohol/week: 1.0 standard drink of alcohol    Types: 1 Cans of beer per week    Comment: very rare   Drug use: No       OPHTHALMIC EXAM:  Base Eye Exam     Visual Acuity (Snellen - Linear)       Right Left   Dist [place] 20/20 -1 20/20 -2         Tonometry (Tonopen, 3:08 PM)       Right Left   Pressure 16 17         Pupils       Pupils Dark Light Shape React APD   Right PERRL 3 2 Round  Brisk None   Left PERRL 3 2 Round Brisk None         Visual Fields       Left Right    Full Full         Extraocular Movement       Right Left    Full, Ortho Full, Ortho         Neuro/Psych     Oriented x3: Yes   Mood/Affect: Normal         Dilation     Both eyes: 2.5% Phenylephrine @ 3:08 PM           Slit Lamp and Fundus Exam     Slit Lamp Exam       Right Left   Lids/Lashes Dermatochalasis - upper lid Dermatochalasis - upper lid   Conjunctiva/Sclera mild Melanosis nasal and temporal pinguecula, mild melanosis   Cornea arcus, trace tear film debris arcus, trace tear film debris   Anterior Chamber deep and clear Deep and quiet   Iris Round and dilated Round and dilated   Lens 2-3+ Nuclear sclerosis, 2-3+ Cortical cataract 2-3+ Nuclear sclerosis, 2-3+ Cortical cataract   Anterior Vitreous Vitreous syneresis, Posterior vitreous detachment, vitreous condensations Vitreous syneresis         Fundus Exam       Right Left   Disc Pink and Sharp, Compact, corkscrew vessel nasally, +PPP Pink and Sharp, Compact, focal temporal PPP   C/D Ratio 0.2 0.2   Macula Flat, Good foveal reflex, mild RPE mottling, No heme or edema Flat, Good foveal reflex, mild RPE mottling, No heme or edema   Vessels attenuated, Tortuous, mild Copper wiring, mild sheathing of SN arterioles, +Telangiectasia superior to disc attenuated, mild tortuosity, mild copper wiring   Periphery Attached, peripheral drusen, sclerotic vessels superonasal periphery -- good segmental PRP laser changes; +focal blot hemes and punctate exudates SN quad - stably resolved, No RT/RD, No heme Attached, focal punctate CR atrophy superior to disc, No heme           Refraction     Wearing Rx       Sphere Cylinder Axis Add   Right +0.25 +1.25 125 +2.75   Left -0.25 +1.50 075 +2.75            IMAGING AND PROCEDURES  Imaging and Procedures for 08/27/2022  OCT, Retina - OU - Both Eyes       Right  Eye Quality was good. Central Foveal Thickness: 273. Progression has improved. Findings include normal foveal contour, no IRF, no SRF (Interval improvement in vitreous  opacities).   Left Eye Quality was good. Central Foveal Thickness: 266. Progression has been stable. Findings include normal foveal contour, no IRF, no SRF, vitreomacular adhesion .   Notes *Images captured and stored on drive  Diagnosis / Impression:  NFP, no IRF/SRF OU OD: interval improvement in vit opacities  Clinical management:  See below  Abbreviations: NFP - Normal foveal profile. CME - cystoid macular edema. PED - pigment epithelial detachment. IRF - intraretinal fluid. SRF - subretinal fluid. EZ - ellipsoid zone. ERM - epiretinal membrane. ORA - outer retinal atrophy. ORT - outer retinal tubulation. SRHM - subretinal hyper-reflective material. IRHM - intraretinal hyper-reflective material             ASSESSMENT/PLAN:   ICD-10-CM   1. 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 Retinal ischemia  H35.82     3. Proliferative retinopathy of right eye  H35.21     4. Posterior vitreous detachment of right eye  H43.811     5. Essential hypertension  I10     6. Hypertensive retinopathy of both eyes  H35.033     7. Combined forms of age-related cataract of both eyes  H25.813     8. Bilateral ocular hypertension  H40.053      1-3. Retinal Ischemia, remote BRVO w/ proliferative retinopathy OD  - exam shows sclerotic vessels in SN peripheral quad  - FA 10.19.21 shows telangectasias w/ late leakage, vascular perfusion defects in superonasal peripheral quad w/ early NV-- suggestive of remote BRVO   - optic disc with nasal corkscrew vessel   - s/p segmental PRP OD (12.08.21) to areas of sclerotic vessels SN periphery -- good laser changes in place  - repeat FA 5.13.22 shows interval improvement in MA and leakage SN peripheral quad  - f/u 6-9 months, DFE,  OCT, repeat FA (transit OD)  4. PVD / vitreous syneresis OD  - history of floaters OD, onset ~1st of Oct 2021 -- improved  - Discussed findings and prognosis  - No RT or RD on 360 peripheral exam  - Reviewed s/s of RT/RD  - Strict return precautions for any such RT/RD signs/symptoms  5,6. Hypertensive retinopathy OU - discussed importance of tight BP control - monitor  7. Mixed Cataract OU - The symptoms of cataract, surgical options, and treatments and risks were discussed with patient. - discussed diagnosis and progression - monitor   8. Ocular Hypertension OU  - IOP today: 16,17  - continue brimonidine BID   - monitor  Ophthalmic Meds Ordered this visit:  No orders of the defined types were placed in this encounter.    Return for f/u 6-9 months, DFE, OCT, FA.  There are no Patient Instructions on file for this visit.  This document serves as a record of services personally performed by Karie Chimera, MD, PhD. It was created on their behalf by De Blanch, an ophthalmic technician. The creation of this record is the provider's dictation and/or activities during the visit.    Electronically signed by: De Blanch, OA, 08/27/22  10:21 PM  This document serves as a record of services personally performed by Karie Chimera, MD, PhD. It was created on their behalf by Glee Arvin. Manson Passey, OA an ophthalmic technician. The creation of this record is the provider's dictation and/or activities during the visit.    Electronically signed by: Glee Arvin. Joy, [place] 05.20.2024 10:21 PM  Isaias Cowman.  Vanessa Barbara, M.D., Ph.D. Diseases & Surgery of the Retina and Vitreous [facility]  I have reviewed the above documentation for accuracy and completeness, and I agree with the above. Karie Chimera, M.D., Ph.D. 08/27/22 10:22 PM   Abbreviations: M myopia (nearsighted); A astigmatism; H hyperopia (farsighted); P presbyopia; Mrx spectacle prescription;  CTL contact  lenses; OD right eye; OS left eye; OU both eyes  XT exotropia; ET esotropia; PEK punctate epithelial keratitis; PEE punctate epithelial erosions; DES dry eye syndrome; MGD meibomian gland dysfunction; ATs artificial tears; PFAT's preservative free artificial tears; NSC nuclear sclerotic cataract; PSC posterior subcapsular cataract; ERM epi-retinal membrane; PVD posterior vitreous detachment; RD retinal detachment; DM diabetes mellitus; DR diabetic retinopathy; NPDR non-proliferative diabetic retinopathy; PDR proliferative diabetic retinopathy; CSME clinically significant macular edema; DME diabetic macular edema; dbh dot blot hemorrhages; CWS cotton wool spot; POAG primary open angle glaucoma; C/D cup-to-disc ratio; HVF humphrey visual field; GVF goldmann visual field; OCT optical coherence tomography; IOP intraocular pressure; BRVO Branch retinal vein occlusion; CRVO central retinal vein occlusion; CRAO central retinal artery occlusion; BRAO branch retinal artery occlusion; RT retinal tear; SB scleral buckle; PPV pars plana vitrectomy; VH Vitreous hemorrhage; PRP panretinal laser photocoagulation; IVK intravitreal kenalog; VMT vitreomacular traction; MH Macular hole;  NVD neovascularization of the disc; NVE neovascularization elsewhere; AREDS age related eye disease study; ARMD age related macular degeneration; POAG primary open angle glaucoma; EBMD epithelial/anterior basement membrane dystrophy; ACIOL anterior chamber intraocular lens; IOL intraocular lens; PCIOL posterior chamber intraocular lens; Phaco/IOL phacoemulsification with intraocular lens placement; PRK photorefractive keratectomy; LASIK laser assisted in situ keratomileusis; HTN hypertension; DM diabetes mellitus; COPD chronic obstructive pulmonary disease

## 2022-08-27 ENCOUNTER — Encounter (INDEPENDENT_AMBULATORY_CARE_PROVIDER_SITE_OTHER): Payer: Self-pay | Admitting: Ophthalmology

## 2022-08-27 ENCOUNTER — Ambulatory Visit (INDEPENDENT_AMBULATORY_CARE_PROVIDER_SITE_OTHER): Payer: PRIVATE HEALTH INSURANCE | Admitting: Ophthalmology

## 2022-08-27 DIAGNOSIS — H348312 Tributary (branch) retinal vein occlusion, right eye, stable: Secondary | ICD-10-CM | POA: Diagnosis not present

## 2022-08-27 DIAGNOSIS — H40053 Ocular hypertension, bilateral: Secondary | ICD-10-CM

## 2022-08-27 DIAGNOSIS — H3521 Other non-diabetic proliferative retinopathy, right eye: Secondary | ICD-10-CM

## 2022-08-27 DIAGNOSIS — H3582 Retinal ischemia: Secondary | ICD-10-CM

## 2022-08-27 DIAGNOSIS — H43811 Vitreous degeneration, right eye: Secondary | ICD-10-CM

## 2022-08-27 DIAGNOSIS — H25813 Combined forms of age-related cataract, bilateral: Secondary | ICD-10-CM

## 2022-08-27 DIAGNOSIS — I10 Essential (primary) hypertension: Secondary | ICD-10-CM

## 2022-08-27 DIAGNOSIS — H35033 Hypertensive retinopathy, bilateral: Secondary | ICD-10-CM

## 2022-09-05 ENCOUNTER — Telehealth: Payer: Self-pay | Admitting: Cardiovascular Disease

## 2022-09-05 DIAGNOSIS — Z0279 Encounter for issue of other medical certificate: Secondary | ICD-10-CM

## 2022-09-05 NOTE — Telephone Encounter (Signed)
Received DOT form and payment. Form in Dr. Harvie Bridge box.

## 2022-09-06 NOTE — Telephone Encounter (Signed)
Paperwork completed, signed by md, supporting documentation printed and all faxed with confirmation received. Turned back into to front office staff.

## 2022-09-13 ENCOUNTER — Other Ambulatory Visit: Payer: Self-pay | Admitting: Neurology

## 2022-09-13 MED ORDER — HYDROCODONE-ACETAMINOPHEN 5-325 MG PO TABS: 1.0000 | ORAL_TABLET | Freq: Every day | ORAL | 0 refills | Status: DC | PRN

## 2022-09-13 NOTE — Telephone Encounter (Signed)
Pt called needing a refill request for his HYDROcodone-acetaminophen (NORCO/VICODIN) 5-325 MG tablet  [facility] [address]

## 2022-09-13 NOTE — Telephone Encounter (Signed)
Last seen 06/04/22 and next f/u 12/12/22. Last refilled hydrocodone 08/13/22 #45.

## 2022-10-07 ENCOUNTER — Other Ambulatory Visit: Payer: Self-pay | Admitting: Cardiovascular Disease

## 2022-10-14 ENCOUNTER — Encounter: Payer: Self-pay | Admitting: Cardiovascular Disease

## 2022-10-14 ENCOUNTER — Other Ambulatory Visit (INDEPENDENT_AMBULATORY_CARE_PROVIDER_SITE_OTHER): Payer: Self-pay | Admitting: Ophthalmology
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
## 2022-10-15 ENCOUNTER — Encounter: Payer: Self-pay | Admitting: Cardiovascular Disease

## 2022-10-15 ENCOUNTER — Ambulatory Visit: Payer: PRIVATE HEALTH INSURANCE | Attending: Cardiovascular Disease | Admitting: Cardiovascular Disease

## 2022-10-15 ENCOUNTER — Other Ambulatory Visit: Payer: Self-pay | Admitting: Neurology

## 2022-10-15 VITALS — BP 118/80 | HR 66 | Ht 70.0 in | Wt 216.6 lb

## 2022-10-15 DIAGNOSIS — I10 Essential (primary) hypertension: Secondary | ICD-10-CM | POA: Diagnosis not present

## 2022-10-15 DIAGNOSIS — Z79899 Other long term (current) drug therapy: Secondary | ICD-10-CM | POA: Diagnosis not present

## 2022-10-15 MED ORDER — HYDROCODONE-ACETAMINOPHEN 5-325 MG PO TABS: 1.0000 | ORAL_TABLET | Freq: Every day | ORAL | 0 refills | Status: DC | PRN

## 2022-10-15 NOTE — Telephone Encounter (Signed)
Pt last seen on 06/04/22 Follow up scheduled on 12/12/22 Last filled on 09/13/22 #45 tablets (23 day supply) Rx pending to be signed

## 2022-10-15 NOTE — Patient Instructions (Addendum)
Please call [facility] for appt about nail and skin changes.  [phone]  Medication Instructions:  Your physician recommends that you continue on your current medications as directed. Please refer to the Current Medication list given to you today.  *If you need a refill on your cardiac medications before your next appointment, please call your pharmacy*   Lab Work: BMET today If you have labs (blood work) drawn today and your tests are completely normal, you will receive your results only by: MyChart Message (if you have MyChart) OR A paper copy in the mail If you have any lab test that is abnormal or we need to change your treatment, we will call you to review the results.   Testing/Procedures: NONE   Follow-Up: At [facility], you and your health needs are our priority.  As part of our continuing mission to provide you with exceptional heart care, we have created designated Provider Care Teams.  These Care Teams include your primary Cardiologist (physician) and Advanced Practice Providers (APPs -  Physician Assistants and Nurse Practitioners) who all work together to provide you with the care you need, when you need it.  We recommend signing up for the patient portal called "MyChart".  Sign up information is provided on this After Visit Summary.  MyChart is used to connect with patients for Virtual Visits (Telemedicine).  Patients are able to view lab/test results, encounter notes, upcoming appointments, etc.  Non-urgent messages can be sent to your provider as well.   To learn more about what you can do with MyChart, go to [email or web address].    Your next appointment:   1 year(s)  Provider:   Kristeen Miss, MD

## 2022-10-15 NOTE — Telephone Encounter (Signed)
Pt is requesting a refill for HYDROcodone-acetaminophen (NORCO/VICODIN) 5-325 MG tablet .  [id number] 

## 2022-10-16 LAB — BASIC METABOLIC PANEL
BUN/Creatinine Ratio: 13 (ref 10–24)
BUN: 16 mg/dL (ref 8–27)
CO2: 24 mmol/L (ref 20–29)
Calcium: 9.7 mg/dL (ref 8.6–10.2)
Chloride: 97 mmol/L (ref 96–106)
Creatinine, Ser: 1.2 mg/dL (ref 0.76–1.27)
Glucose: 93 mg/dL (ref 70–99)
Potassium: 4.2 mmol/L (ref 3.5–5.2)
Sodium: 135 mmol/L (ref 134–144)
eGFR: 65 mL/min/{1.73_m2} (ref >59)

## 2022-11-13 ENCOUNTER — Other Ambulatory Visit: Payer: Self-pay | Admitting: Cardiovascular Disease

## 2022-11-14 ENCOUNTER — Other Ambulatory Visit: Payer: Self-pay | Admitting: Neurology

## 2022-11-14 MED ORDER — HYDROCODONE-ACETAMINOPHEN 5-325 MG PO TABS: 1.0000 | ORAL_TABLET | Freq: Every day | ORAL | 0 refills | Status: DC | PRN

## 2022-11-14 NOTE — Telephone Encounter (Signed)
Pt is requesting a refill for HYDROcodone-acetaminophen (NORCO/VICODIN) 5-325 MG tablet .  Pharmacy: [id number]

## 2022-11-14 NOTE — Telephone Encounter (Signed)
Requested Prescriptions   Pending Prescriptions Disp Refills   HYDROcodone-acetaminophen (NORCO/VICODIN) 5-325 MG tablet 45 tablet 0    Sig: Take 1-2 tablets by mouth daily as needed for moderate pain.   Last seen 06/04/22 by sater, next appt scheduled 12/12/22 Dispenses    Dispensed Days Supply Quantity Provider Pharmacy  HYDROCODONE-ACETAMIN 5-325 MG 10/15/2022 22 45 each Sater, Pearletha Furl, MD [facility] [phone] - G...  HYDROCODONE-ACETAMIN 5-325 MG 09/13/2022 23 45 each Sater, Pearletha Furl, MD [facility] [phone] - G...  HYDROCODONE-ACETAMIN 5-325 MG 08/13/2022 23 45 each Sater, Pearletha Furl, MD [facility] [phone] - G...  HYDROCODONE-ACETAMIN 5-325 MG 07/10/2022 22 45 each Sater, Pearletha Furl, MD [facility] [phone] - G...  HYDROCOD/ACETAM 5-325MG  TAB 06/05/2022 23 45 each Sater, Pearletha Furl, MD [facility] [phone] ...  HYDROCODONE-ACETAMIN 5-325 MG 05/14/2022 22 45 each Sater, Pearletha Furl, MD [facility] [phone] - G...  HYDROCODONE-ACETAMIN 5-325 MG 04/13/2022 22 45 each Sater, Pearletha Furl, MD [facility] [phone] - G...  HYDROCODONE-ACETAMIN 5-325 MG 03/15/2022 22 45 each Sater, Pearletha Furl, MD [facility] [phone] - G...  HYDROCOD/ACETAM 5-325MG  TAB 02/13/2022 23 45 each Sater, Pearletha Furl, MD [facility] [phone] ...  HYDROCODONE-ACETAMIN 5-325 MG 01/11/2022 23 45 each Anson Fret, MD [facility] [phone] - G...  HYDROCODONE-ACETAMIN 5-325 MG 12/14/2021 23 45 each Sater, Pearletha Furl, MD [facility] [phone] - G.Marland KitchenMarland Kitchen

## 2022-11-19 ENCOUNTER — Other Ambulatory Visit: Payer: Self-pay | Admitting: Neurology

## 2022-11-19 MED ORDER — HYDROCODONE-ACETAMINOPHEN 5-325 MG PO TABS: 1.0000 | ORAL_TABLET | Freq: Every day | ORAL | 0 refills | Status: DC | PRN

## 2022-11-19 NOTE — Telephone Encounter (Signed)
Last seen on 06/04/22 Follow up scheduled on 12/12/22 Last filled on 10/15/22 #45 tablets (22 day supply) Rx pending to be signed

## 2022-11-19 NOTE — Telephone Encounter (Signed)
Pt called needing a refill on his HYDROcodone-acetaminophen (NORCO/VICODIN) 5-325 MG tablet [facility] [address]  

## 2022-11-20 MED ORDER — HYDROCODONE-ACETAMINOPHEN 5-325 MG PO TABS: 1.0000 | ORAL_TABLET | Freq: Every day | ORAL | 0 refills | Status: DC | PRN

## 2022-11-20 NOTE — Addendum Note (Signed)
Addended by: Aura Camps on: 11/20/2022 02:12 PM   Modules accepted: Orders

## 2022-11-20 NOTE — Telephone Encounter (Addendum)
Dr.Sater can you send Rx to [facility] they have Rx in stock. I called to confirm. [facility] said they can't get [facility] it on back order, I called to confirm as well. Rx pending to be signed.

## 2022-11-20 NOTE — Telephone Encounter (Signed)
Pt states gus local [facility] is out of the medication, he is asking if it can be sent to [id number]

## 2022-12-06 ENCOUNTER — Other Ambulatory Visit: Payer: Self-pay | Admitting: Cardiovascular Disease

## 2022-12-11 NOTE — Progress Notes (Signed)
PATIENT: Jeffrey Good DOB: 01/14/1951  REASON FOR VISIT: follow up HISTORY FROM: patient  Chief Complaint  Patient presents with   Follow-up    Rm EMG 2, alone.  Doing ok, with neuropathy,  having some issue with fungus under fingers and toes on lamisil.  Takes the gabapentin 300mg  noon, 600mg  at bedtime.  Asking for refill hydrocodone, due 12-21-2022 so does not have to call back up here.      HISTORY OF PRESENT ILLNESS:  12/12/22 ALL: Mr Jeffrey Good returns for follow up for dysesthesias and low back pain. He was last seen by Dr Epimenio Foot 05/2022 and doing well.   Dysesthesias are well managed. He has numbness of bilateral great toes and intermittent burning in feet, R>L. He continues LTG 150mg  BID and gabapentin 300mg  in am and 600mg  at bedtime.   LBP is well managed. He uses hydrocodone 5-325mg  1-2 times daily. 45 tablets last 30 days. Last filled 11/20/2022. No weakness. 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   He rarely uses Viagra. It works well when needed.   06/04/2022 RS:  Jeffrey Good is a 72 y.o. man with burning dysesthesias and back pain.      He feels the polyneuropathy is stable.   He reports a burning sensation that is usually mild in toes, right > left and often more noticeable if he lays down.    Lamotrigine 150 mg and gabapentin have helped the pain.  He will take a hydrocodone 1-2 a day if pain is more severe.  The l pain is fairly symmetric.  He denies weakness.Marland Kitchen      He notes his nails are dystrophic in the right hand.  He had left knee pain starting 2 months ago.    He feels the knee is better since exercising on a stationary bike.      He does not note bladder changes but has ED, helped by Viagra.        He notes LBP, especially after a longer truck ride.    Hydrocodone has helped the neuropathic pain as well as the back pain.     The PDMP was reviewed and he is compliant.  We will renew.   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   07/25/2021 ALL: Mr Jeffrey Good returns for follow up. He is doing well. Symptoms are stable. His feet feel ot at times but manageable. He continues lamotrigine, gabapentin and meloxicam as prescribed. Nudrocodone 1-1.5 tablets daily as needed. He reports he usually uses this when he is working in the yard. PDMP shows regular, time appropriate refills. He continues viagra as needed for ED. He has been out of work for the past three months due to his wife being ill. She is a quadriplegic and had skin ulcer.   01/21/2020 ALL: He returns for follow up. He reports neuropathy pain is stable. He feels that his feet feel hot from time to time but it isn't too bad on medication. He is taking lamotrigine, gabapentin and hydrocodone as prescribed. Meloxicam helps with back pain. He feels that meds work better together. 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 He denies sedative side effects. He takes hydrocodone at night only. Viagra PRN for ED. No adverse effects noted.   01/21/2019 RS: He feels the polyneuropathy is stable.   He gets a burning sensation at times but not as bad as before the medication.  Lamotrigine 150 mg helped incompletely and he has done better with the addition of gabapentin.    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  The leg pain is fairly symmetric.   He has no weakness.   Pain is worse at night and when he first wakes up and sometimes after a long day of driving.Marland Kitchen  He sometimes needs a hydrocodone when pain is bothering him more.       He denies weakness in his legs.    He does not note bladder changes but has ED, helped by Viagra.     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    Hydrocodone has helped the neuropathic pain as well as the back pain.     The PDMP was reviewed and he is compliant.     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      History of dysesthesia:     Early 2017, he had the onset of a burning sensation in his feet.  He did not note any change in the color of most of the toes though the tip of the big toe did seem to be a mildly different color. Sometimes, he gets a sensation of swelling in the big toes. Initially, he was tried on Lyrica but had stomach upset and stopped. Then, he was tried on gabapentin. 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 He does not think he was on either of these medications long enough to know where the not he got a benefit.  Lamotrigine combined with hydrocodone has helped best..         Studies :    ESR, cryoglobulins, SPEP/IEF and ANA were normal.    An NCV/EMG was more consistent with mild S1 chronic radiculopathy. There was not evidence of a large fiber polyneuropathy, though a small fiber polyneuropathy is still possible.   07/22/2019 ALL:  ODOM Good is a 72 y.o. male here today for follow up for dysesthesias and back pain. He continues lamotrigine 150mg  twice daily and gabapentin 1 in the morning and 2 at night. He also takes hydrocodone 5-325mg  1-2 times daily. Last refill 06/18/2019. He has tried  He continues to have "a little heat" in his feet. He feels that his back pain is actually better. He is waling every morning for about a mile. He tries to walk again in the evenings. He has been out of work due to perforated appendix.   He was hospitalized in 06/2019 for sepsis due to perforated appendix requiring percutaneous drainage. He is being followed by general surgery for consideration of an appendectomy. He was seen yesterday and will return for evaluation on 4/21.   HISTORY: (copied from my note on 11/19/2018)  Jeffrey Good is a 72 y.o. male here today for follow up for dysesthesias and back pain. He continues lamotrigine 150mg  twice daily and gabapentin 300mg  BID. He is prescribed 300mg  three times daily but hasn't  increased dose. He uses hydrocodone as needed for pain. Last refill 11/10/2018 for 45 tablets. He states that he usually takes 1 everyday and sometimes he takes two tablets. He feels that back pain and neuropathy are stable. He is walking about 2 miles daily for exercise. He is a [employer]. He does use Viagra on occasion for ED.  HISTORY: (copied from Dr Bonnita Hollow note on 05/20/2018)   Jaymison Densmore is a 72 yo man with burning dysesthesias and back pain.       Update 05/20/2018: He is having numbness and tingling dysesthesia in his feet.    Lamotrigine 150 mg helped incompletely.   More recently, gabapentin was started and she is on 300 mg po tid.    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  The leg pain is fairly symmetric.   Pain is worse at night and when he first wakes up and sometimes after a long day of driving.Marland Kitchen  He sometimes needs a hydrocodone when pain is bothering him more.       He denies weakness in his legs.    He does not note bladder changes but has ED, helped by Viagra.     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        Update 11/13/2017: He has numbness in the feet and mild dysesthesia.  Lamotrigine 150 mg twice daily has helped a lot.  He tolerates it well.  He denies any significant weakness.  He feels the balance and gait are doing well.  He walks up to 3 miles a day and also uses an exercise bike in bad weather.  The foot pain is usually worse at night and he takes hydrocodone with benefit.  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  Viagra has been helpful.   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   He has some low back pain and feels it is doing about the same.  Moving around helps the back pain when laying down worsens it.  He notes it for  at night.  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   Update 05/14/2017:    He feels his dysesthetic nerve pain is doing well on lamotrigine 150 mg po bid.    He tolerates it well.     He notes no worsening numbness.    He denies any change in strength.    Balance and gait are good.   He walks 3 miles and/or rides exercise bike x 45 minutes daily.      He takes hydrocodone many nights with benefit as his foot pain is worse when he lays down   His LBP is about the same .   It bothers him more at night or when laying down.   He does better with movements.  Sitting is not as bad.  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      He also has had ED since the neuropathy started.    Viagra 50-100 mg usually helps a lot.        From 11/07/2016: Dysesthetic Pain:   He has a burning pain in both feet. He feels that this is doing better on lamotrigine 150 mg twice a day that on a lower dose. There are still some days that hurt more and hydrocodone will help. Pain is worse on the bottom of the foot than the top of the foot. He does not note any weakness. He does not feel that this has worsened.  He feels he walks well. He does not have any weakness. Balance is fine.   He has not had any bladder or bowel changes. Etiology of the neuropathy is unknown. Lab work have been normal and he does not have diabetes or other systemic illnesses .       LBP:    LBP is doing better.  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  MRI shows degenerative changes at L5-S1. Nerve conduction study / EMG showed a mild S1 chronic radiculopathy.   ED:   He reports erectile dysfunction that is helped by Viagra.     History of dysesthesia:     Early 2017, he had the onset of a burning sensation in his feet.  He did not note any change in the color of most of the toes though the tip of the big toe did  seem to be a mildly different color. Sometimes, he gets a sensation of swelling in the big toes. Initially, he was tried on Lyrica but had stomach upset and stopped. Then, he was tried on gabapentin. 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 He does not think he was on either of these medications long enough to know where the not he got a benefit.  Lamotrigine combined with hydrocodone has helped best..         Studies :    ESR, cryoglobulins, SPEP/IEF and ANA were normal.    An NCV/EMG was more consistent with mild S1 chronic radiculopathy. There was not evidence of a large fiber polyneuropathy, though a small fiber polyneuropathy is still possible.     REVIEW OF SYSTEMS: Out of a complete 14 system review of symptoms, the patient complains only of the following symptoms, numbness, burning, back pain and all other reviewed systems are negative.  ALLERGIES: Allergies  Allergen Reactions   Pregabalin Nausea Only    HOME MEDICATIONS: Outpatient Medications Prior to Visit  Medication Sig Dispense Refill   brimonidine (ALPHAGAN) 0.2 % ophthalmic solution Place 1 drop into both eyes in the morning and at bedtime. 5 mL 3   carvedilol (COREG) 25 MG tablet TAKE 1 TABLET (25 MG TOTAL) BY MOUTH TWICE A DAY WITH MEALS 180 tablet 3   Cetirizine HCl 10 MG CAPS Take 10 mg by mouth daily.      doxazosin (CARDURA) 2 MG tablet TAKE 2 TABLETS BY MOUTH EVERY DAY 180 tablet 3   HYDROcodone-acetaminophen (NORCO/VICODIN) 5-325 MG tablet Take 1-2 tablets by mouth daily as needed for moderate pain. 45 tablet 0   lamoTRIgine (LAMICTAL) 150 MG tablet Take 1 tablet (150 mg total) by mouth 2 (two) times daily. 180 tablet 1   lisinopril (ZESTRIL) 20 MG tablet TAKE 1 TABLET BY MOUTH TWICE A DAY 60 tablet 0   meloxicam (MOBIC) 7.5 MG tablet Take 7.5 mg by mouth every other day.     sildenafil (VIAGRA) 100 MG tablet TAKE 1/2-1 TABLET AS NEEDED BEFORE INTERCOURSE 30 tablet 3    spironolactone (ALDACTONE) 25 MG tablet TAKE 1 TABLET BY MOUTH EVERY DAY 30 tablet 0   terbinafine (LAMISIL) 250 MG tablet Take 250 mg by mouth daily.     gabapentin (NEURONTIN) 300 MG capsule TAKE 1 CAPSULE BY MOUTH THREE TIMES A DAY 270 capsule 2   No facility-administered medications prior to visit.    PAST MEDICAL HISTORY: Past Medical History:  Diagnosis Date   Cataract    Mixed form OU   Erectile dysfunction    Hyperlipidemia    Hypertension    Hypertensive retinopathy    OU   Seasonal allergies     PAST SURGICAL HISTORY: Past Surgical History:  Procedure Laterality Date   IR RADIOLOGIST EVAL & MGMT  07/16/2019   IR RADIOLOGIST EVAL & MGMT  07/29/2019   KNEE ARTHROSCOPY     1989 left   LAPAROSCOPIC APPENDECTOMY N/A 10/15/2019   Procedure: LAPAROSCOPIC APPENDECTOMY;  Surgeon: Harriette Bouillon, MD;  Location: [facility] OR;  Service: General;  Laterality: N/A;    FAMILY HISTORY: Family History  Problem Relation Age of Onset   Hypertension Mother    Diabetes Mother     SOCIAL HISTORY: Social History   Socioeconomic History   Marital status: Married    Spouse name: Not on file   Number of children: Not on file   Years of education: Not on file   Highest education level: Not on file  Occupational History   Not on file  Tobacco Use   Smoking status: Never   Smokeless tobacco: Never  Vaping Use   Vaping status: Never Used  Substance and Sexual Activity   Alcohol use: Yes    Alcohol/week: 1.0 standard drink of alcohol    Types: 1 Cans of beer per week    Comment: very rare   Drug use: No   Sexual activity: Yes  Other Topics Concern   Not on file  Social History Narrative   Not on file   Social Determinants of Health   Financial Resource Strain: Not on file  Food Insecurity: Not on file  Transportation Needs: Not on file  Physical Activity: Not on file  Stress: Not on file  Social Connections: Not on file  Intimate Partner Violence: Not on file       PHYSICAL EXAM  Vitals:   12/12/22 1442  BP: 108/71  Pulse: 77  Weight: 214 lb 8 oz (97.3 kg)  Height: 5\' 10"  (1.778 m)    Body mass index is 30.78 kg/m.  Generalized: Well developed, in no acute distress  Cardiology: normal rate and rhythm, no murmur noted Respiratory: clear to auscultation bilaterally  Neurological examination  Mentation: Alert oriented to time, place, history taking. Follows all commands speech and language fluent Cranial nerve II-XII: Pupils were equal round reactive to light. Extraocular movements were full, visual field were full on confrontational test. Facial sensation and strength were normal. Uvula tongue midline. Head turning and shoulder shrug  were normal and symmetric. Motor: The motor testing reveals 5 over 5 strength of all 4 extremities. Good symmetric motor tone is noted throughout.  Sensory: Sensory testing is intact to soft touch on all 4 extremities. No evidence of extinction is noted.  Coordination: Cerebellar testing reveals good finger-nose-finger and heel-to-shin bilaterally.  Gait and station: Gait is normal. .   DIAGNOSTIC DATA (LABS, IMAGING, TESTING) - I reviewed patient records, labs, notes, testing and imaging myself where available.      No data to display           Lab Results  Component Value Date   WBC 5.1 10/15/2019   HGB 11.9 (L) 10/15/2019   HCT 38.6 (L) 10/15/2019   MCV 90.0 10/15/2019   PLT 158 10/15/2019      Component Value Date/Time   NA 135 10/15/2022 1635   K 4.2 10/15/2022 1635   CL 97 10/15/2022 1635   CO2  24 10/15/2022 1635   GLUCOSE 93 10/15/2022 1635   GLUCOSE 97 10/15/2019 0629   BUN 16 10/15/2022 1635   CREATININE 1.20 10/15/2022 1635   CREATININE 1.16 04/05/2016 1408   CALCIUM 9.7 10/15/2022 1635   PROT 7.3 10/15/2019 0629   PROT 7.3 11/16/2014 1635   ALBUMIN 3.6 10/15/2019 0629   AST 20 10/15/2019 0629   ALT 22 10/15/2019 0629   ALKPHOS 58 10/15/2019 0629   BILITOT 0.4  10/15/2019 0629   GFRNONAA >60 10/15/2019 0629   GFRAA >60 10/15/2019 0629   Lab Results  Component Value Date   CHOL 126 09/01/2014   HDL 34.00 (L) 09/01/2014   LDLCALC 71 09/01/2014   TRIG 103.0 09/01/2014   CHOLHDL 4 09/01/2014   No results found for: "HGBA1C" No results found for: "VITAMINB12" No results found for: "TSH"     ASSESSMENT AND PLAN 72 y.o. year old male  has a past medical history of Cataract, Erectile dysfunction, Hyperlipidemia, Hypertension, Hypertensive retinopathy, and Seasonal allergies. here with     ICD-10-CM   1. Polyneuropathy  G62.9     2. Chronic pain syndrome  G89.4     3. Erectile dysfunction, unspecified erectile dysfunction type  N52.9     4. Lumbosacral radiculopathy at S1  M54.17     5. Dysesthesia  R20.8       Mr Distasi is doing well today.  We will continue lamotrigine 150mg  BID, gabapentin 300mg  in am and 600mg  at bedtime, and hydrocodone 1-1.5 tablets daily as prescribed.  I have reviewed PDMP aware and appropriate refills have been requested. Last refill 11/20/2022. He will call when refills due. He will continue Viagra as needed. He will stay well-hydrated.  I have encouraged him to stay as active as possible.  He will follow-up with Dr. Epimenio Foot in 6 months, sooner if needed.  He verbalizes understanding and agreement with this plan.   No orders of the defined types were placed in this encounter.    Meds ordered this encounter  Medications   gabapentin (NEURONTIN) 300 MG capsule    Sig: TAKE 1 CAPSULE BY MOUTH THREE TIMES A DAY    Dispense:  270 capsule    Refill:  3    Order Specific Question:   Supervising Provider    Answer:   Anson Fret [[id number]]       JWJ XBJYN, FNP-C 12/12/2022, 3:10 PM [facility] [address] [place], [place] [address] [phone]

## 2022-12-11 NOTE — Patient Instructions (Signed)
Below is our plan:  We will continue lamotrigine 150mg  twice daily, gabapentin 300mg  three times daily and hydrocodone 1-2 tablets daily as needed. Continue Viagra as needed.   Please make sure you are staying well hydrated. I recommend 50-60 ounces daily. Well balanced diet and regular exercise encouraged. Consistent sleep schedule with 6-8 hours recommended.   Please continue follow up with care team as directed.   Follow up with Dr Epimenio Foot in 6 months   You may receive a survey regarding today's visit. I encourage you to leave honest feed back as I do use this information to improve patient care. Thank you for seeing me today!

## 2022-12-12 ENCOUNTER — Other Ambulatory Visit (INDEPENDENT_AMBULATORY_CARE_PROVIDER_SITE_OTHER): Payer: Self-pay

## 2022-12-12 ENCOUNTER — Ambulatory Visit: Payer: PRIVATE HEALTH INSURANCE | Admitting: Family Medicine

## 2022-12-12 ENCOUNTER — Other Ambulatory Visit: Payer: Self-pay

## 2022-12-12 ENCOUNTER — Encounter: Payer: Self-pay | Admitting: Family Medicine

## 2022-12-12 VITALS — BP 108/71 | HR 77 | Ht 70.0 in | Wt 214.5 lb

## 2022-12-12 DIAGNOSIS — M5417 Radiculopathy, lumbosacral region: Secondary | ICD-10-CM

## 2022-12-12 DIAGNOSIS — G894 Chronic pain syndrome: Secondary | ICD-10-CM

## 2022-12-12 DIAGNOSIS — N529 Male erectile dysfunction, unspecified: Secondary | ICD-10-CM | POA: Diagnosis not present

## 2022-12-12 DIAGNOSIS — G629 Polyneuropathy, unspecified: Secondary | ICD-10-CM | POA: Diagnosis not present

## 2022-12-12 DIAGNOSIS — R208 Other disturbances of skin sensation: Secondary | ICD-10-CM

## 2022-12-12 MED ORDER — LAMOTRIGINE 150 MG PO TABS: 150.0000 mg | ORAL_TABLET | Freq: Two times a day (BID) | ORAL | 1 refills | Status: DC

## 2022-12-12 MED ORDER — GABAPENTIN 300 MG PO CAPS: ORAL_CAPSULE | ORAL | 3 refills | Status: DC

## 2022-12-12 MED ORDER — BRIMONIDINE TARTRATE 0.2 % OP SOLN: 1.0000 [drp] | Freq: Two times a day (BID) | OPHTHALMIC | 3 refills | Status: DC

## 2022-12-24 ENCOUNTER — Other Ambulatory Visit: Payer: Self-pay | Admitting: Family Medicine

## 2022-12-24 MED ORDER — HYDROCODONE-ACETAMINOPHEN 5-325 MG PO TABS: 1.0000 | ORAL_TABLET | Freq: Every day | ORAL | 0 refills | Status: DC | PRN

## 2022-12-24 NOTE — Telephone Encounter (Signed)
Pt called wanting to know when this will be refilled for him. Please advise.

## 2022-12-24 NOTE — Telephone Encounter (Signed)
Pt. is requesting a refill for HYDROcodone-acetaminophen (NORCO/VICODIN) 5-325 MG tablet.  Pharmacy: [facility] [phone]

## 2022-12-24 NOTE — Telephone Encounter (Signed)
Last Seen 12/12/2022 Upcoming Appointment 07/03/2023  Hydrocodone Last filled 11/20/2022 Escript 12/24/2022

## 2022-12-25 ENCOUNTER — Other Ambulatory Visit: Payer: Self-pay | Admitting: Cardiovascular Disease

## 2022-12-26 NOTE — Telephone Encounter (Signed)
Sildenafil sent to pharmacy per request.

## 2022-12-26 NOTE — Telephone Encounter (Signed)
Pt's pharmacy is requesting a refill on sildenafil. Would Dr. Elease Hashimoto like to refill this medication? Please address

## 2023-01-08 ENCOUNTER — Other Ambulatory Visit: Payer: Self-pay | Admitting: Cardiovascular Disease

## 2023-01-22 ENCOUNTER — Other Ambulatory Visit: Payer: Self-pay | Admitting: Family Medicine

## 2023-01-22 MED ORDER — HYDROCODONE-ACETAMINOPHEN 5-325 MG PO TABS: 1.0000 | ORAL_TABLET | Freq: Every day | ORAL | 0 refills | Status: DC | PRN

## 2023-01-22 NOTE — Telephone Encounter (Signed)
Last seen on 12/12/22 Follow up scheduled on 07/03/23 Last filled on 12/25/22 #45 (30 day supply) Rx pending to be signed

## 2023-01-22 NOTE — Telephone Encounter (Signed)
Pt is needing a refill on his HYDROcodone-acetaminophen (NORCO/VICODIN) 5-325 MG tablet and is needing it sent to the [facility] on [address].

## 2023-02-22 ENCOUNTER — Other Ambulatory Visit: Payer: Self-pay | Admitting: Family Medicine

## 2023-02-22 NOTE — Telephone Encounter (Signed)
Pt called needing a refill on his HYDROcodone-acetaminophen (NORCO/VICODIN) 5-325 MG tablet and is needing it sent to the [facility] on [address]

## 2023-02-25 MED ORDER — HYDROCODONE-ACETAMINOPHEN 5-325 MG PO TABS: 1.0000 | ORAL_TABLET | Freq: Every day | ORAL | 0 refills | Status: DC | PRN

## 2023-02-25 NOTE — Telephone Encounter (Signed)
Last seen 12/12/22 and next f/u 07/03/23. Last refilled 01/22/23 #45.

## 2023-03-15 ENCOUNTER — Other Ambulatory Visit: Payer: Self-pay | Admitting: Neurology

## 2023-03-18 NOTE — Telephone Encounter (Signed)
Last seen on 12/12/22 Follow up scheduled on 07/03/23

## 2023-03-25 ENCOUNTER — Other Ambulatory Visit: Payer: Self-pay | Admitting: Family Medicine

## 2023-03-25 MED ORDER — HYDROCODONE-ACETAMINOPHEN 5-325 MG PO TABS: 1.0000 | ORAL_TABLET | Freq: Every day | ORAL | 0 refills | Status: DC | PRN

## 2023-03-25 NOTE — Telephone Encounter (Signed)
Pt request refill for HYDROcodone-acetaminophen (NORCO/VICODIN) 5-325 MG tablet [facility] [phone]

## 2023-03-25 NOTE — Telephone Encounter (Signed)
Last seen 12-12-2022, next appt 07-03-2023, last fill 02-25-2023 #45,

## 2023-04-12 ENCOUNTER — Other Ambulatory Visit (INDEPENDENT_AMBULATORY_CARE_PROVIDER_SITE_OTHER): Payer: Self-pay | Admitting: Ophthalmology

## 2023-04-23 ENCOUNTER — Other Ambulatory Visit: Payer: Self-pay | Admitting: Family Medicine

## 2023-04-23 MED ORDER — HYDROCODONE-ACETAMINOPHEN 5-325 MG PO TABS: 1.0000 | ORAL_TABLET | Freq: Every day | ORAL | 0 refills | Status: DC | PRN

## 2023-04-23 NOTE — Telephone Encounter (Signed)
Pt is needing a refill on his HYDROcodone-acetaminophen (NORCO/VICODIN) 5-325 MG tablet and is needing it sent to the [facility] on [address].

## 2023-04-23 NOTE — Telephone Encounter (Signed)
 Last seen 12/12/22 and next f/u 07/03/23. Last refilled 03/25/23 #45.

## 2023-05-22 NOTE — Progress Notes (Addendum)
 [facility] - Clinic Note  06/04/2023     CHIEF COMPLAINT Patient presents for Retina Follow Up   HISTORY OF PRESENT ILLNESS: Jeffrey Good is a 73 y.o. male who presents to the clinic today for:   HPI     Retina Follow Up   In right eye.  This started 9 months ago.  I, the attending physician,  performed the HPI with the patient and updated documentation appropriately.        Comments   Patient here for 9 months retina follow up for BRVO OD. Patient states vision doing pretty good. Still got the floaters. No eye pain. Using drops.       Last edited by Rennis Chris, MD on 06/04/2023  4:06 PM.    Pt states vision is stable, no new health problems  Referring physician: Conley Rolls [place], [place] [address] [place],  [place] [address]  HISTORICAL INFORMATION:   Selected notes from the MEDICAL RECORD NUMBER Referred by Dr. Conley Rolls for eval of floaters/ischemia/heme OU.   CURRENT MEDICATIONS: Current Outpatient Medications (Ophthalmic Drugs)  Medication Sig   brimonidine (ALPHAGAN) 0.2 % ophthalmic solution PLACE 1 DROP INTO BOTH EYES IN THE MORNING AND AT BEDTIME.   No current facility-administered medications for this visit. (Ophthalmic Drugs)   Current Outpatient Medications (Other)  Medication Sig   carvedilol (COREG) 25 MG tablet TAKE 1 TABLET (25 MG TOTAL) BY MOUTH TWICE A DAY WITH MEALS   Cetirizine HCl 10 MG CAPS Take 10 mg by mouth daily.    doxazosin (CARDURA) 2 MG tablet TAKE 2 TABLETS BY MOUTH EVERY DAY   gabapentin (NEURONTIN) 300 MG capsule TAKE 1 CAPSULE BY MOUTH THREE TIMES A DAY   HYDROcodone-acetaminophen (NORCO/VICODIN) 5-325 MG tablet Take 1-2 tablets by mouth daily as needed for moderate pain (pain score 4-6).   lamoTRIgine (LAMICTAL) 150 MG tablet TAKE 1 TABLET BY MOUTH TWICE A DAY   lisinopril (ZESTRIL) 20 MG tablet TAKE 1 TABLET BY MOUTH TWICE A DAY   meloxicam (MOBIC) 7.5 MG tablet Take 7.5 mg by mouth every other day.    sildenafil (VIAGRA) 100 MG tablet TAKE 1/2-1 TABLET AS NEEDED BEFORE INTERCOURSE   spironolactone (ALDACTONE) 25 MG tablet TAKE 1 TABLET BY MOUTH EVERY DAY   terbinafine (LAMISIL) 250 MG tablet Take 250 mg by mouth daily.   No current facility-administered medications for this visit. (Other)   REVIEW OF SYSTEMS: ROS   Positive for: Musculoskeletal, Eyes, Allergic/Imm Negative for: Constitutional, Gastrointestinal, Neurological, Skin, Genitourinary, HENT, Endocrine, Cardiovascular, Respiratory, Psychiatric, Heme/Lymph Last edited by Laddie Aquas, COA on 06/04/2023  2:57 PM.     ALLERGIES Allergies  Allergen Reactions   Pregabalin Nausea Only   PAST MEDICAL HISTORY Past Medical History:  Diagnosis Date   Cataract    Mixed form OU   Erectile dysfunction    Hyperlipidemia    Hypertension    Hypertensive retinopathy    OU   Seasonal allergies    Past Surgical History:  Procedure Laterality Date   IR RADIOLOGIST EVAL & MGMT  07/16/2019   IR RADIOLOGIST EVAL & MGMT  07/29/2019   KNEE ARTHROSCOPY     1989 left   LAPAROSCOPIC APPENDECTOMY N/A 10/15/2019   Procedure: LAPAROSCOPIC APPENDECTOMY;  Surgeon: Harriette Bouillon, MD;  Location: [facility] OR;  Service: General;  Laterality: N/A;   FAMILY HISTORY Family History  Problem Relation Age of Onset   Hypertension Mother    Diabetes Mother  SOCIAL HISTORY Social History   Tobacco Use   Smoking status: Never   Smokeless tobacco: Never  Vaping Use   Vaping status: Never Used  Substance Use Topics   Alcohol use: Yes    Alcohol/week: 1.0 standard drink of alcohol    Types: 1 Cans of beer per week    Comment: very rare   Drug use: No       OPHTHALMIC EXAM:  Base Eye Exam     Visual Acuity (Snellen - Linear)       Right Left   Dist [place] 20/25 -1 20/25 +1   Dist ph [place] 20/20 20/20         Tonometry (Tonopen, 2:54 PM)       Right Left   Pressure 18 09         Pupils       Dark Light Shape React APD   Right 3 2  Round Brisk None   Left 3 2 Round Brisk None         Visual Fields (Counting fingers)       Left Right    Full Full         Extraocular Movement       Right Left    Full, Ortho Full, Ortho         Neuro/Psych     Oriented x3: Yes   Mood/Affect: Normal         Dilation     Both eyes: 1.0% Mydriacyl, 2.5% Phenylephrine @ 2:54 PM           Slit Lamp and Fundus Exam     Slit Lamp Exam       Right Left   Lids/Lashes Dermatochalasis - upper lid Dermatochalasis - upper lid   Conjunctiva/Sclera mild Melanosis nasal and temporal pinguecula, mild melanosis   Cornea arcus, trace tear film debris arcus, trace tear film debris   Anterior Chamber deep and clear deep and clear   Iris Round and dilated Round and dilated   Lens 2-3+ Nuclear sclerosis, 2-3+ Cortical cataract 2-3+ Nuclear sclerosis, 2-3+ Cortical cataract   Anterior Vitreous Vitreous syneresis, Posterior vitreous detachment, vitreous condensations Vitreous syneresis         Fundus Exam       Right Left   Disc Pink and Sharp, Compact, corkscrew vessel nasally, +PPP Pink and Sharp, Compact, focal temporal PPP   C/D Ratio 0.2 0.2   Macula Flat, Good foveal reflex, mild RPE mottling, No heme or edema Flat, Good foveal reflex, mild RPE mottling, No heme or edema   Vessels attenuated, Tortuous, mild Copper wiring, mild sheathing of SN arterioles, +Telangiectasia superior to disc attenuated, mild tortuosity, mild copper wiring   Periphery Attached, peripheral drusen, sclerotic vessels superonasal periphery -- good segmental PRP laser changes; +focal blot hemes and punctate exudates SN quad - stably resolved, No RT/RD, No heme Attached, focal punctate CR atrophy superior to disc, No heme           Refraction     Wearing Rx       Sphere Cylinder Axis Add   Right +0.25 +1.25 125 +2.75   Left -0.25 +1.50 075 +2.75            IMAGING AND PROCEDURES  Imaging and Procedures for 06/04/2023  OCT,  Retina - OU - Both Eyes       Right Eye Quality was good. Central Foveal Thickness: 273. Progression has been stable. Findings include normal foveal  contour, no IRF, no SRF (stable improvement in vitreous opacities).   Left Eye Quality was good. Central Foveal Thickness: 266. Progression has been stable. Findings include normal foveal contour, no IRF, no SRF, vitreomacular adhesion .   Notes *Images captured and stored on drive  Diagnosis / Impression:  NFP, no IRF/SRF OU OD: interval improvement in vit opacities  Clinical management:  See below  Abbreviations: NFP - Normal foveal profile. CME - cystoid macular edema. PED - pigment epithelial detachment. IRF - intraretinal fluid. SRF - subretinal fluid. EZ - ellipsoid zone. ERM - epiretinal membrane. ORA - outer retinal atrophy. ORT - outer retinal tubulation. SRHM - subretinal hyper-reflective material. IRHM - intraretinal hyper-reflective material      Fluorescein Angiography Optos (Transit OD)       Right Eye Progression has been stable. Early phase findings include delayed filling, staining, vascular perfusion defect (Delayed venous return, telangectasia's). Mid/Late phase findings include staining, vascular perfusion defect (+Telangectasias, staining of segmental laser SN quad, stable improvement in MA's and leakage, no NV).   Left Eye Progression has been stable. Early phase findings include microaneurysm. Mid/Late phase findings include microaneurysm.   Notes **Images stored on drive**  Impression: OD:+Telangectasias, staining of segmental laser SN quad, stable improvement in MAs and leakage, no NV  -- remote BRVO OS: +MAs             ASSESSMENT/PLAN:   ICD-10-CM   1. 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 Retinal ischemia  H35.82     3. Proliferative retinopathy of right eye  H35.21     4. Posterior  vitreous detachment of right eye  H43.811     5. Essential hypertension  I10     6. Hypertensive retinopathy of both eyes  H35.033     7. Combined forms of age-related cataract of both eyes  H25.813     8. Bilateral ocular hypertension  H40.053      1-3. Retinal Ischemia, remote BRVO w/ proliferative retinopathy OD  - exam shows sclerotic vessels in SN peripheral quad  - FA 10.19.21 shows telangectasias w/ late leakage, vascular perfusion defects in superonasal peripheral quad w/ early NV-- suggestive of remote BRVO   - optic disc with nasal corkscrew vessel   - s/p segmental PRP OD (12.08.21) to areas of sclerotic vessels SN periphery -- good laser changes in place  - repeat FA 5.13.22 shows interval improvement in MA and leakage SN peripheral quad  - repeat FA 02.25.25 shows stable improvement in MA and leakage SN quad, no NV  - no retinal or ophthalmic interventions indicated or recommended   - f/u 1 yr -- DFE, OCT  4. PVD / vitreous syneresis OD  - history of floaters OD, onset ~1st of Oct 2021 -- improved  - Discussed findings and prognosis  - No RT or RD on 360 peripheral exam  - Reviewed s/s of RT/RD  - Strict return precautions for any such RT/RD signs/symptoms  5,6. Hypertensive retinopathy OU - discussed importance of tight BP control - monitor  7. Mixed Cataract OU - The symptoms of cataract, surgical options, and treatments and risks were discussed with patient. - discussed diagnosis and progression - will refer to [facility] for consult  8. Ocular Hypertension OU  - IOP today: 18, 9  - continue brimonidine BID   - monitor  Ophthalmic  Meds Ordered this visit:  No orders of the defined types were placed in this encounter.    Return in about 1 year (around 06/03/2024) for f/u BRVO OD, DFE, OCT.  There are no Patient Instructions on file for this visit.  This document serves as a record of services personally performed by Karie Chimera, MD, PhD. It was  created on their behalf by Charlette Caffey, COT an ophthalmic technician. The creation of this record is the provider's dictation and/or activities during the visit.    Electronically signed by:  Charlette Caffey, COT  06/04/23 4:12 PM  This document serves as a record of services personally performed by Karie Chimera, MD, PhD. It was created on their behalf by Glee Arvin. Manson Passey, OA an ophthalmic technician. The creation of this record is the provider's dictation and/or activities during the visit.    Electronically signed by: Glee Arvin. Manson Passey, OA 06/04/23 4:12 PM  Karie Chimera, M.D., Ph.D. Diseases & Surgery of the Retina and Vitreous [facility]  I have reviewed the above documentation for accuracy and completeness, and I agree with the above. Karie Chimera, M.D., Ph.D. 06/04/23 4:12 PM   Abbreviations: M myopia (nearsighted); A astigmatism; H hyperopia (farsighted); P presbyopia; Mrx spectacle prescription;  CTL contact lenses; OD right eye; OS left eye; OU both eyes  XT exotropia; ET esotropia; PEK punctate epithelial keratitis; PEE punctate epithelial erosions; DES dry eye syndrome; MGD meibomian gland dysfunction; ATs artificial tears; PFAT's preservative free artificial tears; NSC nuclear sclerotic cataract; PSC posterior subcapsular cataract; ERM epi-retinal membrane; PVD posterior vitreous detachment; RD retinal detachment; DM diabetes mellitus; DR diabetic retinopathy; NPDR non-proliferative diabetic retinopathy; PDR proliferative diabetic retinopathy; CSME clinically significant macular edema; DME diabetic macular edema; dbh dot blot hemorrhages; CWS cotton wool spot; POAG primary open angle glaucoma; C/D cup-to-disc ratio; HVF humphrey visual field; GVF goldmann visual field; OCT optical coherence tomography; IOP intraocular pressure; BRVO Branch retinal vein occlusion; CRVO central retinal vein occlusion; CRAO central retinal artery occlusion; BRAO branch  retinal artery occlusion; RT retinal tear; SB scleral buckle; PPV pars plana vitrectomy; VH Vitreous hemorrhage; PRP panretinal laser photocoagulation; IVK intravitreal kenalog; VMT vitreomacular traction; MH Macular hole;  NVD neovascularization of the disc; NVE neovascularization elsewhere; AREDS age related eye disease study; ARMD age related macular degeneration; POAG primary open angle glaucoma; EBMD epithelial/anterior basement membrane dystrophy; ACIOL anterior chamber intraocular lens; IOL intraocular lens; PCIOL posterior chamber intraocular lens; Phaco/IOL phacoemulsification with intraocular lens placement; PRK photorefractive keratectomy; LASIK laser assisted in situ keratomileusis; HTN hypertension; DM diabetes mellitus; COPD chronic obstructive pulmonary disease

## 2023-05-27 ENCOUNTER — Other Ambulatory Visit: Payer: Self-pay | Admitting: Family Medicine

## 2023-05-27 MED ORDER — HYDROCODONE-ACETAMINOPHEN 5-325 MG PO TABS
1.0000 | ORAL_TABLET | Freq: Every day | ORAL | 0 refills | Status: DC | PRN
Start: 2023-05-27 — End: 2023-06-25

## 2023-05-27 NOTE — Telephone Encounter (Signed)
Pt request refill for HYDROcodone-acetaminophen (NORCO/VICODIN) 5-325 MG tablet [facility] to [id number]

## 2023-05-27 NOTE — Telephone Encounter (Signed)
Last seen 12/12/22 and next f/u 07/03/23. Last refilled 04/23/23 #45.

## 2023-06-04 ENCOUNTER — Encounter (INDEPENDENT_AMBULATORY_CARE_PROVIDER_SITE_OTHER): Payer: Self-pay | Admitting: Ophthalmology

## 2023-06-04 ENCOUNTER — Ambulatory Visit (INDEPENDENT_AMBULATORY_CARE_PROVIDER_SITE_OTHER): Payer: PRIVATE HEALTH INSURANCE | Admitting: Ophthalmology

## 2023-06-04 VITALS — BP 128/74 | HR 72

## 2023-06-04 DIAGNOSIS — H43811 Vitreous degeneration, right eye: Secondary | ICD-10-CM | POA: Diagnosis not present

## 2023-06-04 DIAGNOSIS — H3582 Retinal ischemia: Secondary | ICD-10-CM | POA: Diagnosis not present

## 2023-06-04 DIAGNOSIS — H3521 Other non-diabetic proliferative retinopathy, right eye: Secondary | ICD-10-CM | POA: Diagnosis not present

## 2023-06-04 DIAGNOSIS — I10 Essential (primary) hypertension: Secondary | ICD-10-CM

## 2023-06-04 DIAGNOSIS — H348312 Tributary (branch) retinal vein occlusion, right eye, stable: Secondary | ICD-10-CM

## 2023-06-04 DIAGNOSIS — H40053 Ocular hypertension, bilateral: Secondary | ICD-10-CM

## 2023-06-04 DIAGNOSIS — H25813 Combined forms of age-related cataract, bilateral: Secondary | ICD-10-CM

## 2023-06-04 DIAGNOSIS — H35033 Hypertensive retinopathy, bilateral: Secondary | ICD-10-CM

## 2023-06-24 DIAGNOSIS — M1712 Unilateral primary osteoarthritis, left knee: Secondary | ICD-10-CM | POA: Insufficient documentation

## 2023-06-24 DIAGNOSIS — M25562 Pain in left knee: Secondary | ICD-10-CM | POA: Insufficient documentation

## 2023-06-25 ENCOUNTER — Other Ambulatory Visit: Payer: Self-pay | Admitting: Family Medicine

## 2023-06-25 MED ORDER — HYDROCODONE-ACETAMINOPHEN 5-325 MG PO TABS
1.0000 | ORAL_TABLET | Freq: Every day | ORAL | 0 refills | Status: DC | PRN
Start: 2023-06-25 — End: 2023-07-25

## 2023-06-25 NOTE — Telephone Encounter (Signed)
 Last seen 12/12/22, next appt 07/03/23 Dispenses   Dispensed Days Supply Quantity Provider Pharmacy  HYDROCODONE-ACETAMIN 5-325 MG 05/27/2023 30 45 each Sater, Pearletha Furl, MD [facility] [phone] - G...  HYDROCODONE-ACETAMIN 5-325 MG 04/23/2023 22 45 each Sater, Pearletha Furl, MD [facility] [phone] - G...  HYDROCODONE-ACETAMIN 5-325 MG 03/25/2023 30 45 each Sater, Pearletha Furl, MD [facility] [phone] - G...  HYDROCODONE-ACETAMIN 5-325 MG 02/25/2023 22 45 each Sater, Pearletha Furl, MD [facility] [phone] - G...  HYDROCODONE-ACETAMIN 5-325 MG 01/22/2023 23 45 each Sater, Pearletha Furl, MD [facility] [phone] - G...  HYDROCOD/ACETAM 5-325MG  TAB 12/25/2022 30 45 each Dohmeier, Porfirio Mylar, MD [facility] [phone] ...  HYDROCOD/ACETAM 5-325MG  TAB 11/20/2022 23 45 each Sater, Pearletha Furl, MD [facility] [phone] ...  HYDROCODONE-ACETAMIN 5-325 MG 10/15/2022 22 45 each Sater, Pearletha Furl, MD [facility] [phone] - G...  HYDROCODONE-ACETAMIN 5-325 MG 09/13/2022 23 45 each Sater, Pearletha Furl, MD [facility] [phone] - G...  HYDROCODONE-ACETAMIN 5-325 MG 08/13/2022 23 45 each Sater, Pearletha Furl, MD [facility] [phone] - G...  HYDROCODONE-ACETAMIN 5-325 MG 07/10/2022 22 45 each Sater, Pearletha Furl, MD [facility] [phone] - G.Marland KitchenMarland Kitchen

## 2023-06-25 NOTE — Telephone Encounter (Signed)
 Pt called needing a refill on his HYDROcodone-acetaminophen (NORCO/VICODIN) 5-325 MG tablet and [facility] [address]

## 2023-07-03 ENCOUNTER — Ambulatory Visit: Payer: PRIVATE HEALTH INSURANCE | Admitting: Neurology

## 2023-07-03 ENCOUNTER — Encounter: Payer: Self-pay | Admitting: Neurology

## 2023-07-09 ENCOUNTER — Telehealth: Payer: Self-pay | Admitting: Family Medicine

## 2023-07-09 NOTE — Telephone Encounter (Signed)
 Pt rescheduled no show appt

## 2023-07-25 ENCOUNTER — Other Ambulatory Visit: Payer: Self-pay | Admitting: Family Medicine

## 2023-07-25 MED ORDER — HYDROCODONE-ACETAMINOPHEN 5-325 MG PO TABS
1.0000 | ORAL_TABLET | Freq: Every day | ORAL | 0 refills | Status: DC | PRN
Start: 2023-07-25 — End: 2023-08-26

## 2023-07-25 NOTE — Telephone Encounter (Signed)
 Last seen 12/12/22 and next f/u 11/19/23. Last refilled 06/25/23 #45.

## 2023-07-25 NOTE — Telephone Encounter (Signed)
Pt is requesting a refill for HYDROcodone-acetaminophen (NORCO/VICODIN) 5-325 MG tablet .  Pharmacy: [id number]

## 2023-08-26 ENCOUNTER — Other Ambulatory Visit: Payer: Self-pay | Admitting: Neurology

## 2023-08-26 MED ORDER — HYDROCODONE-ACETAMINOPHEN 5-325 MG PO TABS: 1.0000 | ORAL_TABLET | Freq: Every day | ORAL | 0 refills | Status: DC | PRN

## 2023-08-26 NOTE — Telephone Encounter (Signed)
 Last seen 12/12/22 Follow up scheduled on 11/19/23   Dispensed Days Supply Quantity Provider Pharmacy  HYDROCODONE -ACETAMIN 5-325 MG 07/25/2023 30 45 each Sater, Sherida Dimmer, MD [facility] [phone] - G...     Rx pending to be signed

## 2023-08-26 NOTE — Telephone Encounter (Signed)
 Pt called to request Medication  HYDROcodone -acetaminophen  (NORCO/VICODIN) 5-325 MG tablet   Medication is to be sent to :  [id number] - [place], [place] - [address] (Ph: [phone])

## 2023-08-27 ENCOUNTER — Other Ambulatory Visit: Payer: Self-pay | Admitting: Cardiovascular Disease

## 2023-09-19 ENCOUNTER — Encounter: Payer: Self-pay | Admitting: Cardiovascular Disease

## 2023-09-23 ENCOUNTER — Other Ambulatory Visit: Payer: Self-pay | Admitting: Neurology

## 2023-09-23 MED ORDER — HYDROCODONE-ACETAMINOPHEN 5-325 MG PO TABS: 1.0000 | ORAL_TABLET | Freq: Every day | ORAL | 0 refills | Status: DC | PRN

## 2023-09-23 NOTE — Telephone Encounter (Signed)
 Pt called to request medication refill  HYDROcodone -acetaminophen  (NORCO/VICODIN) 5-325 MG tablet   Pt would like  medication sent to    [id number] - [place], [place] - [address] (Ph: [phone])

## 2023-09-23 NOTE — Telephone Encounter (Signed)
 Last seen 12/12/22 and next f/u 11/19/23. Last refilled 08/26/23 #45.

## 2023-09-27 ENCOUNTER — Other Ambulatory Visit: Payer: Self-pay | Admitting: Neurology

## 2023-10-23 ENCOUNTER — Other Ambulatory Visit: Payer: Self-pay | Admitting: Family Medicine

## 2023-10-23 MED ORDER — HYDROCODONE-ACETAMINOPHEN 5-325 MG PO TABS: 1.0000 | ORAL_TABLET | Freq: Every day | ORAL | 0 refills | Status: DC | PRN

## 2023-10-23 NOTE — Telephone Encounter (Signed)
Pt is requesting a refill for HYDROcodone-acetaminophen (NORCO/VICODIN) 5-325 MG tablet .  Pharmacy: [id number]

## 2023-10-23 NOTE — Telephone Encounter (Signed)
 Last seen on 12/12/23 Follow up scheduled on 11/19/23   Dispensed Days Supply Quantity Provider Pharmacy  HYDROCODONE -ACETAMIN 5-325 MG 09/25/2023 30 45 each Sater, Charlie LABOR, MD [facility] [phone] - G...   Rx pending to be signed

## 2023-11-07 ENCOUNTER — Telehealth: Payer: Self-pay | Admitting: Internal Medicine

## 2023-11-07 NOTE — Telephone Encounter (Signed)
*  STAT* If patient is at the pharmacy, call can be transferred to refill team.   1. Which medications need to be refilled? (please list name of each medication and dose if known)  lisinopril  (ZESTRIL ) 20 MG tablet    2. Would you like to learn more about the convenience, safety, & potential cost savings by using the [facility]?     3. Are you open to using the [facility] (Type [facility].  ).   4. Which pharmacy/location (including street and city if local pharmacy) is medication to be sent to? [id number] - , [place] - [address]    5. Do they need a 30 day or 90 day supply? 90 day

## 2023-11-08 MED ORDER — LISINOPRIL 20 MG PO TABS: 20.0000 mg | ORAL_TABLET | Freq: Two times a day (BID) | ORAL | 0 refills | Status: DC

## 2023-11-19 ENCOUNTER — Encounter: Payer: Self-pay | Admitting: Neurology

## 2023-11-19 ENCOUNTER — Ambulatory Visit: Payer: PRIVATE HEALTH INSURANCE | Admitting: Neurology

## 2023-11-19 VITALS — BP 126/74 | HR 68 | Ht 70.0 in | Wt 220.5 lb

## 2023-11-19 DIAGNOSIS — R208 Other disturbances of skin sensation: Secondary | ICD-10-CM | POA: Diagnosis not present

## 2023-11-19 DIAGNOSIS — M5417 Radiculopathy, lumbosacral region: Secondary | ICD-10-CM

## 2023-11-19 DIAGNOSIS — G894 Chronic pain syndrome: Secondary | ICD-10-CM | POA: Diagnosis not present

## 2023-11-19 DIAGNOSIS — G629 Polyneuropathy, unspecified: Secondary | ICD-10-CM

## 2023-11-19 DIAGNOSIS — N529 Male erectile dysfunction, unspecified: Secondary | ICD-10-CM

## 2023-11-19 MED ORDER — HYDROCODONE-ACETAMINOPHEN 5-325 MG PO TABS: 1.0000 | ORAL_TABLET | Freq: Every day | ORAL | 0 refills | Status: DC | PRN

## 2023-11-19 NOTE — Progress Notes (Signed)
 [facility]  PATIENT: Jeffrey Good DOB: 01/01/1951  REFERRING DOCTOR OR PCP:  Vyvyan Good SOURCE: patient, records from Dr. Austin  _________________________________   HISTORICAL  CHIEF COMPLAINT:  Chief Complaint  Patient presents with   RM10/Polyneuropathy    Pt is here Alone. Pt states that things have been pretty good with his polyneuropathy. Pt states that his burning sensation has decreased tremendously.     HISTORY OF PRESENT ILLNESS:  Jeffrey Good is a 73 y.o. man with burning dysesthesias and back pain.      Update 11/19/2023: He feels the polyneuropathy is stable and pain is adequately controlled.     He reports a burning sensation that is usually mild intensity and only in  toes, right > left.  This is often more noticeable if he lays down.    Lamotrigine  150 mg and gabapentin  have helped the pain.  He will take a hydrocodone  1-2 a day if pain is more severe.  The l pain is fairly symmetric.  He denies weakness.Jeffrey Good     He denies numbness in the hands or groin.    He has left knee pain and has seen Orthopedics and will be getting a TKR.Jeffrey Good    He does not note bladder changes but has ED, helped by Viagra .       He notes LBP, especially after a longer truck ride.    Hydrocodone  has helped the neuropathic pain as well as the back pain.     The PDMP was reviewed and he is compliant.  We will renew.  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      History of dysesthesia:     Early 2017, he had the onset of a burning sensation in his feet.  He did not note any change in the color of most of the toes though the tip of the big toe did seem to be a mildly different color. Sometimes, he gets a sensation of swelling in the big toes. Initially, he was tried on Lyrica but had stomach upset and stopped. Then, he was tried on gabapentin . 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  He does not think he was on either of these medications long enough to know where the not he got a benefit.  Lamotrigine  combined with hydrocodone  has helped best..        Studies :    ESR, cryoglobulins, SPEP/IEF and ANA were normal.    An NCV/EMG was more consistent with mild S1 chronic radiculopathy. There was not evidence of a large fiber polyneuropathy, though a small fiber polyneuropathy is still possible.   REVIEW OF SYSTEMS: Constitutional: No fevers, chills, sweats, or change in appetite Eyes: No visual changes, double vision, eye pain Ear, nose and throat: No hearing loss, ear pain, nasal congestion, sore throat Cardiovascular: No chest pain, palpitations Respiratory:  No shortness of breath at rest or with exertion.   No wheezes GastrointestinaI: No nausea, vomiting, diarrhea, abdominal pain, fecal incontinence Genitourinary:  No dysuria, urinary retention or frequency.  No nocturia.  He has mild ED. Musculoskeletal:  No neck pain, back pain Integumentary: No rash, pruritus, skin lesions Neurological: as above Psychiatric: No depression at this time.  No anxiety Endocrine: No palpitations, diaphoresis, change in appetite, change in weigh or increased thirst Hematologic/Lymphatic:  No anemia, purpura, petechiae.   ALLERGIES: Allergies  Allergen Reactions   Pregabalin Nausea Only  HOME MEDICATIONS:  Current Outpatient Medications:    brimonidine  (ALPHAGAN ) 0.2 % ophthalmic solution, PLACE 1 DROP INTO BOTH EYES IN THE MORNING AND AT BEDTIME., Disp: 5 mL, Rfl: 3   carvedilol  (COREG ) 25 MG tablet, TAKE 1 TABLET (25 MG TOTAL) BY MOUTH TWICE A DAY WITH MEALS, Disp: 180 tablet, Rfl: 3   Cetirizine HCl 10 MG CAPS, Take 10 mg by mouth daily. , Disp: , Rfl:    doxazosin  (CARDURA ) 2 MG tablet, TAKE 2 TABLETS BY MOUTH EVERY DAY, Disp: 180 tablet, Rfl: 3   gabapentin  (NEURONTIN ) 300 MG capsule, TAKE 1 CAPSULE BY MOUTH THREE TIMES A DAY, Disp: 270 capsule, Rfl: 3   lamoTRIgine   (LAMICTAL ) 150 MG tablet, TAKE 1 TABLET BY MOUTH TWICE A DAY, Disp: 180 tablet, Rfl: 1   lisinopril  (ZESTRIL ) 20 MG tablet, Take 1 tablet (20 mg total) by mouth 2 (two) times daily., Disp: 45 tablet, Rfl: 0   meloxicam  (MOBIC ) 7.5 MG tablet, Take 7.5 mg by mouth every other day., Disp: , Rfl:    sildenafil  (VIAGRA ) 100 MG tablet, TAKE 1/2-1 TABLET AS NEEDED BEFORE INTERCOURSE, Disp: 15 tablet, Rfl: 11   spironolactone  (ALDACTONE ) 25 MG tablet, TAKE 1 TABLET BY MOUTH EVERY DAY, Disp: 90 tablet, Rfl: 3   terbinafine (LAMISIL) 250 MG tablet, Take 250 mg by mouth daily., Disp: , Rfl:    HYDROcodone -acetaminophen  (NORCO/VICODIN) 5-325 MG tablet, Take 1-2 tablets by mouth daily as needed for moderate pain (pain score 4-6)., Disp: 45 tablet, Rfl: 0  PAST MEDICAL HISTORY: Past Medical History:  Diagnosis Date   Cataract    Mixed form OU   Erectile dysfunction    Hyperlipidemia    Hypertension    Hypertensive retinopathy    OU   Seasonal allergies     PAST SURGICAL HISTORY: Past Surgical History:  Procedure Laterality Date   IR RADIOLOGIST EVAL & MGMT  07/16/2019   IR RADIOLOGIST EVAL & MGMT  07/29/2019   KNEE ARTHROSCOPY     1989 left   LAPAROSCOPIC APPENDECTOMY N/A 10/15/2019   Procedure: LAPAROSCOPIC APPENDECTOMY;  Surgeon: Vanderbilt Ned, MD;  Location: [facility] OR;  Service: General;  Laterality: N/A;    FAMILY HISTORY: Family History  Problem Relation Age of Onset   Hypertension Mother    Diabetes Mother     SOCIAL HISTORY:  Social History   Socioeconomic History   Marital status: Married    Spouse name: Not on file   Number of children: Not on file   Years of education: Not on file   Highest education level: Not on file  Occupational History   Not on file  Tobacco Use   Smoking status: Never   Smokeless tobacco: Never  Vaping Use   Vaping status: Never Used  Substance and Sexual Activity   Alcohol use: Yes    Alcohol/week: 1.0 standard drink of alcohol    Types: 1 Cans  of beer per week    Comment: very rare   Drug use: No   Sexual activity: Yes  Other Topics Concern   Not on file  Social History Narrative   Not on file   Social Drivers of Health   Financial Resource Strain: Not on file  Food Insecurity: Not on file  Transportation Needs: Not on file  Physical Activity: Not on file  Stress: Not on file  Social Connections: Not on file  Intimate Partner Violence: Not on file     PHYSICAL EXAM  Vitals:   11/19/23 9182  BP: 126/74  Pulse: 68  SpO2: 98%  Weight: 220 lb 8 oz (100 kg)  Height: 5' 10 (1.778 m)    Body mass index is 31.64 kg/m.   General: The patient is well-developed and well-nourished and in no acute distress.   Fingernail changes on right hand  Back:   The back is nontender. Range of motion is slightly reduced.  Neurologic Exam  Mental status: The patient is alert and oriented x 3 at the time of the examination. The patient has apparent normal recent and remote memory, with an apparently normal attention span and concentration ability.   Speech is normal.  Cranial nerves: Extraocular movements are full.  Facial strength was normal.  Hearing appeared to be normal and symmetric.  Motor:  Muscle bulk is normal.   Muscle tone is normal.  Strength was 5/5 in the arms and legs including the feet.  Sensory:  He has intact sensation to touch and temperature and vibration in the arms.  He had mild reduced vibration sense and pinprick sense at the toes compared to the ankles which were the same as the knees.    Slight dysesthetic sensation to touch in toes  Coordination: Cerebellar testing reveals good finger-nose-finger and heel-to-shin bilaterally.  Gait and station: Station is normal.  Gait is arthritic (left knee is wrapped)..  Tandem is slightly wide    Romberg is  negative.   Reflexes: Deep tendon reflexes are symmetric and normal bilaterally.      DIAGNOSTIC DATA (LABS, IMAGING, TESTING) - I reviewed patient  records, labs, notes, testing and imaging myself where available.  Lab Results  Component Value Date   WBC 5.1 10/15/2019   HGB 11.9 (L) 10/15/2019   HCT 38.6 (L) 10/15/2019   MCV 90.0 10/15/2019   PLT 158 10/15/2019      Component Value Date/Time   NA 135 10/15/2022 1635   K 4.2 10/15/2022 1635   CL 97 10/15/2022 1635   CO2 24 10/15/2022 1635   GLUCOSE 93 10/15/2022 1635   GLUCOSE 97 10/15/2019 0629   BUN 16 10/15/2022 1635   CREATININE 1.20 10/15/2022 1635   CREATININE 1.16 04/05/2016 1408   CALCIUM  9.7 10/15/2022 1635   PROT 7.3 10/15/2019 0629   PROT 7.3 11/16/2014 1635   ALBUMIN 3.6 10/15/2019 0629   AST 20 10/15/2019 0629   ALT 22 10/15/2019 0629   ALKPHOS 58 10/15/2019 0629   BILITOT 0.4 10/15/2019 0629   GFRNONAA >60 10/15/2019 0629   GFRAA >60 10/15/2019 0629   Lab Results  Component Value Date   CHOL 126 09/01/2014   HDL 34.00 (L) 09/01/2014   LDLCALC 71 09/01/2014   TRIG 103.0 09/01/2014   CHOLHDL 4 09/01/2014       ASSESSMENT AND PLAN  Polyneuropathy  Chronic pain syndrome  Lumbosacral radiculopathy at S1  Dysesthesia  Erectile dysfunction, unspecified erectile dysfunction type   1.   The dysesthetic pain is fairly well-controlled.  We will continue lamotrigine  150 mg p.o. twice daily and gabapentin  300 mg 3 times a day for the dysesthetic neuropathic pain.   2.   Continue and renew Hydrocodone  1-2 at bedtime for neuropathy and back pain.    45 pills needs to last 30 days.   NCCSRS reviewed and he is compliant.   He has never had drug seeking behavior.   3.   Continue Viagra  as needed for ED 4.   He will be having knee surgery soon.  rtc 6 mos or sooner  if  new or worsening symptoms  This visit is part of a comprehensive longitudinal care medical relationship regarding the patients primary diagnosis of neuropathy and related concerns.   Orland Visconti A. Vear, MD, PhD 11/19/2023, 8:48 AM Certified in Neurology, Clinical Neurophysiology, Sleep  Medicine, Pain Medicine and Neuroimaging  [facility] [address] [place], [place] [address] [phone] =

## 2023-12-16 ENCOUNTER — Telehealth: Payer: Self-pay | Admitting: Internal Medicine

## 2023-12-16 MED ORDER — LISINOPRIL 20 MG PO TABS: 20.0000 mg | ORAL_TABLET | Freq: Two times a day (BID) | ORAL | 0 refills | Status: DC

## 2023-12-16 NOTE — Telephone Encounter (Signed)
*  STAT* If patient is at the pharmacy, call can be transferred to refill team.   1. Which medications need to be refilled? (please list name of each medication and dose if known)   lisinopril  (ZESTRIL ) 20 MG tablet    2. Which pharmacy/location (including street and city if local pharmacy) is medication to be sent to?  [id number] - [place], [place] - [address]      3. Do they need a 30 day or 90 day supply? 90 day    Pt is out of medication

## 2023-12-16 NOTE — Telephone Encounter (Signed)
 Pt's medication was sent to pt's pharmacy as requested. Confirmation received.

## 2023-12-17 ENCOUNTER — Other Ambulatory Visit: Payer: Self-pay

## 2023-12-17 MED ORDER — GABAPENTIN 300 MG PO CAPS: ORAL_CAPSULE | ORAL | 2 refills | Status: AC

## 2023-12-18 ENCOUNTER — Ambulatory Visit: Payer: PRIVATE HEALTH INSURANCE | Attending: Internal Medicine | Admitting: Internal Medicine

## 2023-12-18 ENCOUNTER — Encounter: Payer: Self-pay | Admitting: Internal Medicine

## 2023-12-18 VITALS — BP 134/76 | HR 66 | Ht 70.0 in | Wt 221.0 lb

## 2023-12-18 DIAGNOSIS — Z01810 Encounter for preprocedural cardiovascular examination: Secondary | ICD-10-CM

## 2023-12-18 DIAGNOSIS — E782 Mixed hyperlipidemia: Secondary | ICD-10-CM

## 2023-12-18 DIAGNOSIS — I10 Essential (primary) hypertension: Secondary | ICD-10-CM | POA: Diagnosis not present

## 2023-12-18 NOTE — Patient Instructions (Signed)
 Medication Instructions:  The current medical regimen is effective;  continue present plan and medications.  *If you need a refill on your cardiac medications before your next appointment, please call your pharmacy*  Follow-Up: At [facility], you and your health needs are our priority.  As part of our continuing mission to provide you with exceptional heart care, our providers are all part of one team.  This team includes your primary Cardiologist (physician) and Advanced Practice Providers or APPs (Physician Assistants and Nurse Practitioners) who all work together to provide you with the care you need, when you need it.  Your next appointment:   1 year(s)  Provider:   One of our Advanced Practice Providers (APPs): Melita Springer, PA-C  Friddie Jetty, NP Evaline Hill, NP  Theotis Flake, PA-C Lawana Pray, NP  Willis Harter, PA-C Lovette Rud, PA-C  [place], PA-C Ernest Dick, NP  Marlana Silvan, NP Marcie Sever, PA-C  Laquita Plant, PA-C    Dayna Dunn, PA-C  Scott Weaver, PA-C Palmer Bobo, NP Katlyn West, NP Callie Goodrich, PA-C  Evan Williams, PA-C Sheng Haley, PA-C  Xika Zhao, NP Kathleen Johnson, PA-C       We recommend signing up for the patient portal called "MyChart".  Sign up information is provided on this After Visit Summary.  MyChart is used to connect with patients for Virtual Visits (Telemedicine).  Patients are able to view lab/test results, encounter notes, upcoming appointments, etc.  Non-urgent messages can be sent to your provider as well.   To learn more about what you can do with MyChart, go to [email or web address].

## 2023-12-18 NOTE — Progress Notes (Signed)
 Cardiology Office Note   Date:  12/18/2023  ID:  Jeffrey Good, DOB 1951/01/29, MRN [id number] PCP: Sun, Vyvyan, MD   HeartCare Providers Cardiologist:  Emeline FORBES Calender, MD     History of Present Illness Jeffrey Good is a 73 y.o. male with a past medical history of hypertension and hyperlipidemia, polyneuropathy, ruptured appendicitis with abscess in 2021 and presents today for annual follow-up.  Overall he is doing very well.  He has been tolerating his medications and has not had any new complaints over the past year with exception of some knee pain.  In fact, he is hoping to get knee surgery and is here to discuss preop cardiovascular risk assessment and is hoping to take the records to a program in order to get knee surgery.  Patient states that since the beginning of the year he has had significant knee pain and this has limited his mobility.  Prior to that time he was able to walk 2 miles per day without any issues.  He also states that his wife is paraplegic and he is the main caretaker.  He bathes her and dresses her daily and denies any chest pain or shortness of breath with this activity.  He reports a former history of tobacco use in his late teen years but quit when he was 39.  Denies any history of stroke or kidney disease.  Denies use of insulin.   ROS:  Review of Systems  All other systems reviewed and are negative.   Physical Exam  Physical Exam Vitals and nursing note reviewed.  Constitutional:      Appearance: Normal appearance.  HENT:     Head: Normocephalic and atraumatic.  Eyes:     Conjunctiva/sclera: Conjunctivae normal.  Neck:     Vascular: No carotid bruit.  Cardiovascular:     Rate and Rhythm: Normal rate and regular rhythm.  Pulmonary:     Effort: Pulmonary effort is normal.     Breath sounds: Normal breath sounds.  Musculoskeletal:        General: No swelling or tenderness.  Skin:    Coloration: Skin is not jaundiced or pale.   Neurological:     Mental Status: He is alert. Mental status is at baseline.  Psychiatric:        Mood and Affect: Mood normal.        Behavior: Behavior normal.     VS:  BP 134/76   Pulse 66   Ht 5' 10 (1.778 m)   Wt 221 lb (100.2 kg)   SpO2 96%   BMI 31.71 kg/m         Wt Readings from Last 3 Encounters:  12/18/23 221 lb (100.2 kg)  11/19/23 220 lb 8 oz (100 kg)  12/12/22 214 lb 8 oz (97.3 kg)     EKG Interpretation Date/Time:  Wednesday December 18 2023 16:01:09 EDT Ventricular Rate:  68 PR Interval:  200 QRS Duration:  92 QT Interval:  368 QTC Calculation: 391 R Axis:   -57  Text Interpretation: Normal sinus rhythm Left anterior fascicular block When compared with ECG of 15-Oct-2022 16:08, No significant change was found Confirmed by Calender Emeline [phone]) on 12/18/2023 4:05:38 PM    Studies Reviewed     Risk Assessment/Calculations              ASSESSMENT  Preop cardiovascular risk assessment patient is planning to discuss potentially having knee surgery.  This is not scheduled yet.  He is  able to bathe and dress his wife who is paraplegic without any exertional symptoms and was able to walk 2 miles daily last year before his knee started bothering him.  RCRI: 0 however no recent lab work for a recent creatinine. Hypertension on carvedilol  25 mg twice daily, lisinopril  20 mg daily, spironolactone  25 mg daily Hyperlipidemia  Plan  Patient is at low risk of perioperative MACE for the anticipated intermediate risk procedure.  He does not require any preoperative cardiovascular testing Will defer hypertension and hyperlipidemia management to PCP  Follow up: Follow-up in 1 year with a PA         Signed, Emeline FORBES Calender, MD

## 2023-12-26 ENCOUNTER — Other Ambulatory Visit: Payer: Self-pay | Admitting: Neurology

## 2023-12-26 MED ORDER — HYDROCODONE-ACETAMINOPHEN 5-325 MG PO TABS: 1.0000 | ORAL_TABLET | Freq: Every day | ORAL | 0 refills | Status: DC | PRN

## 2023-12-26 NOTE — Telephone Encounter (Signed)
 Pt called to request medication refill  HYDROcodone -acetaminophen  (NORCO/VICODIN) 5-325 MG tablet   Pt would like  medication sent to    [id number] - [place], [place] - [address] (Ph: [phone])

## 2023-12-26 NOTE — Telephone Encounter (Signed)
 Dr. Margaret you are work in provider for the afternoon (Dr.Sater is out of the office) Last seen on 11/19/23 Follow up scheduled on 06/08/24   Dispensed Days Supply Quantity Provider Pharmacy  HYDROCODONE -ACETAMIN 5-325 MG 11/26/2023 30 45 each Sater, Charlie LABOR, MD [phone] [facility] [place]     Rx pending to be signed

## 2024-01-20 ENCOUNTER — Telehealth: Payer: Self-pay | Admitting: Internal Medicine

## 2024-01-20 MED ORDER — SPIRONOLACTONE 25 MG PO TABS: 25.0000 mg | ORAL_TABLET | Freq: Every day | ORAL | 3 refills | Status: AC

## 2024-01-20 MED ORDER — LISINOPRIL 20 MG PO TABS: 20.0000 mg | ORAL_TABLET | Freq: Two times a day (BID) | ORAL | 3 refills | Status: AC

## 2024-01-20 NOTE — Telephone Encounter (Signed)
 RX sent in

## 2024-01-20 NOTE — Telephone Encounter (Signed)
*  STAT* If patient is at the pharmacy, call can be transferred to refill team.   1. Which medications need to be refilled? (please list name of each medication and dose if known) lisinopril  (ZESTRIL ) 20 MG tablet  spironolactone  (ALDACTONE ) 25 MG tablet   2. Would you like to learn more about the convenience, safety, & potential cost savings by using the [facility]?    3. Are you open to using the [facility] (Type [facility]. ).   4. Which pharmacy/location (including street and city if local pharmacy) is medication to be sent to? [id number] [place] - [address]    5. Do they need a 30 day or 90 day supply? 90 day

## 2024-01-24 ENCOUNTER — Other Ambulatory Visit: Payer: Self-pay | Admitting: Neurology

## 2024-01-24 ENCOUNTER — Other Ambulatory Visit: Payer: Self-pay | Admitting: *Deleted

## 2024-01-24 MED ORDER — HYDROCODONE-ACETAMINOPHEN 5-325 MG PO TABS: 1.0000 | ORAL_TABLET | Freq: Every day | ORAL | 0 refills | Status: DC | PRN

## 2024-01-24 NOTE — Telephone Encounter (Signed)
 Pt called needing a refill request for his HYDROcodone -acetaminophen  (NORCO/VICODIN) 5-[facility] MG [facility] [address]

## 2024-01-24 NOTE — Telephone Encounter (Signed)
 Last refill 12-26-2023, last seen 11-19-2023, nrxt appt 06/08/2024

## 2024-01-27 NOTE — Telephone Encounter (Signed)
Rx was approved.

## 2024-02-24 ENCOUNTER — Other Ambulatory Visit: Payer: Self-pay

## 2024-02-26 MED ORDER — DOXAZOSIN MESYLATE 2 MG PO TABS
4.0000 mg | ORAL_TABLET | Freq: Every day | ORAL | 3 refills | Status: AC
Start: 2024-02-26 — End: ?

## 2024-02-27 ENCOUNTER — Other Ambulatory Visit: Payer: Self-pay | Admitting: Neurology

## 2024-02-27 MED ORDER — HYDROCODONE-ACETAMINOPHEN 5-325 MG PO TABS
1.0000 | ORAL_TABLET | Freq: Every day | ORAL | 0 refills | Status: AC | PRN
Start: 2024-02-27 — End: ?

## 2024-02-27 NOTE — Telephone Encounter (Signed)
Pt called needing a refill request for his HYDROcodone-acetaminophen (NORCO/VICODIN) 5-325 MG tablet [facility] [address]

## 2024-03-25 ENCOUNTER — Other Ambulatory Visit: Payer: Self-pay | Admitting: Neurology

## 2024-03-25 MED ORDER — HYDROCODONE-ACETAMINOPHEN 5-325 MG PO TABS: 1.0000 | ORAL_TABLET | Freq: Every day | ORAL | 0 refills | Status: DC | PRN

## 2024-03-25 NOTE — Telephone Encounter (Signed)
 Requested Prescriptions   Pending Prescriptions Disp Refills   HYDROcodone -acetaminophen  (NORCO/VICODIN) 5-325 MG tablet 45 tablet 0    Sig: Take 1-2 tablets by mouth daily as needed for moderate pain (pain score 4-6).   Last 11/19/23 Next appt   Dispenses   Dispensed Days Supply Quantity Provider Pharmacy  HYDROCOD/ACETAM 5-325MG  TAB 02/28/2024 23 45 each Sater, Charlie LABOR, MD [facility] [phone] ...  HYDROCODONE -ACETAMIN 5-325 MG 01/24/2024 30 45 each Penumalli, Eduard SAUNDERS, MD [facility] [phone] - [place]...  HYDROCODONE -ACETAMIN 5-325 MG 12/26/2023 30 45 each Penumalli, Vikram R, MD [facility] [phone] - [place]...  HYDROCODONE -ACETAMIN 5-325 MG 11/26/2023 30 45 each Sater, Charlie LABOR, MD [facility] [phone] - [place]...  HYDROCODONE -ACETAMIN 5-325 MG 10/27/2023 30 45 each Sater, Charlie LABOR, MD [facility] [phone] - [place]...  HYDROCODONE -ACETAMIN 5-325 MG 09/25/2023 30 45 each Sater, Charlie LABOR, MD [facility] [phone] - [place]...  HYDROCODONE -ACETAMIN 5-325 MG 08/26/2023 30 45 each Sater, Charlie LABOR, MD [facility] [phone] - [place]...  HYDROCODONE -ACETAMIN 5-325 MG 07/25/2023 30 45 each Sater, Charlie LABOR, MD [facility] [phone] - [place]...  HYDROCODONE -ACETAMIN 5-325 MG 06/25/2023 30 45 each Sater, Charlie LABOR, MD [facility] [phone] - [place]...  HYDROCODONE -ACETAMIN 5-325 MG 05/27/2023 30 45 each Sater, Charlie LABOR, MD [facility] [phone] - [place]...  HYDROCODONE -ACETAMIN 5-325 MG 04/23/2023 22 45 each Sater, Charlie LABOR, MD [facility] [phone] [facility] [place]

## 2024-03-25 NOTE — Telephone Encounter (Signed)
 Pt is needing a refill on his HYDROcodone -acetaminophen  (NORCO/VICODIN) 5-325 MG tablet and is needing it sent to the [facility] on [address]

## 2024-04-20 ENCOUNTER — Telehealth: Payer: Self-pay | Admitting: Neurology

## 2024-04-20 NOTE — Telephone Encounter (Signed)
 Patient calling with new insurance information: Jeffrey Good [id number] Group [phone]

## 2024-04-22 ENCOUNTER — Telehealth: Payer: Self-pay | Admitting: Internal Medicine

## 2024-04-22 NOTE — Telephone Encounter (Signed)
*  STAT* If patient is at the pharmacy, call can be transferred to refill team.   1. Which medications need to be refilled? (please list name of each medication and dose if known)  carvedilol (COREG) 25 MG tablet  2. Which pharmacy/location (including street and city if local pharmacy) is medication to be sent to? [id number] [place], [place].  3. Do they need a 30 day or 90 day supply? [place]

## 2024-04-24 ENCOUNTER — Telehealth: Payer: Self-pay | Admitting: Internal Medicine

## 2024-04-24 DIAGNOSIS — Z0279 Encounter for issue of other medical certificate: Secondary | ICD-10-CM

## 2024-04-24 NOTE — Telephone Encounter (Signed)
 Patient brought in a D.O.T. form from [employer]. Patient signed the Release of Information and paid $29 fee.  Form in Dr. Ali box.

## 2024-04-27 NOTE — Telephone Encounter (Signed)
 Completed form faxed to [employer].  Billing notified.

## 2024-04-29 MED ORDER — CARVEDILOL 25 MG PO TABS: 25.0000 mg | ORAL_TABLET | Freq: Two times a day (BID) | ORAL | 2 refills | Status: AC

## 2024-04-29 NOTE — Telephone Encounter (Signed)
 Refill sent

## 2024-05-05 ENCOUNTER — Telehealth: Payer: Self-pay | Admitting: Neurology

## 2024-05-05 NOTE — Telephone Encounter (Signed)
Pt is needing a refill on his HYDROcodone-acetaminophen (NORCO/VICODIN) 5-325 MG tablet and is needing it sent to the [facility] on [address].

## 2024-05-06 MED ORDER — HYDROCODONE-ACETAMINOPHEN 5-325 MG PO TABS: 1.0000 | ORAL_TABLET | Freq: Every day | ORAL | 0 refills | Status: AC | PRN

## 2024-05-06 NOTE — Telephone Encounter (Signed)
 Requested Prescriptions   Pending Prescriptions Disp Refills   HYDROcodone -acetaminophen  (NORCO/VICODIN) 5-325 MG tablet 45 tablet 0    Sig: Take 1-2 tablets by mouth daily as needed for moderate pain (pain score 4-6).   Last seen 11/19/23 Next appt 06/08/24 Dispenses   Dispensed Days Supply Quantity Provider Pharmacy  HYDROCOD/ACETAM 5-325MG  TAB 03/25/2024 30 45 each Sater, Charlie LABOR, MD [facility] [phone] ...  HYDROCOD/ACETAM 5-325MG  TAB 02/28/2024 23 45 each Sater, Charlie LABOR, MD [facility] [phone] ...  HYDROCODONE -ACETAMIN 5-325 MG 01/24/2024 30 45 each Penumalli, Eduard SAUNDERS, MD [facility] [phone] [place]  HYDROCODONE -ACETAMIN 5-325 MG 12/26/2023 30 45 each Penumalli, Vikram R, MD [facility] [phone] - [place]...  HYDROCODONE -ACETAMIN 5-325 MG 11/26/2023 30 45 each Sater, Charlie LABOR, MD [facility] [phone] - [place]...  HYDROCODONE -ACETAMIN 5-325 MG 10/27/2023 30 45 each Sater, Charlie LABOR, MD [facility] [phone] - [place]...  HYDROCODONE -ACETAMIN 5-325 MG 09/25/2023 30 45 each Sater, Charlie LABOR, MD [facility] [phone] - [place]...  HYDROCODONE -ACETAMIN 5-325 MG 08/26/2023 30 45 each Sater, Charlie LABOR, MD [facility] [phone] [facility] [place]...  HYDROCODONE -ACETAMIN 5-325 MG 07/25/2023 30 45 each Sater, Charlie LABOR, MD [facility] [phone] - [place]...  HYDROCODONE -ACETAMIN 5-325 MG 06/25/2023 30 45 each Sater, Charlie LABOR, MD [facility] [phone] - [place]...  HYDROCODONE -ACETAMIN 5-325 MG 05/27/2023 30 45 each Sater, Charlie LABOR, MD [facility] [phone] - [place]

## 2024-06-05 ENCOUNTER — Encounter (INDEPENDENT_AMBULATORY_CARE_PROVIDER_SITE_OTHER): Payer: PRIVATE HEALTH INSURANCE | Admitting: Ophthalmology

## 2024-06-08 ENCOUNTER — Ambulatory Visit: Payer: PRIVATE HEALTH INSURANCE | Admitting: Family Medicine
